# Patient Record
Sex: Male | Born: 1940 | Race: Black or African American | Hispanic: No | Marital: Married | Smoking: Current some day smoker
Health system: Southern US, Community
[De-identification: ages and names within clinical notes are randomized; demographics above are authoritative.]

## PROBLEM LIST (undated history)

## (undated) DIAGNOSIS — I2510 Atherosclerotic heart disease of native coronary artery without angina pectoris: Secondary | ICD-10-CM

## (undated) DIAGNOSIS — J40 Bronchitis, not specified as acute or chronic: Secondary | ICD-10-CM

## (undated) DIAGNOSIS — E119 Type 2 diabetes mellitus without complications: Secondary | ICD-10-CM

## (undated) DIAGNOSIS — E785 Hyperlipidemia, unspecified: Secondary | ICD-10-CM

## (undated) DIAGNOSIS — I739 Peripheral vascular disease, unspecified: Secondary | ICD-10-CM

## (undated) DIAGNOSIS — M1990 Unspecified osteoarthritis, unspecified site: Secondary | ICD-10-CM

## (undated) DIAGNOSIS — I10 Essential (primary) hypertension: Secondary | ICD-10-CM

## (undated) HISTORY — DX: Peripheral vascular disease, unspecified: I73.9

## (undated) HISTORY — DX: Bronchitis, not specified as acute or chronic: J40

## (undated) HISTORY — DX: Atherosclerotic heart disease of native coronary artery without angina pectoris: I25.10

## (undated) HISTORY — DX: Hyperlipidemia, unspecified: E78.5

## (undated) HISTORY — DX: Unspecified osteoarthritis, unspecified site: M19.90

## (undated) HISTORY — DX: Essential (primary) hypertension: I10

## (undated) HISTORY — PX: CORONARY ARTERY BYPASS GRAFT: SHX141

---

## 1999-05-29 HISTORY — PX: OTHER SURGICAL HISTORY: SHX169

## 2001-02-11 ENCOUNTER — Ambulatory Visit (HOSPITAL_COMMUNITY): Admission: RE | Admit: 2001-02-11 | Discharge: 2001-02-11 | Payer: Self-pay | Admitting: *Deleted

## 2002-03-20 ENCOUNTER — Encounter: Payer: Self-pay | Admitting: Cardiothoracic Surgery

## 2002-03-20 ENCOUNTER — Inpatient Hospital Stay (HOSPITAL_COMMUNITY): Admission: AD | Admit: 2002-03-20 | Discharge: 2002-03-26 | Payer: Self-pay | Admitting: Cardiology

## 2002-03-21 ENCOUNTER — Encounter: Payer: Self-pay | Admitting: Cardiothoracic Surgery

## 2002-03-22 ENCOUNTER — Encounter: Payer: Self-pay | Admitting: Cardiothoracic Surgery

## 2002-03-23 ENCOUNTER — Encounter: Payer: Self-pay | Admitting: Cardiothoracic Surgery

## 2002-03-24 ENCOUNTER — Encounter: Payer: Self-pay | Admitting: Cardiothoracic Surgery

## 2002-04-09 ENCOUNTER — Emergency Department (HOSPITAL_COMMUNITY): Admission: EM | Admit: 2002-04-09 | Discharge: 2002-04-09 | Payer: Self-pay | Admitting: Emergency Medicine

## 2002-04-10 ENCOUNTER — Emergency Department (HOSPITAL_COMMUNITY): Admission: EM | Admit: 2002-04-10 | Discharge: 2002-04-10 | Payer: Self-pay | Admitting: Emergency Medicine

## 2002-04-14 ENCOUNTER — Encounter: Admission: RE | Admit: 2002-04-14 | Discharge: 2002-04-14 | Payer: Self-pay | Admitting: Cardiothoracic Surgery

## 2002-04-14 ENCOUNTER — Encounter: Payer: Self-pay | Admitting: Cardiothoracic Surgery

## 2003-12-13 ENCOUNTER — Ambulatory Visit (HOSPITAL_COMMUNITY): Admission: RE | Admit: 2003-12-13 | Discharge: 2003-12-14 | Payer: Self-pay | Admitting: Cardiology

## 2007-06-29 ENCOUNTER — Encounter: Admission: RE | Admit: 2007-06-29 | Discharge: 2007-06-29 | Payer: Self-pay | Admitting: Cardiology

## 2007-07-04 ENCOUNTER — Inpatient Hospital Stay (HOSPITAL_COMMUNITY): Admission: RE | Admit: 2007-07-04 | Discharge: 2007-07-05 | Payer: Self-pay | Admitting: Cardiology

## 2008-05-01 ENCOUNTER — Encounter: Admission: RE | Admit: 2008-05-01 | Discharge: 2008-05-01 | Payer: Self-pay | Admitting: Cardiology

## 2008-05-07 ENCOUNTER — Ambulatory Visit (HOSPITAL_COMMUNITY): Admission: RE | Admit: 2008-05-07 | Discharge: 2008-05-07 | Payer: Self-pay | Admitting: Cardiology

## 2008-05-28 ENCOUNTER — Ambulatory Visit: Payer: Self-pay | Admitting: *Deleted

## 2008-05-28 ENCOUNTER — Encounter (INDEPENDENT_AMBULATORY_CARE_PROVIDER_SITE_OTHER): Payer: Self-pay | Admitting: *Deleted

## 2008-05-28 ENCOUNTER — Inpatient Hospital Stay (HOSPITAL_COMMUNITY): Admission: RE | Admit: 2008-05-28 | Discharge: 2008-05-30 | Payer: Self-pay | Admitting: *Deleted

## 2008-05-29 ENCOUNTER — Encounter (INDEPENDENT_AMBULATORY_CARE_PROVIDER_SITE_OTHER): Payer: Self-pay | Admitting: *Deleted

## 2008-06-21 ENCOUNTER — Ambulatory Visit: Payer: Self-pay | Admitting: *Deleted

## 2008-12-13 ENCOUNTER — Ambulatory Visit: Payer: Self-pay | Admitting: *Deleted

## 2009-06-13 ENCOUNTER — Ambulatory Visit: Payer: Self-pay | Admitting: Vascular Surgery

## 2010-02-19 ENCOUNTER — Ambulatory Visit: Payer: Self-pay | Admitting: Vascular Surgery

## 2010-04-03 ENCOUNTER — Encounter (INDEPENDENT_AMBULATORY_CARE_PROVIDER_SITE_OTHER): Payer: Self-pay | Admitting: *Deleted

## 2010-04-03 LAB — CONVERTED CEMR LAB
Alkaline Phosphatase: 86 units/L (ref 39–117)
BUN: 10 mg/dL (ref 6–23)
Basophils Relative: 0 % (ref 0–1)
Eosinophils Absolute: 0.1 10*3/uL (ref 0.0–0.7)
Eosinophils Relative: 1 % (ref 0–5)
Glucose, Bld: 108 mg/dL — ABNORMAL HIGH (ref 70–99)
HCT: 46 % (ref 39.0–52.0)
HDL: 42 mg/dL (ref >39)
LDL Cholesterol: 182 mg/dL — ABNORMAL HIGH (ref 0–99)
Lymphs Abs: 2.1 10*3/uL (ref 0.7–4.0)
MCHC: 33.7 g/dL (ref 30.0–36.0)
MCV: 90.7 fL (ref 78.0–100.0)
Monocytes Relative: 9 % (ref 3–12)
Platelets: 231 10*3/uL (ref 150–400)
Sodium: 141 meq/L (ref 135–145)
Total Bilirubin: 0.3 mg/dL (ref 0.3–1.2)
Triglycerides: 109 mg/dL (ref <150)
VLDL: 22 mg/dL (ref 0–40)
WBC: 5.2 10*3/uL (ref 4.0–10.5)

## 2010-08-11 LAB — COMPREHENSIVE METABOLIC PANEL
Albumin: 4.1 g/dL (ref 3.5–5.2)
BUN: 8 mg/dL (ref 6–23)
Creatinine, Ser: 1.09 mg/dL (ref 0.4–1.5)
Total Bilirubin: 0.7 mg/dL (ref 0.3–1.2)
Total Protein: 7.2 g/dL (ref 6.0–8.3)

## 2010-08-11 LAB — URINALYSIS, ROUTINE W REFLEX MICROSCOPIC
Bilirubin Urine: NEGATIVE
Nitrite: NEGATIVE
Specific Gravity, Urine: 1.011 (ref 1.005–1.030)
Urobilinogen, UA: 1 mg/dL (ref 0.0–1.0)

## 2010-08-11 LAB — CBC
HCT: 41.5 % (ref 39.0–52.0)
MCHC: 33.1 g/dL (ref 30.0–36.0)
MCV: 92.8 fL (ref 78.0–100.0)
Platelets: 214 10*3/uL (ref 150–400)
RDW: 14.4 % (ref 11.5–15.5)

## 2010-08-11 LAB — TYPE AND SCREEN: Antibody Screen: NEGATIVE

## 2010-08-11 LAB — PROTIME-INR: INR: 1 (ref 0.00–1.49)

## 2010-08-11 LAB — APTT: aPTT: 32 seconds (ref 24–37)

## 2010-08-12 LAB — GLUCOSE, CAPILLARY
Glucose-Capillary: 101 mg/dL — ABNORMAL HIGH (ref 70–99)
Glucose-Capillary: 108 mg/dL — ABNORMAL HIGH (ref 70–99)
Glucose-Capillary: 86 mg/dL (ref 70–99)
Glucose-Capillary: 99 mg/dL (ref 70–99)

## 2010-08-12 LAB — CBC
HCT: 33.5 % — ABNORMAL LOW (ref 39.0–52.0)
Hemoglobin: 10.7 g/dL — ABNORMAL LOW (ref 13.0–17.0)
Platelets: 176 10*3/uL (ref 150–400)
RBC: 3.44 MIL/uL — ABNORMAL LOW (ref 4.22–5.81)
WBC: 10.9 10*3/uL — ABNORMAL HIGH (ref 4.0–10.5)
WBC: 12.4 10*3/uL — ABNORMAL HIGH (ref 4.0–10.5)

## 2010-08-12 LAB — BASIC METABOLIC PANEL
Calcium: 7.9 mg/dL — ABNORMAL LOW (ref 8.4–10.5)
GFR calc Af Amer: >60 mL/min (ref >60)
GFR calc non Af Amer: >60 mL/min (ref >60)
Sodium: 134 mEq/L — ABNORMAL LOW (ref 135–145)

## 2010-08-12 LAB — HEMOGLOBIN A1C: Mean Plasma Glucose: 146 mg/dL

## 2010-08-20 ENCOUNTER — Encounter (INDEPENDENT_AMBULATORY_CARE_PROVIDER_SITE_OTHER): Payer: PRIVATE HEALTH INSURANCE

## 2010-08-20 DIAGNOSIS — Z48812 Encounter for surgical aftercare following surgery on the circulatory system: Secondary | ICD-10-CM

## 2010-08-20 DIAGNOSIS — I739 Peripheral vascular disease, unspecified: Secondary | ICD-10-CM

## 2010-09-09 NOTE — Procedures (Signed)
BYPASS GRAFT EVALUATION   INDICATION:  Followup fem-fem bypass graft.   HISTORY:  Diabetes:  Yes.  Cardiac:  CABG.  Hypertension:  Yes.  Smoking:  Yes.  Previous Surgery:  Femoral-femoral (right to left) bypass graft and left  profundoplasty 05/28/2008 by Dr. Madilyn Fireman.   SINGLE LEVEL ARTERIAL EXAM                               RIGHT              LEFT  Brachial:                    136                137  Anterior tibial:             76                 69  Posterior tibial:            86                 70  Peroneal:  Ankle/brachial index:        0.63               0.51   PREVIOUS ABI:  Date:  12/13/2008  RIGHT:  0.63  LEFT:  0.53   LOWER EXTREMITY BYPASS GRAFT DUPLEX EXAM:   DUPLEX:  Patent femoral-femoral bypass graft with elevated velocities in  the right CFA.  Evidence of bilateral SFA occlusions.   IMPRESSION:  1. Patent right to left femoral-femoral bypass graft.  2. Elevated velocities in right common femoral artery.  3. Evidence of bilateral superficial femoral artery occlusions.  4. Stable ankle brachial indices bilaterally.  5. Dr. Madilyn Fireman stated to keep on standard protocol.   ___________________________________________  Di Kindle. Edilia Bo, M.D.   AS/MEDQ  D:  12/13/2008  T:  12/13/2008  Job:  [id number]

## 2010-09-09 NOTE — Op Note (Signed)
NAMECARVEL, HUSKINS                ACCOUNT NO.:  [id number]   MEDICAL RECORD NO.:  [id number]          PATIENT TYPE:  INP   LOCATION:  3308                         FACILITY:  [facility]   PHYSICIAN:  Balinda Quails, M.D.    DATE OF BIRTH:  1940-08-10   DATE OF PROCEDURE:  05/28/2008  DATE OF DISCHARGE:                               OPERATIVE REPORT   SURGEON:  Balinda Quails, MD   ASSISTANT:  Jerold Coombe, PA   ANESTHETIC:  General endotracheal.   ANESTHESIOLOGIST:  Bedelia Person, MD   PREOPERATIVE DIAGNOSIS:  Left lower extremity claudication.   POSTOPERATIVE DIAGNOSIS:  Left lower extremity claudication.   PROCEDURE:  Right-to-left femoral-femoral bypass with left  profundoplasty.   CLINICAL NOTE:  Adger Cantera is a 70 year old male with known peripheral  vascular disease.  He has history of bilateral iliac stenting  procedures.  Now has an occluded left iliac system.  Severe claudication  of left lower extremity.  Brought to the operating room at this time for  planned right-to-left femoral-femoral bypass.   OPERATIVE PROCEDURE:  The patient was brought to the operating room  stable condition.  Placed under general endotracheal anesthesia.  In a  supine position, the abdomen and both legs were prepped and draped in  sterile fashion.   Bilateral longitudinal groin skin incisions were made at the inguinal  ligament.  Dissection was carried down through the subcutaneous tissue  using electrocautery.  The lymphatics were divided electrocautery.   Right groin dissection revealed the common femoral artery to be  moderately diseased with calcified plaque, strong pulse.  The common  femoral artery encircled at the inguinal ligament.  Distal dissection  carried down to the origin of the profunda and superficial femoral  arteries which were freed and encircled with vessel loops.   The left groin revealed an absent femoral pulse.  Left common femoral  artery severely diseased.   Dissection carried down to expose a bifid  profunda system.  The 2 profunda branches were both dissected out and  encircled with vessel loops.   A suprapubic subcutaneous tunnel created between the 2 incisions.  A 7-  mm Dacron graft was placed through the tunnel.   The patient was administered 5000 units of heparin intravenously.  The  right femoral vessels were controlled with clamps.  Longitudinal  arteriotomy was made in the distal right common femoral artery.  The  arteriotomy extended out across the origin of the right superficial  femoral artery.  The graft was beveled and anastomosed end-to-side to  the right common femoral artery using running 5-0 Prolene suture.  At  the completion of this, graft flushed, clamps removed, excellent flow  present, and controlled with a fistula clamp.   Left femoral vessels were then controlled with clamps.  The superficial  branch of left profunda femoris artery was opened longitudinally.  The  arteriotomy extended proximally up into the common femoral artery.  Severe plaque was present and this was endarterectomized back to the  second branch of the profunda femoris artery.  Both major branches of  profunda  femoris were therefore open.  The left limb of the graft was  beveled and anastomosed end-to-side to left common femoral artery out on  the left profunda femoris artery using running 6-0 Prolene suture.  At  the completion of this, the graft flushed and the vessels flushed.  Clamps were then removed.  Excellent flow present.   Adequate hemostasis was obtained.  Sponge and instrument counts were  correct.   The groin incisions were then closed with running 2-0 Vicryl suture in  two subcutaneous layers, skin closed with Monocryl and Dermabond.   No apparent complications.  The patient transferred to recovery room in  stable condition.      Balinda Quails, M.D.  Electronically Signed     PGH/MEDQ  D:  05/28/2008  T:  05/28/2008   Job:  [id number]   cc:   [name] [name], MD

## 2010-09-09 NOTE — Discharge Summary (Signed)
NAMEBAWI, LAKINS                ACCOUNT NO.:  [id number]   MEDICAL RECORD NO.:  [id number]          PATIENT TYPE:  INP   LOCATION:  2028                         FACILITY:  [facility]   PHYSICIAN:  Balinda Quails, M.D.    DATE OF BIRTH:  1940/11/24   DATE OF ADMISSION:  05/28/2008  DATE OF DISCHARGE:  05/30/2008                               DISCHARGE SUMMARY   FINAL DISCHARGE DIAGNOSES:  1. Peripheral vascular disease resulting in ischemic left lower      extremity with claudication.  2. Hypertension.  3. Dyslipidemia.   PROCEDURES PERFORMED:  Right-to-left femoral-femoral artery bypass  grafting using 7-mm Hemashield Dacron graft with left profundoplasty by  Dr. Madilyn Fireman on May 28, 2008.   COMPLICATIONS:  None.   CONDITION AT DISCHARGE:  Stable and improving.   DISCHARGE MEDICATIONS:  He is instructed to resume all previous  medications consisting of:  1. Lipitor 80 mg p.o. daily.  2. Terazosin 5 mg p.o. daily.  3. Lisinopril 10 mg p.o. daily.  4. Coreg 25 mg p.o. b.i.d.  5. Plavix 75 mg p.o. daily.  6. Aspirin 325 mg p.o. daily.  7. Benicar/hydrochlorothiazide 40/12.5 mg p.o. daily.  8. Nexium 40 mg p.o. daily.   He is also given a prescription for:  1. Percocet 5/325 one p.o. q.4 h. p.r.n. pain, total #20 were given.   DISPOSITION:  He is being discharged home in stable condition with his  wound healing well.  He is given careful instructions regarding his diet  and activity level and the care of his wounds.  He is given an  appointment with Dr. Madilyn Fireman in 2 weeks for followup and ABIs.  The office  will arrange a visit.   BRIEF IDENTIFYING STATEMENT:  For complete details, please refer the  typed history and physical.  Briefly, this very pleasant 70 year old  gentleman was referred to Dr. Madilyn Fireman for left lower extremity  claudication.  Dr. Madilyn Fireman evaluated him and recommended a right-to-left  femoral-femoral bypass grafting.  He was informed of the risks and  benefits  of the procedure and after careful consideration elected to  proceed with surgery.   HOSPITAL COURSE:  Preoperative workup was completed as an outpatient.  He was brought in through the same day surgery and underwent the  aforementioned revascularization procedure.  For complete details,  please refer the typed operative report.  The procedure was without  complications, and he was returned to the post anesthesia care unit  extubated.  Following stabilization, he was transferred to a bed on a  surgical step-down unit.  The following morning, he was felt stable and  was able to be transferred to a bed on a surgical convalescent floor.   His diet and activity level was increased as tolerated.  He was walking  and performing his activities of daily living.  He was felt stable and  was discharged home in stable condition on May 30, 2008.      Wilmon Arms, PA      P. Liliane Bade, M.D.  Electronically Signed    KEL/MEDQ  D:  05/30/2008  T:  05/30/2008  Job:  [id number]

## 2010-09-09 NOTE — Cardiovascular Report (Signed)
Cristian Hayden, Cristian Hayden                ACCOUNT NO.:  [id number]   MEDICAL RECORD NO.:  [id number]          PATIENT TYPE:  INP   LOCATION:  2807                         FACILITY:  [facility]   PHYSICIAN:  [name] [name], MD       DATE OF BIRTH:  [birth year]   DATE OF PROCEDURE:  07/04/2007  DATE OF DISCHARGE:                            CARDIAC CATHETERIZATION   PROCEDURE:  1. Abdominal aortogram.  2. Distal abdominal aortogram with bifemoral runoff.  3. PTA and balloon angioplasty and stenting of the left common and      left external iliac artery.  4. Left iliac arteriogram.   INDICATIONS:  [name] is a 70 year old gentleman with known  prior coronary artery disease and history of known peripheral arterial  disease.  He has previously undergone bypass surgery in 2003 and also in  2003 was found to have bilateral SFA occlusions and high-grade stenosis  of the iliac arteries.  He was being medically managed, but because of  increasing symptoms of claudication and also significant change in his  ABI, he is brought back to the catheterization lab to evaluate his  peripheral anatomy.  His ABI on the left was 0.4 and right was 0.5.   ABDOMINAL AORTOGRAM:  Abdominal aortogram revealed the presence of two  renal arteries, one on either side.  They were widely patent.  The  aortoiliac bifurcation was widely patent.   DISTAL ABDOMINAL AORTOGRAM:  Distal abdominal aortogram revealed the  iliac artery on the left to have a high-grade 80% stenosis, long  segment.  This was followed by a 90% focal stenosis of the distal  external iliac artery.  The left common femoral artery and the profunda  femoral artery showed a 70-80% stenosis.  There was a very large  collateral that arose just at the distal external iliac artery and  supplied a large part of the hip joint.   The left profunda itself gave a collateral to the lower extremities, and  the left SFA is occluded followed by a skip occlusion of  the popliteal  artery and reconstitution by means of collateral.  There appeared to be  two-vessel runoff noted in the left lower extremity below the left knee.   The right iliac artery, right common iliac artery, and internal iliac  artery were patent.  The right external iliac artery showed diffuse  luminal irregularity, and the right external iliac artery showed a 60 to  at most 70% external iliac artery stenosis which was diffusely diseased  and a 70-80% stenosis at the right profunda femoral artery.   The profunda femoral artery gave lateral to the distal SFA, and there  was three-vessel runoff noted in the right lower extremity.   INTERVENTIONAL DATA:  Successful PTA and balloon angioplasty and  stenting of the left common and external iliac artery utilizing one long  9.0 x 80-mm self-expanding Protege stent.  This stent was pre and post-  dilated with 8.0 x 80-mm Powerflex balloon at a peak of 10 atmospheres  of pressure.  Overall, the stenosis was reduced from 80-90% to  0%.   There was no compromise of either the profunda or the large collateral  branch that arose at the distal external iliac artery.  The stent did  cover this large collateral without any compromise.   RECOMMENDATIONS:  There is still a persistent left femoral artery  stenosis which is 70-80% and a left profunda stenosis of 80%.  Given his  markedly decreased flow through the left lower extremity and markedly  abnormal ABI, he will greatly benefit from patch angioplasty of the  common femoral artery and profunda femoral artery.  I have discussed  these findings with Dr. Durene Cal, and he will review the films.  If  he does feel that the patient is going to benefit, this can be done in  the elective outpatient basis at a later date.   As far as the right lower extremity is concerned, we could potentially  consider repeating his angiography to reevaluate the significance of  right external iliac artery  stenosis and the profunda stenosis and  possible either stenting or hybrid procedure, i.e., stenting and  surgical procedure can be contemplated.  We will decide this depending  on the patient's symptomatology and followup.   He will be discharged home in the morning if he remains stable.   TECHNIQUE OF THE PROCEDURE:  Under the usual sterile precautions using a  5-French left femoral arterial access, a Wholey wire was utilized and a  pigtail catheter was advanced to the abdominal aorta, and abdominal  aortogram was performed.  The same catheter was pulled back into the  distal abdominal aorta, and distal abdominal aortogram was performed.  Then a distal abdominal aortogram with bifemoral runoff was also  performed.  Using the left femoral arterial access exposing the left  external iliac artery, arterial injection was performed and follow-  through of the left femoral artery was performed on the left side.   After eventually using heparin for anticoagulation, I reviewed the  anatomy closely, and it was a very complex anatomy.  We very carefully  measured the lesion length and performed a balloon angioplasty with a  8.0 x 80-mm Powerflex balloon at 10 atmospheres pressure for 70 seconds  and 4 atmospheres of pressure for 30 seconds.  Having performed this,  angiography revealed significant dissection in the external iliac  artery, and hence, I decided to proceed with a stent implantation with a  9.0 x 80-mm Protege stent which nicely covered both the common iliac and  distal external iliac artery stenosis, and the stent was postdilated  with the 8.0 x 80-mm Powerflex balloon at 10 atmospheres of pressure in  the proximal segment and 2 atmospheres of pressure in the distal  segment, and angiography revealed excellent results.  The wire was  withdrawn, and the patient was transferred to the holding area in stable  condition.  The patient tolerated the procedure well.  No immediate   complications were noted.  end of dictation.      [name] [name], MD  Electronically Signed     JRG/MEDQ  D:  07/04/2007  T:  07/05/2007  Job:  [id number]   cc:   Renaye Rakers, M.D.

## 2010-09-09 NOTE — Cardiovascular Report (Signed)
NAME:  Cristian Hayden, Cristian Hayden                ACCOUNT NO.:  [id number]   MEDICAL RECORD NO.:  [id number]          PATIENT TYPE:  AMB   LOCATION:  SDS                          FACILITY:  [facility]   PHYSICIAN:  Vonna Kotyk R. [name], MD       DATE OF BIRTH:  [birth year]   DATE OF PROCEDURE:  05/07/2008  DATE OF DISCHARGE:  05/07/2008                            CARDIAC CATHETERIZATION   PROCEDURE PERFORMED:  1. Pelvic aortogram with bifemoral runoff.  2. Placement of a catheter tip into the left common femoral artery.  3. Right femoral arteriogram with distal runoff.   INDICATIONS:  [name] is a 70 year old gentleman with known  coronary artery disease and status post CABG.  He also has smoking  history.  He has been having severe lifestyle-limiting claudication.  His ABI had been markedly reduced more on the left than on the right and  has been having lifestyle-limiting claudication.  He had undergone PTA  and stenting of the left external and common iliac artery on July 04, 2007.  He had a high-grade stenosis of the left profunda femoral artery.  He was now brought back to the PV angiography suite to reevaluate his  peripheral anatomy given increasing symptoms of claudication and to  evaluate whether percutaneous or surgical intervention is appropriate.   ANGIOGRAPHIC DATA:  Left iliac arterial system.  The left common iliac  artery is widely patent.  The left external iliac artery stent is also  widely patent.  The left internal iliac artery is occluded.   Left common femoral artery and left profunda femoral artery which was  large, they were occluded and this is new from March 2009.  However,  this profunda is collateralized by a large collateral and the profunda  itself is very large and supplies collaterals to the this distal  superficial femoral artery.  However, this superficial femoral artery  has tandem 100% occlusions including occlusion of the left popliteal  artery.   Below the left  knee, the tibioperoneal trunk is collateralized by  profunda femoral artery and there is two-vessel runoff in the left lower  extremity and the anterior tibial arteries are occluded.   Right iliac artery and right lower extremity.  The right iliac artery  showed mild luminal irregularity, again calcification was noted.  Right  external and common femoral artery showed mild 50% to at most 60%  stenosis unchanged from prior angiography.  The right superficial  femoral artery at its origin is occluded.  The right distal SFA is  collateralized by the profunda femoral artery.  There is three-vessel  runoff noted below the right knee.   IMPRESSION AND PLAN:  [name] has progressed his peripheral arterial  disease.  Now, the left common femoral artery and left profunda femoral  artery at the origin 2-3 weeks are now occluded.  However, there is  still excellent collateralization of the profunda femoral artery and  this gives collaterals to the tibioperoneal trunk below the left knee  while there is two-vessel runoff.  On the right side, the anatomy is  unchanged with  occluded right superficial femoral artery and the right  profunda femoral artery gives collaterals to the distal superficial  femoral artery.  He would benefit from revascularization and this is a  surgical anatomy.  I will discuss the findings with Dr. Liliane Bade.  Either an aortobiprofunda bypass or is able to perform left femoral  endarterectomy and left profundoplasty that would be another ideal  situation.  However, I will discuss this with Dr. Liliane Bade and make  further recommendations.   Total of 95 mL of contrast was utilized for diagnostic angiography.   TECHNIQUE OF PROCEDURE:  Usual sterile precautions using a 5-French  right femoral arterial access, a 5-French Omniflush catheter was  advanced into abdominal and pelvic aortogram was performed.  Using the  same catheter, I was able to crossover into the left common  iliac artery  and pass the wire and a end-hole catheter was placed into the left  common femoral artery.  Angiography of the left lower extremity was  performed.  Then, the same catheter was pulled out of the body.  Right  femoral arteriography was performed through the arterial access sheath.  The patient tolerated the procedure well.  No immediate complications.      [name] [name], MD  Electronically Signed     JRG/MEDQ  D:  05/07/2008  T:  05/08/2008  Job:  [id number]   cc:   Balinda Quails, M.D.

## 2010-09-09 NOTE — Assessment & Plan Note (Signed)
OFFICE VISIT   JOVAUN, LEVENE  DOB:  [birth year]                                       06/21/2008  [id number]#:[id number]   The patient underwent right-to-left fem-fem bypass with left  profundoplasty May 28, 2008, at [facility].   He is doing well since his surgery.  No complaints at this time.  Improved left lower extremity claudication symptoms.   Ankle brachial indices 0.63 on the right, 0.53 on the left.   Incisions are healing well.  Fem-fem graft patent.   Plan follow-up per protocol.  Answered all questions at this time.   Balinda Quails, M.D.  Electronically Signed   PGH/MEDQ  D:  06/21/2008  T:  06/22/2008  Job:  [id number]   cc:   [name] [name], MD

## 2010-09-09 NOTE — Procedures (Signed)
BYPASS GRAFT EVALUATION   INDICATION:  Follow up femoral-femoral bypass graft.   HISTORY:  Diabetes:  Yes.  Cardiac:  CABG.  Hypertension:  Yes.  Smoking:  Yes.  Previous Surgery:  Right-to-left femoral-femoral bypass graft and left  profundoplasty, 05/28/08.   SINGLE LEVEL ARTERIAL EXAM                               RIGHT              LEFT  Brachial:                    159                155  Anterior tibial:             97                 76  Posterior tibial:            76                 79  Peroneal:  Ankle/brachial index:        0.61               0.50   PREVIOUS ABI:  Date:  12/13/08  RIGHT:  0.63  LEFT:  0.51   LOWER EXTREMITY BYPASS GRAFT DUPLEX EXAM:   DUPLEX:  Patent right-to-left femoral-femoral bypass graft with elevated  velocity of the right common femoral artery and right proximal  anastomosis.  Bilateral superficial femoral arteries appear occluded.   IMPRESSION:  1. Stable ankle brachial indices bilaterally.  2. Stable femoral-femoral bypass graft with elevated velocities, as      described above.  3. Occlusion of bilateral superficial femoral arteries.     ___________________________________________  Di Kindle. Edilia Bo, M.D.   CB/MEDQ  D:  06/14/2009  T:  06/14/2009  Job:  [id number]

## 2010-09-09 NOTE — Assessment & Plan Note (Signed)
OFFICE VISIT   Cristian, Hayden  DOB:  1940/11/06                                       02/19/2010  [id number]#:[id number]   I saw the patient in the office today for continued followup of his  peripheral vascular disease.  He had undergone a right to left fem-fem  bypass graft by Dr. Madilyn Fireman in February 2010.  He comes in for continued  followup.  He continues to have some mild claudication in both calves,  which is equal on both sides.  His pain is brought on by ambulation and  relieved with rest.  There are no other aggravating or alleviating  factors.  He has had no rest pain and no history of nonhealing ulcers.   PAST MEDICAL HISTORY:  Significant for hypertension which has been  poorly-controlled, as he has not been taking his blood pressure  medications.  He states that he has not been able to afford these.  In  addition, the patient has a history of hyperlipidemia.   REVIEW OF SYSTEMS:  CARDIOVASCULAR:  He has had no chest pain, chest  pressure, palpitations or arrhythmias.  He has had no history of stroke  or TIAs.  He has had no history of DVT or phlebitis.  NEUROLOGIC:  He does have occasional dizziness.  PULMONARY:  He has had no productive cough, bronchitis, asthma or  wheezing.   PHYSICAL EXAMINATION:  This is a pleasant 70 year old gentleman who  appears his stated age.  Blood pressure is 203/100, heart rate is 65,  respiratory rate is 12.  Lungs are clear bilaterally to auscultation  without rales, rhonchi or wheezing.  On cardiovascular exam I do not  detect any carotid bruits.  He has a regular rate and rhythm.  He has  palpable femoral pulses and a palpable fem-fem graft pulse.  I cannot  palpate popliteal or pedal pulses on either side.  Abdomen is soft,  nontender, with normal-pitched bowel sounds.  Neurologic:  He has no  focal weakness or paresthesias.   I have independently interpreted his arterial Doppler study, which shows  monophasic  Doppler signals in the dorsalis pedis and posterior tibial  positions bilaterally.  ABI on the right is 59% and on the left 46%.  These ABIs are stable compared to the study in February of this year.   On social history, the patient does continue to smoke a pack per week of  cigarettes.  He had smoked up to a pack per day in the past.  We again  discussed the importance of tobacco cessation.  We will follow his graft  on our protocol and I will plan on seeing him back in 18 months unless  he calls sooner.  We have also given him a number to try to get some  assistance with his medications.  I have ordered a followup Doppler  study so we can continue to follow this graft.     Di Kindle. Edilia Bo, M.D.  Electronically Signed   CSD/MEDQ  D:  02/19/2010  T:  02/20/2010  Job:  [id number]

## 2010-09-09 NOTE — Discharge Summary (Signed)
NAMERAMEEN, QUINNEY                ACCOUNT NO.:  [id number]   MEDICAL RECORD NO.:  [id number]          PATIENT TYPE:  INP   LOCATION:  3735                         FACILITY:  [facility]   PHYSICIAN:  [name] [name], MD       DATE OF BIRTH:  [birth year]   DATE OF ADMISSION:  07/04/2007  DATE OF DISCHARGE:                               DISCHARGE SUMMARY   [name] is a 70 year old African American male patient of Dr. [name],  who has claudication in his lower extremities.  He had peripheral  Dopplers January 16 that revealed ABI of 0.5 on the right, 0.4 on the  left, with marked elevated velocities.  Thus, he was brought in for PV  angiography.  It was performed by Dr. [name].  He was found to have a  high-grade stenosis in his left iliac.  He underwent stenting with a  Protege GPS 9 x 80 to the left iliac.  He had complex anatomy per Dr.  Verl Dicker preliminary results.  He recommended a left femoral and profunda  patch angioplasty and questionable right iliac stenting or further  evaluation at a later date,  he had planned to talk to Dr. Myra Gianotti about  his disease.  Please see Dr. Verl Dicker complete the PV angiogram report  for complete details.  On the morning of July 05, 2007, he was seen by  Dr. Nanetta Batty and considered stable for discharge home.  His blood  pressure was 135/77.   LABS:  Hemoglobin 12.6, hematocrit 37.1, platelets 240, WBC 7.5.  Sodium  133, potassium 3.8, BUN 6, creatinine 1.08.  His glucose was 106.  His  groin was without bruits or hematoma.   DISCHARGE MEDICATIONS:  1. Avalide 300/12.5 mg every day.  2. Lisinopril 10 mg every day.  3. Carvedilol 25 mg b.i.d.  4. Plavix 75 mg a day.  5. Lipitor 80 mg at bedtime.  6. Terazosin 5 mg every day.  7. Zegerid 40 mg every day.  8. Aspirin 81 mg every day.   He was told not to do any strenuous activity, lifting, pushing, pulling  or extended walking for 1 week.  If he has any problems with his groin,  he will give our  office a call.  Our office will call him with a follow-  up plan with Dr. [name].   DISCHARGE DIAGNOSES:  1. Claudication.  2. Abnormal ankle brachial indices and high velocities by Doppler of      his lower extremities.  3. Atherosclerotic peripheral vascular disease, status post peripheral      vascular angiogram showing complex stenosis in his iliac, femorals      and profunda.  He underwent left external iliac stenting with a      Protege 9.0 by 80 by Dr. [name].  Further evaluation will be done as      an outpatient for the rest of his disease.  4. Hypertension  5. Tobacco use.  6. Atherosclerotic cardiovascular disease, status post coronary bypass      grafting November 2003 with a left internal mammary artery to  his      left anterior descending artery, a saphenous vein graft to his      posterior      descending artery, a saphenous vein graft to his obtuse marginal #1      and obtuse marginal #2.  7. Hypertension.  8. Hyperlipidemia.      Cristian Hayden, N.P.      [name] [name], MD  Electronically Signed    BB/MEDQ  D:  07/05/2007  T:  07/06/2007  Job:  [id number]   cc:   Cristian Hayden, M.D.  Cristian Ny, MD

## 2010-09-09 NOTE — H&P (Signed)
NAME:  Cristian Hayden, Cristian Hayden                ACCOUNT NO.:  [id number]   MEDICAL RECORD NO.:  [id number]          PATIENT TYPE:  AMB   LOCATION:  SDS                          FACILITY:  [facility]   PHYSICIAN:  Vonna Kotyk R. [name], MD       DATE OF BIRTH:  [birth year]   DATE OF ADMISSION:  05/07/2008  DATE OF DISCHARGE:                              HISTORY & PHYSICAL   CHIEF COMPLAINT:  Claudication.   HISTORY OF PRESENT ILLNESS:  Cristian Hayden is a 70 year old male followed by  Dr. [name] and Dr. Renaye Rakers.  He has a history of coronary disease and  vascular disease.  He had right common internal artery stenting and  right external iliac artery angioplasty in March 2009.  He had residual  total left SFA.  He also had a 70-80% right SFA.  He continues to have  claudication.  He saw Dr. [name] in the office April 04, 2008.  His  Dopplers from November 2009 revealed an ABI of 0.54 on the right and  unobtainable on the left.  The patient's symptoms are significantly  lifestyle limiting.  He is admitted now for peripheral angiogram by Dr.  [name].  Please see Dr. Verl Dicker office note from April 04, 2008 for  complete details.   PAST MEDICAL HISTORY:  Is remarkable for:  1. Coronary disease, he had coronary artery bypass grafting x4 in 2003      with LIMA to the LAD, SVG to the PDA, SVG to the OM1 and OM-2      sequentially.  His last Myoview was October 2009, was low risk with      good LV function.  Echocardiogram in October 2009 showed an EF of      50-55% with a suggestion of impaired LV relaxation.  There was      moderate aortic valve sclerosis, but no aortic stenosis.  2. There is a history of hypertension which is treated.  \  3. He has dyslipidemia.  4. There is a mention of a history of diabetes but the patient is not      on insulin or oral agents.  5. He does have some BPH which is minimally symptomatic.  6. He has a history of glaucoma and takes drops for this.   HIS CURRENT MEDICATIONS ARE:   Lipitor 40 mg a day, bupropion 150 mg a  day, terazosin 5 mg a day, lisinopril 10 mg a day, Coreg 25 mg b.i.d.,  Zegerid, Avalide 300/12.5 daily, Plavix 75 mg a day.   He has no known drug allergies.   SOCIAL HISTORY:  He is married.  He has 4 children, 1 grandchild.  He is  a retired Estate agent.  He does smoke occasionally.   REVIEW OF SYSTEMS:  Essentially unremarkable except for as noted above.  He denies any GI bleeding or melena.  He wears glasses.   PHYSICAL EXAM:  Blood pressure 127/73, pulse 52, temperature 97.  GENERAL:  He is a well-developed, well-nourished male in no acute  distress.  HEENT:  Normocephalic, atraumatic.  Extraocular movements are intact,  sclerae are anicteric.  He wears glasses.  NECK:  Without JVD or bruit.  CHEST:  Clear to percussion and auscultation.  CARDIAC EXAM:  Reveals regular rate and rhythm with a soft systolic  murmur at the aortic valve area.  Normal S1 and S2.  ABDOMEN:  Nontender.  No hepatosplenomegaly.  No bruits.  EXTREMITIES:  Without edema, he has bifemoral bruits.  NEURO EXAM:  Grossly intact.  He is awake, alert and oriented and  cooperative and moves all extremities without obvious deficit.  SKIN:  Warm and dry.   EKG from the office shows sinus rhythm with PACs and PVCs.   LABS FROM THE OFFICE:  Show a white count 4.9, hemoglobin 13.8,  hematocrit 41.3, platelets 248.  INR is 1.  Sodium 140, potassium 3.7,  BUN 13, creatinine 1.16.   IMPRESSION:  1. Severe claudication.  2. Known peripheral vascular disease with right common iliac artery      stenting and right external iliac artery angioplasty March 2009      with residual total left SFA and 70-80% right SFA.  3. Coronary artery bypass grafting x4 November 2003, Myoview October      2009 showing no ischemia.  4. Preserved left ventricular function with evidence of diastolic      dysfunction by echocardiogram October 2009.  5. Treated hypertension.  6. Treated  dyslipidemia.  7. History of non-insulin-dependent diabetes.  8. History of glaucoma.  9. History of smoking.   PLAN:  The patient is admitted to telemetry for PV angiogram.      Abelino Derrick, P.A.      [name] [name], MD  Electronically Signed    LKK/MEDQ  D:  05/07/2008  T:  05/07/2008  Job:  [id number]

## 2010-09-12 NOTE — Op Note (Signed)
NAME:  Cristian Hayden, Cristian Hayden                          ACCOUNT NO.:  [id number]   MEDICAL RECORD NO.:  [id number]                   PATIENT TYPE:  OIB   LOCATION:  2855                                 FACILITY:  [facility]   PHYSICIAN:  [name] [name], M.D.                  DATE OF BIRTH:  [birth year]   DATE OF PROCEDURE:  01/01/2004  DATE OF DISCHARGE:  12/14/2003                                 OPERATIVE REPORT   PERIPHERAL ANGIOGRAM:   PROCEDURE PERFORMED:  1.  Abdominal aortogram.  2.  Abdominal aortogram with bilateral femoral runoff.   INDICATION:  [name] is a 70 year old gentleman with history of  coronary artery disease status post coronary artery bypass graft on March 21, 2002.  History of hypertension, hyperlipidemia, previous smoking who has  severe lifestyle-limiting claudication and markedly abnormal lower extremity  Dopplers.  Given these symptoms as per patient's request because of his  increasing symptoms of claudication he was brought to the catheterization  lab to evaluate his peripheral anatomy.   DATA:   ABDOMINAL AORTOGRAM:  Abdominal aortogram revealed presence of two renal  arteries; one on either side.  They are widely patent.  There is mild  atherosclerotic changes noted in the abdominal aorta.  The aortoiliac  bifurcation was widely patent.   LEFT ILIAC ARTERY WITH FEMORAL RUNOFF:  Left common iliac artery at the  bifurcation of its internal and external iliac has an 80-90% stenosis.  This  stenosis extends throughout the left external iliac and common femoral  artery.  This constituted anywhere from 80-90% long segmental stenosis.  The  left profunda has an ostial 90% stenosis and the left SFA is completely  occluded with recannulization just above the popliteals.  There is a very  short segment of the SFA followed by occlusion of the popliteal.  The distal  popliteal is patent and is filled by collaterals.  The left posterior tibial  is occluded and  peroneal and anterior tibial are patent.   RIGHT ILIAC ARTERY WITH FEMORAL RUNOFF:  Right common iliac artery is widely  patent.  The right external iliac artery shows 70-80% long diffuse stenosis.  This involves the right common femoral artery.  The right SFA has a 99%  proximal stenosis followed by a long segmental occlusion.  The right  profunda femoris has a 70-80% proximal stenosis.  This gives collaterals to  the distal SFA and the SFA reconstitutes just at the end of the Hunter's  canal.  There is three-vessel runoff noted in the right leg.  However, the  right anterior tibial has a proximal 80% tandem stenosis.   IMPRESSION:  1.  Long segment 80-90% stenosis of the left common iliac extending into the      left external iliac and common femoral artery.  2.  Occlusion of the left superficial femoral artery which is very long and  reconstitutes just above the popliteal artery.  The popliteal artery is      reoccluded again and this is collateralized distally by collaterals from      the profunda and also from the distal superficial femoral artery.  3.  There is two-vessel runoff noted in the left leg with patent peroneal      and anterior tibial.  4.  Long segment 70-80% stenosis of the right external iliac artery.  99%      stenosis of the proximal right superficial femoral artery followed by a      long segment occlusion of the right superficial femoral artery.  The      superficial femoral artery is collateralized by the right profunda which      has a proximal 70-80% stenosis.  There is three-vessel runoff noted in      the right leg, however, the right anterior tibial shows an 80% tandem      stenosis.   RECOMMENDATIONS:  Based on the peripheral angiogram and the anatomy, the  patient should be considered for surgical revascularization given his  severity of symptoms.  This will be discussed with the patient and options  will be again discussed with the patient.  I did  request Dr. Liliane Bade to  look at the angiograms.  Further recommendations will follow.   TECHNIQUE OF PROCEDURE:  Under usual sterile precautions, using a 5 French  right femoral arterial access, a 5 French pigtail catheter was advanced into  the abdominal aorta and abdominal aortogram was performed.  Then, the  catheter was pulled back into the lower part of the abdominal aorta.  Abdominal aortogram with bilateral iliac and femoral runoff was performed.  Then, the catheter was pulled out of the body in the usual fashion.  The  patient tolerated the procedure well.  No immediate complications noted.                                               [name] [name], M.D.    Pilar Plate  D:  01/01/2004  T:  01/01/2004  Job:  [id number]   cc:   Renaye Rakers, M.D.  [address]  [place]  [place] [address]  Fax: [phone]

## 2010-09-12 NOTE — Discharge Summary (Signed)
NAME:  Cristian Hayden, Cristian Hayden                          ACCOUNT NO.:  [id number]   MEDICAL RECORD NO.:  [id number]                   PATIENT TYPE:  INP   LOCATION:  2039                                 FACILITY:  [facility]   PHYSICIAN:  Lissa Merlin, P.A.                    DATE OF BIRTH:  29-Dec-1940   DATE OF ADMISSION:  03/20/2002  DATE OF DISCHARGE:  03/26/2002                                 DISCHARGE SUMMARY   FINAL DIAGNOSES:  1. Severe three vessel coronary artery disease with good systolic function,     ejection fraction of 57%.  2. Unstable angina with recent abnormal stress test.  3. Peripheral vascular disease with 0.7 ABI on the right and 0.6 ABI on the     left.  4. Hypertension.  5. Hyperlipidemia.  6. Gastroesophageal reflux disease.  7. Mild postoperative volume overload.   PROCEDURES:  1. Cardiac catheterization on 03/20/02.  2. CABG times four on 03/21/02 with the following grafts; LIMA to LAD,     saphenous vein graft to OM1 to OM2 sequentially, saphenous vein graft to     PDA.   BRIEF HISTORY:  This patient is a 70 year old, black male who presented with  symptoms of progressive exertional and some resting angina.  Cardiac  catheterization was done after an abnormal stress test, this showed severe  three vessel coronary artery disease.  Dr. Donata Clay was consulted and CABG  was recommended.  It was agreed to proceed and he underwent the procedure on  03/21/02, there were no complications.  Postoperatively, he was stable and  he transferred to 2000 on postoperative day two.  There were no major  complications.  He began working with cardiac rehab and was diuresed for  volume excess and edema.  Postoperative day four, he was doing very well,  walking around the unit.  He was in sinus rhythm, afebrile.  The next day,  03/26/02, he continued to do very well.  He was afebrile, in sinus rhythm,  96 on room air.  Physical exam was satisfactory.  The wounds were healing  well.   He was discharged home in stable condition.   MEDICATIONS:  1. Lopressor 25 mg b.i.d.  2. Lipitor 2 mg daily.  3. Lasix 40 mg daily for five days.  4. Kay Ciel 20 mEq daily for five days.  5. Altace 2.5 mg daily.  6. Prevacid 30 mg daily.  7. Ultram 50 mg, one to two every four to six hours p.r.n. for pain.   SPECIAL INSTRUCTIONS:  No driving, heavy lifting or strenuous activities.  No working.  He was told to use his incentive spirometer daily and to walk  daily.  He was told to shower and to clean his wounds gently daily with soap  and water.  He was told to get a chest x-ray at [facility]  one hour  before he saw Dr. Donata Clay.   CONDITION:  Stable.   DISPOSITION:  Home.   FOLLOWUP:  1. Dr. [name] two weeks after discharge.  2. Dr. Donata Clay three weeks after discharge.                                               Lissa Merlin, P.A.    JL/MEDQ  D:  05/22/2002  T:  05/22/2002  Job:  [id number]   cc:   [name] [name], M.D.  [address]  [place]  [place] [address]  Fax: [phone]   Renaye Rakers, M.D.  [phone] [address]  [place]  [place] [address]  Fax: [phone]

## 2010-09-12 NOTE — Consult Note (Signed)
NAME:  Cristian Hayden, Cristian Hayden NO.:  [id number]   MEDICAL RECORD NO.:  [id number]                   PATIENT TYPE:  INP   LOCATION:  2029                                 FACILITY:  [facility]   PHYSICIAN:  Kerin Perna III, M.D.           DATE OF BIRTH:  May 12, 1940   DATE OF CONSULTATION:  03/20/2002  DATE OF DISCHARGE:                                   CONSULTATION   REQUESTING PHYSICIAN:  Vonna Kotyk R. [name], M.D.   PRIMARY CARE PHYSICIAN:  Renaye Rakers, M.D.   REASON FOR CONSULTATION:  Left main and three vessel coronary artery  disease.   CHIEF COMPLAINT:  Chest pain.   HISTORY OF PRESENT ILLNESS:  I was asked to evaluate this 69 year old  African-American male for potential surgical coronary revascularization for  recently diagnosed left main and three vessel coronary artery disease.  The  patient has had recent episodes of exertional chest pain.  It has been  evaluated by Dr. [name].  A stress test was performed which was positive for  ischemia in the lateral region of the left ventricle.  He underwent cardiac  catheterization today which demonstrated a 90% left main stenosis with  occlusion of the distal right coronary artery, as well as 70% stenosis of  the proximal left anterior descending, obtuse marginal #1, and obtuse  marginal #2.  His ejection fraction was 55 to 60%, and was felt to be a  candidate for surgical coronary revascularization.  Prior to  catheterization, a chest wall 2-D echocardiogram showed left ventricular  hypertrophy, no mitral regurgitation, no aortic stenosis.  The patient is  currently stable on heparin.  He has been previously treated with Plavix,  Imdur, Toprol for his coronary artery disease.   PAST MEDICAL HISTORY:  1. Hypertension.  2. Hyperlipidemia.  3. Gastroesophageal reflux disease.   ALLERGIES:  No known drug allergies.   MEDICATIONS:  1. Lipitor 10 mg p.o. q.d.  2. Nitroglycerin 0.4 mg p.o. p.r.n.  3. Prevacid  30 mg p.o. q.d.  4. Avalide 300/12.5 mg p.o. q.d.  5. Plavix 75 mg q.d.  6. Imdur 60 mg q.d.  7. Toprol XL 25 mg p.o. q.d.   SOCIAL HISTORY:  The patient works as a Museum/gallery exhibitions officer.  He stopped smoking  one week ago, and smoked 1/2 to 1 pack per day for several years.  He denies  significant alcohol history.  He is married and lives with his wife who  works outside of the home.  She is in good health.   FAMILY HISTORY:  No known history of coronary artery disease, myocardial  infarction, or cardiac valve disease.   REVIEW OF SYMPTOMS:  GENERAL:  Negative for recent weight loss, fevers, or  night sweats.  ENT:  Negative for recent change in vision, headache, or  difficulty swallowing.  He has no recent active dental problems.  PULMONARY:  Negative for bronchitis, productive  cough, fever, hemoptysis, or abnormal  chest x-ray.  CARDIAC:  Positive for his angina which is class 3 progressive  angina.  No symptoms of congestive heart failure, previous cardiac murmur,  or rheumatic heart disease.  GASTROINTESTINAL:  Positive for a previous  upper endoscopy which was negative.  He has gastroesophageal reflux disease,  and this is symptomatically controlled with Prevacid.  VASCULAR:  Positive  for bilateral lower leg claudication.  He has bilateral superficial femoral  artery occlusion with ABI's of 0.5 to 0.6 bilaterally.  He has no known  history of venous stasis disease or deep vein thrombosis of his lower  extremities.  ENDOCRINE:  Negative for diabetes or thyroid disease.  HEMATOLOGIC:  Negative for prior blood transfusion or bleeding disorder.  MUSCULOSKELETAL:  Negative for fractures or chest trauma.  He has had no  previous surgery.  NEUROLOGIC:  Negative for seizures, syncope, or stroke.   PHYSICAL EXAMINATION:  VITAL SIGNS:  He is 5 feet 6 inches and weighs 160  pounds, blood pressure 140/85, pulse 60 and regular, respirations 18.  GENERAL:  A pleasant male in his hospital room  following cardiac  catheterization accompanied by his wife and daughter.  HEENT:  Normocephalic.  Full extraocular movements.  Pharynx clear.  Dentition under adequate repair.  NECK:  Supple without JVD, thyromegaly, mass, or carotid bruit.  LYMPHATICS:  No palpable adenopathy of the cervical, supraclavicular, or  axillary regions.  LUNGS:  Clear to auscultation.  There is no thoracic deformity.  CARDIAC:  Regular rhythm without murmurs, rubs, or gallops.  ABDOMEN:  Soft, nontender, without mass, organomegaly, or abdominal bruit.  His right groin has a compression dressing at the cardiac catheterization  site.  RECTAL:  Deferred.  EXTREMITIES:  No cyanosis, clubbing, edema, or tenderness.  VASCULAR:  Pulses are 2+ in the radial and femoral areas.  There are no  palpable popliteal or pedal pulses in the lower extremities.  SKIN:  Without rash, lesion, or ulceration.  NEUROLOGIC:  Alert and oriented x3 with full motor function, no focal motor  deficit.   LABORATORY DATA:  A review of coronary arteriogram showed to Dr. [name], and  agree with his interpretation of significant left main three vessel coronary  artery disease with preserved left ventricular function.  His pre-cath  laboratory work showed a hematocrit of 41%, platelet count of 270,000, white  blood cell count of 5000, BUN 9, creatinine 0.6, glucose of 104.  TSH 0.99.   PLAN:  I would recommend surgical coronary revascularization based on this  patient's symptoms and his coronary anatomy as defined by cardiac  catheterization.  I would recommend left internal mammary artery to the left  anterior descending and vein grafts to the circumflex marginal branches,  posterior descending artery, and possibly the diagonal.   I have reviewed the indications and expected benefits of coronary artery  bypass grafting for treatment of his coronary artery disease with the patient and his wife.  We reviewed the indications, benefits, and  associated  risks, including risks of myocardial infarction, cerebrovascular accident,  bleeding, blood transfusion requirement,  infection, and death.  He understands that there are alternatives to  surgical therapy for treatment of his coronary disease.  The patient agrees  to proceed with the operation as planned which will be scheduled for  03/21/02.  Thank you very much for this consultation.  Mikey Bussing, M.D.    PV/MEDQ  D:  03/20/2002  T:  03/20/2002  Job:  [id number]   cc:   [name] [name], M.D.  [address]  [place]  [place] [address]  Fax: [phone]   Renaye Rakers, M.D.  [phone] [address]  [place]  [place] [address]  Fax: [phone]

## 2010-09-12 NOTE — Op Note (Signed)
NAME:  Cristian Hayden, Cristian Hayden                          ACCOUNT NO.:  [id number]   MEDICAL RECORD NO.:  [id number]                   PATIENT TYPE:  INP   LOCATION:  2312                                 FACILITY:  [facility]   PHYSICIAN:  Kerin Perna III, M.D.           DATE OF BIRTH:  Jun 13, 1940   DATE OF PROCEDURE:  03/21/2002  DATE OF DISCHARGE:                                 OPERATIVE REPORT   OPERATION:  Coronary artery bypass grafting x 4 (left internal mammary  artery to LAD, saphenous vein graft to posterior descending, sequential  saphenous vein graft to OM1 and OM2).   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   SURGEON:  Kerin Perna, M.D.   ASSISTANT:  Coral Ceo, P.A.-C.   ANESTHESIA:  General by Dr. Kipp Brood.   INDICATIONS:  The patient is a 70 year old black male who presented with  symptoms of progressive exertional and some resting angina.  He underwent  cardiac catheterization by Dr. [name], which demonstrated a 95% stenosis of  the left main and total occlusion of the right coronary, with preserved LV  function.  He was placed on heparin and nitroglycerin, and a surgical  consultation was obtained.  Following catheterization I examined the patient  in his hospital room and reviewed the results of the cardiac catheterization  with the patient and his family.  I discussed the indications and expected  benefits of coronary artery bypass surgery for treatment of his coronary  artery disease, including left main stenosis.  I reviewed with the patient  the alternatives to surgical therapy for treatment of his coronary artery  disease.  I discussed with the patient and family the major aspects of the  proposed operation including the choice of conduit for grafting, the  location of the surgical incisions, the use of general anesthesia and  cardiopulmonary  bypass, and the expected postoperative hospital recovery.  I  discussed with the patient the risks to him of coronary artery bypass  surgery, including the risks of MI, CVA, bleeding, blood transfusion,  infection, and death.  He understood these implications for the surgery and  agreed to proceed with the operation as planned, under what I felt was an  informed consent.  We specifically discussed the likelihood that the patient  would have some coagulation abnormalities following surgery due to his  dosing of Plavix on the date of his cardiac catheterization.  The risks of  bleeding were superceded by waiting on surgical revascularization due to his  high grade left main stenosis.   OPERATIVE FINDINGS:  The patient's saphenous vein was of average quality and  was harvested from the right thigh using endoscopic vein technique.  The  coronaries had heavy calcification.  The circumflex vessels were  intramyocardial.  The right coronary was totally occluded and  was a small  vessel, but graftable.  The patient had significant coagulopathy and  received 2 units of platelets and 2 units of fresh frozen plasma in the  operating room to improve his coagulation abnormality.   PROCEDURE:  The patient was brought to the operating room and placed supine  on the operating table.  General anesthesia was induced and invasive  hemodynamic monitoring.  The chest, abdomen and legs were prepped with  Betadine and draped as a sterile field.  A sternal incision was made.  The  saphenous vein was harvested from the right thigh using endoscopic vein  technique.  The left internal mammary artery was harvested as a pedicle  graft from his its origin at the subclavian vessels.  It was a good vessel  with excellent flow.  Heparin was administered and the ACT was documented as  being therapeutic.  The sternal retractor was placed.  Two pursestrings  placed in the ascending aorta and right atrium.  The patient was  cannulated  and placed on bypass and cooled to 32 degrees.  The ascending aorta has some  plaque and atherosclerotic disease and the cannulation site was placed in  the area of minimal disease.  The coronaries were then identified after  going on bypass and the saphenous vein and mammary artery were prepared for  the distal anastomoses.  A cardioplegic cannula was placed in the ascending  aorta.  The patient was cooled to 30 degrees.  As the aortic cross-clamp was  applied, 500 cc. of cold blood cardioplegia was delivered to the aortic root  with good cardioplegic arrest and several attempts to drop temperature to  less than 12 degrees.  Topical ice and slush was used to augment myocardial  preservation and a pericardial insulator pad was used to protect the left  phrenic nerve.   The distal coronary anastomoses were then performed.  The first distal  anastomosis was to the posterior descending.  This was totally occluded.  A  reverse saphenous vein was sewn end-to-side with running 7-0 Prolene to this  1.5 mm. vessel and there was good flow through the graft.  The second and  third distal anastomoses consisted of a sequential vein graft to the OM1 and  continued to OM2.  These were intramyocardial and were heavily diseased  proximally with 95% stenosis.  A side-to-side anastomosis was constructed  between the saphenous vein and OM1 with running 7-0 Prolene.  The saphenous  vein was then continued to the OM2, which was a 1.5 mm. vessel with proximal  95% stenosis.  The end of the vein was sewn end-to-side with running 7-0  Prolene.  There was good flow through the graft.  Cardioplegia was redosed.  The fourth distal anastomosis was at the distal third of the LAD, distal to  the area of significant calcified plaque.  The left internal mammary artery  was brought through an opening created in the left lateral pericardium, and was brought down onto the LAD and sewn end-to-side with running  8-0 Prolene.  There was excellent flow through the anastomosis with immediate rise in  supple temperature after release of the pedicle clamp on the mammary artery.  The mammary pedicle was secured to the epicardium and the aortic cross-clamp  was removed.   The heart resumed a spontaneous rhythm.  Using a partial occluding clamp,  two proximal vein anastomoses were placed on the ascending aorta using a 4.0  mm. punch and running 6-0 Prolene.  The  partial clamp was carefully released  and the vein grafts were perfused.  Each had good flow and hemostasis was  documented at the proximal and distal anastomoses.  The patient was rewarmed  and reperfused.  Temporary pacing wires were applied.  The lungs were re-  expanded.  The ventilator was resumed.  The patient was then weaned from  bypass without inotropes with stable blood pressure and cardiac output.  Protamine was administered.  There was no adverse reaction to the Protamine.  The cannulas were removed.  The mediastinum was irrigated with warm  antibiotic irrigation.  The leg incision was irrigated and closed in a  standard fashion.  The pericardium was loosely reapproximated.  Two  mediastinal and a left pleural chest tube were placed and brought out  through separate incisions.  The sternum was closed with interrupted steel  wire.  The pectoralis fascia was closed with interrupted Vicryl.  The  subcutaneous tissue and skin were closed with a running Vicryl.  Sterile  dressings were applied.  Total bypass time was 130 minutes with aortic cross-  clamp time of 60 minutes.                                               Mikey Bussing, M.D.    PV/MEDQ  D:  03/21/2002  T:  03/21/2002  Job:  [id number]   cc:   [name] [name], M.D.  [address]  [place]  [place] [address]  Fax: [phone]

## 2011-01-19 LAB — CBC
HCT: 37.1 — ABNORMAL LOW
HCT: 37.4 — ABNORMAL LOW
Hemoglobin: 12.7 — ABNORMAL LOW
MCHC: 34.1
MCV: 90.4
Platelets: 240
RDW: 14.1
RDW: 14.3

## 2011-01-19 LAB — BASIC METABOLIC PANEL
BUN: 6
CO2: 25
CO2: 27
Calcium: 9
Chloride: 100
Chloride: 102
GFR calc Af Amer: >60
Glucose, Bld: 106 — ABNORMAL HIGH
Potassium: 3.8
Sodium: 135

## 2011-08-11 ENCOUNTER — Encounter: Payer: Self-pay | Admitting: Neurosurgery

## 2011-08-25 ENCOUNTER — Encounter: Payer: Self-pay | Admitting: Neurosurgery

## 2011-08-26 ENCOUNTER — Ambulatory Visit (INDEPENDENT_AMBULATORY_CARE_PROVIDER_SITE_OTHER): Payer: Medicare Other | Admitting: Neurosurgery

## 2011-08-26 ENCOUNTER — Ambulatory Visit (INDEPENDENT_AMBULATORY_CARE_PROVIDER_SITE_OTHER): Payer: Medicare Other | Admitting: *Deleted

## 2011-08-26 ENCOUNTER — Encounter: Payer: Self-pay | Admitting: Neurosurgery

## 2011-08-26 VITALS — BP 135/74 | HR 56 | Resp 16 | Ht 64.0 in | Wt 172.9 lb

## 2011-08-26 DIAGNOSIS — I70219 Atherosclerosis of native arteries of extremities with intermittent claudication, unspecified extremity: Secondary | ICD-10-CM

## 2011-08-26 DIAGNOSIS — Z48812 Encounter for surgical aftercare following surgery on the circulatory system: Secondary | ICD-10-CM

## 2011-08-26 DIAGNOSIS — I7092 Chronic total occlusion of artery of the extremities: Secondary | ICD-10-CM

## 2011-08-26 NOTE — Progress Notes (Signed)
[facility] [place] HISTORY AND PHYSICAL   CC: Six-month lower arterial exam, ABI Referring Physician: Edilia Bo  History of Present Illness: 71 year old male patient of Dr. Adele Dan followed for history of claudication also underwent a right to left femoral to femoral bypass in February 2010 with Dr. Madilyn Fireman. Patient reports no constant claudication symptoms. He states he does have some mild claudication with long walks. He does have ED since he underwent the procedure in 2010 which he is going to address with his primary care.  Past Medical History  Diagnosis Date  . Arthritis   . Hypertension   . Diabetes mellitus   . Asthma   . Bronchitis   . Peripheral vascular disease     with claludication  . Hyperlipidemia     and Dyslipidemia    ROS: [x]  Positive   [ ]  Denies    General: [ ]  Weight loss, [ ]  Fever, [ ]  chills Neurologic: [ ]  Dizziness, [ ]  Blackouts, [ ]  Seizure [ ]  Stroke, [ ]  "Mini stroke", [ ]  Slurred speech, [ ]  Temporary blindness; [ ]  weakness in arms or legs, [ ]  Hoarseness Cardiac: [ ]  Chest pain/pressure, [ ]  Shortness of breath at rest [ ]  Shortness of breath with exertion, [ ]  Atrial fibrillation or irregular heartbeat Vascular: [ ]  Pain in legs with walking, [ ]  Pain in legs at rest, [ ]  Pain in legs at night,  [ ]  Non-healing ulcer, [ ]  Blood clot in vein/DVT,   Pulmonary: [ ]  Home oxygen, [ ]  Productive cough, [ ]  Coughing up blood, [ ]  Asthma,  [ ]  Wheezing Musculoskeletal:  [ ]  Arthritis, [ ]  Low back pain, [ ]  Joint pain Hematologic: [ ]  Easy Bruising, [ ]  Anemia; [ ]  Hepatitis Gastrointestinal: [ ]  Blood in stool, [ ]  Gastroesophageal Reflux/heartburn, [ ]  Trouble swallowing Urinary: [ ]  chronic Kidney disease, [ ]  on HD - [ ]  MWF or [ ]  TTHS, [ ]  Burning with urination, [ ]  Difficulty urinating Skin: [ ]  Rashes, [ ]  Wounds Psychological: [ ]  Anxiety, [ ]  Depression   Social History History  Substance Use Topics  . Smoking  status: Current Everyday Smoker -- 1.0 packs/day    Types: Cigarettes  . Smokeless tobacco: Not on file  . Alcohol Use: 0.6 oz/week    1 Glasses of wine per week    Family History Family History  Problem Relation Age of Onset  . Heart disease Mother     Heart Disease before age 31  . Hypertension Mother   . Heart disease Father     Heart Disease before age 74  . Hypertension Father   . Hypertension Sister   . Hypertension Brother     Not on File  Current Outpatient Prescriptions  Medication Sig Dispense Refill  . amLODipine (NORVASC) 10 MG tablet Take 10 mg by mouth Daily.      Marland Kitchen aspirin 325 MG tablet Take 325 mg by mouth daily.      . carvedilol (COREG) 25 MG tablet Take 12.5 mg by mouth 2 (two) times daily with a meal.       . dorzolamide-timolol (COSOPT) 22.3-6.8 MG/ML ophthalmic solution Place 1 drop into both eyes 2 (two) times daily.       Marland Kitchen latanoprost (XALATAN) 0.005 % ophthalmic solution Place 0.005 mLs into both eyes Daily.      Marland Kitchen lisinopril (PRINIVIL,ZESTRIL) 10 MG tablet Take 20 mg by mouth daily.       Marland Kitchen  pravastatin (PRAVACHOL) 40 MG tablet Take 40 mg by mouth 2 (two) times daily.      Marland Kitchen terazosin (HYTRIN) 5 MG capsule Take 5 mg by mouth at bedtime.      Marland Kitchen atorvastatin (LIPITOR) 80 MG tablet Take 80 mg by mouth daily.      . clopidogrel (PLAVIX) 75 MG tablet Take 75 mg by mouth daily.      Marland Kitchen esomeprazole (NEXIUM) 40 MG capsule Take 40 mg by mouth daily before breakfast.      . olmesartan-hydrochlorothiazide (BENICAR HCT) 40-12.5 MG per tablet Take 1 tablet by mouth daily.        Physical Examination  Filed Vitals:   08/26/11 1535  BP: 135/74  Pulse: 56  Resp: 16    Body mass index is 29.68 kg/(m^2).  General:  WDWN in NAD Gait: Normal HEENT: WNL Eyes: Pupils equal Pulmonary: normal non-labored breathing , without Rales, rhonchi,  wheezing Cardiac: RRR, without  Murmurs, rubs or gallops; No carotid bruits Abdomen: soft, NT, no masses Skin: no  rashes, ulcers noted Vascular Exam/Pulses: Patient has palpable femoral pulses with a patent graft, no lower extremity pulses are palpated  Extremities without ischemic changes, no Gangrene , no cellulitis; no open wounds;  Musculoskeletal: no muscle wasting or atrophy  Neurologic: A&O X 3; Appropriate Affect ; SENSATION: normal; MOTOR FUNCTION:  moving all extremities equally. Speech is fluent/normal  Non-Invasive Vascular Imaging: ABIs today are 0.65 on the right, 0.52 on the left which is virtually unchanged from previous. The patient did not have a graft duplex today  ASSESSMENT/PLAN: I discussed the findings with Dr. Edilia Bo, he agrees the patient should have repeat ABIs in 6 months with a duplex of his graft. The patient states understanding, his questions were encouraged and answered.  Lauree Chandler ANP next  Clinic M.D.: Edilia Bo

## 2011-08-27 ENCOUNTER — Other Ambulatory Visit: Payer: Self-pay | Admitting: *Deleted

## 2011-08-27 DIAGNOSIS — Z48812 Encounter for surgical aftercare following surgery on the circulatory system: Secondary | ICD-10-CM

## 2011-08-27 DIAGNOSIS — I7092 Chronic total occlusion of artery of the extremities: Secondary | ICD-10-CM

## 2011-09-26 HISTORY — PX: CATARACT EXTRACTION: SUR2

## 2012-03-03 ENCOUNTER — Ambulatory Visit: Payer: Medicare Other | Admitting: Neurosurgery

## 2012-03-07 ENCOUNTER — Encounter: Payer: Self-pay | Admitting: Neurosurgery

## 2012-03-08 ENCOUNTER — Encounter (INDEPENDENT_AMBULATORY_CARE_PROVIDER_SITE_OTHER): Payer: Medicare Other | Admitting: *Deleted

## 2012-03-08 ENCOUNTER — Encounter: Payer: Self-pay | Admitting: Neurosurgery

## 2012-03-08 ENCOUNTER — Ambulatory Visit (INDEPENDENT_AMBULATORY_CARE_PROVIDER_SITE_OTHER): Payer: Medicare Other | Admitting: Neurosurgery

## 2012-03-08 VITALS — BP 136/72 | HR 48 | Resp 16 | Ht 66.0 in | Wt 166.2 lb

## 2012-03-08 DIAGNOSIS — I7092 Chronic total occlusion of artery of the extremities: Secondary | ICD-10-CM

## 2012-03-08 DIAGNOSIS — I739 Peripheral vascular disease, unspecified: Secondary | ICD-10-CM

## 2012-03-08 DIAGNOSIS — R209 Unspecified disturbances of skin sensation: Secondary | ICD-10-CM

## 2012-03-08 DIAGNOSIS — Z48812 Encounter for surgical aftercare following surgery on the circulatory system: Secondary | ICD-10-CM

## 2012-03-08 DIAGNOSIS — IMO0001 Reserved for inherently not codable concepts without codable children: Secondary | ICD-10-CM | POA: Insufficient documentation

## 2012-03-08 DIAGNOSIS — J00 Acute nasopharyngitis [common cold]: Secondary | ICD-10-CM

## 2012-03-08 DIAGNOSIS — R200 Anesthesia of skin: Secondary | ICD-10-CM | POA: Insufficient documentation

## 2012-03-08 NOTE — Progress Notes (Signed)
[facility] [place] PAD/PVD Office Note  CC: PVD surveillance Referring Physician: Edilia Bo  History of Present Illness: 71 year old male patient of Dr. Edilia Bo followed for history of claudication who had a right to left femoral to femoral bypass in February 2010 with Dr. Madilyn Fireman the patient comes back today for three-month check and reports more "cold and numbness" in the lower extremities left greater than right. The patient states his claudication may have increased some however he's always had trouble with pain and walking. The patient denies any new medical diagnoses or recent surgery.  Past Medical History  Diagnosis Date  . Arthritis   . Hypertension   . Bronchitis   . Peripheral vascular disease     with claludication  . Hyperlipidemia     and Dyslipidemia    ROS: [x]  Positive   [ ]  Denies    General: [ ]  Weight loss, [ ]  Fever, [ ]  chills Neurologic: [ ]  Dizziness, [ ]  Blackouts, [ ]  Seizure [ ]  Stroke, [ ]  "Mini stroke", [ ]  Slurred speech, [ ]  Temporary blindness; [ ]  weakness in arms or legs, [ ]  Hoarseness Cardiac: [ ]  Chest pain/pressure, [ ]  Shortness of breath at rest [ ]  Shortness of breath with exertion, [ ]  Atrial fibrillation or irregular heartbeat Vascular: [ ]  Pain in legs with walking, [ ]  Pain in legs at rest, [ ]  Pain in legs at night,  [ ]  Non-healing ulcer, [ ]  Blood clot in vein/DVT,   Pulmonary: [ ]  Home oxygen, [ ]  Productive cough, [ ]  Coughing up blood, [ ]  Asthma,  [ ]  Wheezing Musculoskeletal:  [ ]  Arthritis, [ ]  Low back pain, [ ]  Joint pain Hematologic: [ ]  Easy Bruising, [ ]  Anemia; [ ]  Hepatitis Gastrointestinal: [ ]  Blood in stool, [ ]  Gastroesophageal Reflux/heartburn, [ ]  Trouble swallowing Urinary: [ ]  chronic Kidney disease, [ ]  on HD - [ ]  MWF or [ ]  TTHS, [ ]  Burning with urination, [ ]  Difficulty urinating Skin: [ ]  Rashes, [ ]  Wounds Psychological: [ ]  Anxiety, [ ]  Depression   Social History History  Substance  Use Topics  . Smoking status: Current Every Day Smoker -- 1.0 packs/day    Types: Cigarettes  . Smokeless tobacco: Never Used  . Alcohol Use: 0.6 oz/week    1 Glasses of wine per week    Family History Family History  Problem Relation Age of Onset  . Heart disease Mother     Heart Disease before age 42  . Hypertension Mother   . Heart disease Father     Heart Disease before age 49  . Hypertension Father   . Hypertension Sister   . Hypertension Brother     Not on File  Current Outpatient Prescriptions  Medication Sig Dispense Refill  . amLODipine (NORVASC) 10 MG tablet Take 10 mg by mouth Daily.      Marland Kitchen aspirin 325 MG tablet Take 325 mg by mouth daily.      Marland Kitchen atorvastatin (LIPITOR) 80 MG tablet Take 80 mg by mouth daily.      . carvedilol (COREG) 25 MG tablet Take 12.5 mg by mouth 2 (two) times daily with a meal.       . clopidogrel (PLAVIX) 75 MG tablet Take 75 mg by mouth daily.      . dorzolamide-timolol (COSOPT) 22.3-6.8 MG/ML ophthalmic solution Place 1 drop into both eyes 2 (two) times daily.       Marland Kitchen esomeprazole (NEXIUM)  40 MG capsule Take 40 mg by mouth daily before breakfast.      . latanoprost (XALATAN) 0.005 % ophthalmic solution Place 0.005 mLs into both eyes Daily.      Marland Kitchen lisinopril (PRINIVIL,ZESTRIL) 10 MG tablet Take 20 mg by mouth daily.       Marland Kitchen olmesartan-hydrochlorothiazide (BENICAR HCT) 40-12.5 MG per tablet Take 1 tablet by mouth daily.      . pravastatin (PRAVACHOL) 40 MG tablet Take 40 mg by mouth 2 (two) times daily.      Marland Kitchen terazosin (HYTRIN) 5 MG capsule Take 5 mg by mouth at bedtime.        Physical Examination  Filed Vitals:   03/08/12 1205  BP: 136/72  Pulse: 48  Resp: 16    Body mass index is 26.83 kg/(m^2).  General:  WDWN in NAD Gait: Normal HEENT: WNL Eyes: Pupils equal Pulmonary: normal non-labored breathing , without Rales, rhonchi,  wheezing Cardiac: RRR, without  Murmurs, rubs or gallops; No carotid bruits Abdomen: soft, NT, no  masses Skin: no rashes, ulcers noted Vascular Exam/Pulses: Lower extremity pulses are not palpable  Extremities without ischemic changes, no Gangrene , no cellulitis; no open wounds;  Musculoskeletal: no muscle wasting or atrophy  Neurologic: A&O X 3; Appropriate Affect ; SENSATION: normal; MOTOR FUNCTION:  moving all extremities equally. Speech is fluent/normal  Non-Invasive Vascular Imaging: ABIs today are 0.55 on the right, 0.48 on the left which is a slight decreased from 8 2013.65 on the right, 0.52 on the left. Vascular evaluation today does show an occluded right left fem-fem bypass, this was reviewed with Dr. Arbie Cookey, he did speak with the patient.  ASSESSMENT/PLAN: Occluded fem-fem bypass with worsening claudication. Dr. Arbie Cookey in the patient the option of undergoing another arteriogram versus returning in 3 months to discuss his options with Dr. Edilia Bo. The patient has chosen to return in 3 months to speak with Dr. Edilia Bo, they'll be no new studies at that time per Dr. Arbie Cookey. The patient's questions were encouraged and answered, he is in agreement and requested this plan.  Lauree Chandler ANP  Clinic M.D.: Early

## 2012-05-09 ENCOUNTER — Telehealth (HOSPITAL_COMMUNITY): Payer: Self-pay | Admitting: Family Medicine

## 2012-05-09 ENCOUNTER — Emergency Department (HOSPITAL_COMMUNITY)
Admission: EM | Admit: 2012-05-09 | Discharge: 2012-05-09 | Disposition: A | Discharge status: Home / Self Care | Payer: Medicare Other | Source: Home / Self Care

## 2012-05-09 ENCOUNTER — Encounter (HOSPITAL_COMMUNITY): Payer: Self-pay

## 2012-05-09 DIAGNOSIS — Z951 Presence of aortocoronary bypass graft: Secondary | ICD-10-CM

## 2012-05-09 DIAGNOSIS — I2510 Atherosclerotic heart disease of native coronary artery without angina pectoris: Secondary | ICD-10-CM

## 2012-05-09 DIAGNOSIS — F17200 Nicotine dependence, unspecified, uncomplicated: Secondary | ICD-10-CM

## 2012-05-09 DIAGNOSIS — I10 Essential (primary) hypertension: Secondary | ICD-10-CM

## 2012-05-09 DIAGNOSIS — E785 Hyperlipidemia, unspecified: Secondary | ICD-10-CM

## 2012-05-09 LAB — COMPREHENSIVE METABOLIC PANEL
AST: 16 U/L (ref 0–37)
Albumin: 4.1 g/dL (ref 3.5–5.2)
Calcium: 9.4 mg/dL (ref 8.4–10.5)
Creatinine, Ser: 0.75 mg/dL (ref 0.50–1.35)

## 2012-05-09 LAB — LIPID PANEL
HDL: 37 mg/dL — ABNORMAL LOW (ref >39)
LDL Cholesterol: 144 mg/dL — ABNORMAL HIGH (ref 0–99)
Triglycerides: 186 mg/dL — ABNORMAL HIGH (ref <150)
VLDL: 37 mg/dL (ref 0–40)

## 2012-05-09 MED ORDER — CARVEDILOL 12.5 MG PO TABS: 12.5000 mg | ORAL_TABLET | Freq: Two times a day (BID) | ORAL | Status: DC

## 2012-05-09 MED ORDER — TERAZOSIN HCL 5 MG PO CAPS: 5.0000 mg | ORAL_CAPSULE | Freq: Every day | ORAL | Status: DC

## 2012-05-09 MED ORDER — AMLODIPINE BESYLATE 10 MG PO TABS: 10.0000 mg | ORAL_TABLET | Freq: Every day | ORAL | Status: DC

## 2012-05-09 MED ORDER — LISINOPRIL 20 MG PO TABS: 20.0000 mg | ORAL_TABLET | Freq: Every day | ORAL | Status: DC

## 2012-05-09 MED ORDER — PRAVASTATIN SODIUM 80 MG PO TABS: 80.0000 mg | ORAL_TABLET | Freq: Every day | ORAL | Status: DC

## 2012-05-09 NOTE — ED Notes (Signed)
Chart review.

## 2012-05-09 NOTE — ED Notes (Signed)
Medication refill

## 2012-05-09 NOTE — ED Provider Notes (Signed)
History     CSN: [id number]  Arrival date & time 05/09/12  1020   None     Chief Complaint  Patient presents with  . Medication Refill    (Consider location/radiation/quality/duration/timing/severity/associated sxs/prior treatment) HPI Pt reports he is out of his medications.  He is a smoker but has cut down to 2 cigs per day.  No complaints today.  No CP, No SOB.  Pt says he had CABG in 2002 but has not followed up with a cardiologist in over 5 years.  Pt denies any symptoms of headache or visual changes today.    Past Medical History  Diagnosis Date  . Arthritis   . Hypertension   . Bronchitis   . Peripheral vascular disease     with claludication  . Hyperlipidemia     and Dyslipidemia    Past Surgical History  Procedure Date  . Femoral-femoral bpga 05/29/1999    Left profundoplasty  and Left Fem/Pop 05/28/2008  . Coronary artery bypass graft   . Eye surgery June 2013    Cataract Right eye    Family History  Problem Relation Age of Onset  . Heart disease Mother     Heart Disease before age 51  . Hypertension Mother   . Heart disease Father     Heart Disease before age 41  . Hypertension Father   . Hypertension Sister   . Hypertension Brother     History  Substance Use Topics  . Smoking status: Current Every Day Smoker -- 1.0 packs/day    Types: Cigarettes  . Smokeless tobacco: Never Used  . Alcohol Use: 0.6 oz/week    1 Glasses of wine per week      Review of Systems  Constitutional: Negative.   HENT: Negative.   Eyes: Negative.   Respiratory: Negative.   Cardiovascular: Negative.   Gastrointestinal: Negative.   Musculoskeletal: Negative.   Neurological: Negative.   Hematological: Negative.   Psychiatric/Behavioral: Negative.   All other systems reviewed and are negative.    Allergies  Review of patient's allergies indicates no known allergies.  Home Medications   Current Outpatient Rx  Name  Route  Sig  Dispense  Refill  . AMLODIPINE  BESYLATE 10 MG PO TABS   Oral   Take 10 mg by mouth Daily.         . ASPIRIN 325 MG PO TABS   Oral   Take 325 mg by mouth daily.         . ATORVASTATIN CALCIUM 80 MG PO TABS   Oral   Take 80 mg by mouth daily.         Marland Kitchen CARVEDILOL 25 MG PO TABS   Oral   Take 12.5 mg by mouth 2 (two) times daily with a meal.          . CLOPIDOGREL BISULFATE 75 MG PO TABS   Oral   Take 75 mg by mouth daily.         . DORZOLAMIDE HCL-TIMOLOL MAL 22.3-6.8 MG/ML OP SOLN   Both Eyes   Place 1 drop into both eyes 2 (two) times daily.          Marland Kitchen ESOMEPRAZOLE MAGNESIUM 40 MG PO CPDR   Oral   Take 40 mg by mouth daily before breakfast.         . LATANOPROST 0.005 % OP SOLN   Both Eyes   Place 0.005 mLs into both eyes Daily.         Marland Kitchen  LISINOPRIL 10 MG PO TABS   Oral   Take 20 mg by mouth daily.          Marland Kitchen OLMESARTAN MEDOXOMIL-HCTZ 40-12.5 MG PO TABS   Oral   Take 1 tablet by mouth daily.         Marland Kitchen PRAVASTATIN SODIUM 40 MG PO TABS   Oral   Take 40 mg by mouth 2 (two) times daily.         Marland Kitchen TERAZOSIN HCL 5 MG PO CAPS   Oral   Take 5 mg by mouth at bedtime.           BP 184/73  Pulse 56  Temp 98.4 F (36.9 C) (Oral)  Resp 19  SpO2 99%  Physical Exam  Nursing note and vitals reviewed. Constitutional: He is oriented to person, place, and time. He appears well-developed and well-nourished. No distress.  HENT:  Head: Normocephalic and atraumatic.  Eyes: EOM are normal. Pupils are equal, round, and reactive to light.  Neck: Normal range of motion. Neck supple.  Cardiovascular: Normal rate, regular rhythm and normal heart sounds.   Pulmonary/Chest: Effort normal and breath sounds normal.  Abdominal: Soft. Bowel sounds are normal.  Musculoskeletal: Normal range of motion. He exhibits no edema and no tenderness.  Neurological: He is alert and oriented to person, place, and time. He has normal reflexes.  Skin: Skin is warm and dry.  Psychiatric: He has a normal  mood and affect. His behavior is normal. Judgment and thought content normal.    ED Course  Procedures (including critical care time)  Labs Reviewed - No data to display No results found.   No diagnosis found.   MDM  IMPRESSION  Hypertension  CAD s/p CABG (2002)  Hyperlipidemia  Chronic active nicotine dependence  RECOMMENDATIONS / PLAN Pt reports he had his flu vaccine this season Refilled meds today and ordered labs today: NOTE: pt is confused about his medications, he is not quite sure what he is supposed to be taking, I looked at his last office visit with Dr. Andrey Campanile and refilled meds on her note.  Will refer pt to cardiologist as well.  He has not seen one in over 5 years (has seen Dr. [name] in the past) Follow results of labs Strongly advised pt to stop all nicotine products and offered medical support I reviewed the medical records that patient brought with him today and asked him to please allow [place] to scan the medical records into the EMR.  FOLLOW UP 2 months   The patient was given clear instructions to go to ER or return to medical center if symptoms don't improve, worsen or new problems develop.  The patient verbalized understanding.  The patient was told to call to get lab results if they haven't heard anything in the next week.            Cleora Fleet, MD 05/09/12 1310

## 2012-05-11 ENCOUNTER — Telehealth (HOSPITAL_COMMUNITY): Payer: Self-pay

## 2012-05-11 NOTE — Telephone Encounter (Signed)
Message copied by Lestine Mount on Wed May 11, 2012 12:49 PM ------      Message from: Cleora Fleet      Created: Tue May 10, 2012  6:47 PM       Please notify the patient that his cholesterol came back elevated.  They sure he is taking his cholesterol medication regularly.  With him having a history of heart disease he really needs to make sure he takes that statin medication daily.  I would like to recheck his cholesterol in 3 months.  Please follow up with cardiologists as we have referred him for an appointment.  His other labs came back okay.            Rodney Langton, MD, CDE, FAAFP      [facility]      [facility]      [place], [place]

## 2012-05-12 NOTE — ED Notes (Signed)
Referred to [facility]  appt 05/25/2012

## 2012-05-25 ENCOUNTER — Encounter: Payer: Self-pay | Admitting: Cardiology

## 2012-05-25 ENCOUNTER — Ambulatory Visit (INDEPENDENT_AMBULATORY_CARE_PROVIDER_SITE_OTHER): Payer: Medicare Other | Admitting: Cardiology

## 2012-05-25 VITALS — BP 125/60 | HR 51 | Ht 67.0 in | Wt 167.0 lb

## 2012-05-25 DIAGNOSIS — I25810 Atherosclerosis of coronary artery bypass graft(s) without angina pectoris: Secondary | ICD-10-CM

## 2012-05-25 DIAGNOSIS — R9431 Abnormal electrocardiogram [ECG] [EKG]: Secondary | ICD-10-CM | POA: Insufficient documentation

## 2012-05-25 NOTE — Progress Notes (Signed)
HPI The patient presents as a new patient. He has a history of coronary artery disease with CABG in 2003.  He also has peripheral vascular disease status post lower extremity bypass.   he has not been seen by cardiology in some time. He did present to an urgent care a couple of weeks ago because he did run out of his medicines. His primary care doctor is no longer practicing. He doesn't have another primary care doctor. He does occasionally get some chest discomfort. This is under his left breast. There is maybe some epigastric discomfort. He doesn't recall any discomfort or to his bypass. He doesn't describe any ongoing shortness of breath, PND or orthopnea. He has no palpitations, presyncope or syncope. He has no weight gain or edema. He does get claudication with walking.   No Known Allergies  Current Outpatient Prescriptions  Medication Sig Dispense Refill  . amLODipine (NORVASC) 10 MG tablet Take 1 tablet (10 mg total) by mouth daily.  30 tablet  3  . aspirin 325 MG tablet Take 325 mg by mouth daily.      . carvedilol (COREG) 12.5 MG tablet Take 1 tablet (12.5 mg total) by mouth 2 (two) times daily with a meal.  60 tablet  3  . dorzolamide-timolol (COSOPT) 22.3-6.8 MG/ML ophthalmic solution Place 1 drop into both eyes 2 (two) times daily.       Marland Kitchen esomeprazole (NEXIUM) 40 MG capsule Take 40 mg by mouth daily before breakfast.      . latanoprost (XALATAN) 0.005 % ophthalmic solution Place 0.005 mLs into both eyes Daily.      Marland Kitchen lisinopril (PRINIVIL,ZESTRIL) 20 MG tablet Take 1 tablet (20 mg total) by mouth daily.  30 tablet  3  . olmesartan-hydrochlorothiazide (BENICAR HCT) 40-12.5 MG per tablet Take 1 tablet by mouth daily.      . pravastatin (PRAVACHOL) 80 MG tablet Take 1 tablet (80 mg total) by mouth daily.  30 tablet  3  . terazosin (HYTRIN) 5 MG capsule Take 1 capsule (5 mg total) by mouth at bedtime.  30 capsule  3  . [DISCONTINUED] atorvastatin (LIPITOR) 80 MG tablet Take 80 mg by  mouth daily.        Past Medical History  Diagnosis Date  . Arthritis   . Hypertension   . Bronchitis   . Peripheral vascular disease     with claludication  . Hyperlipidemia     and Dyslipidemia    Past Surgical History  Procedure Date  . Femoral-femoral bpga 05/29/1999    Left profundoplasty  and Left Fem/Pop 05/28/2008  . Coronary artery bypass graft   . Eye surgery June 2013    Cataract Right eye    Family History  Problem Relation Age of Onset  . Heart disease Mother     Heart Disease before age 41  . Hypertension Mother   . Heart disease Father     Heart Disease before age 57  . Hypertension Father   . Hypertension Sister   . Hypertension Brother     History   Social History  . Marital Status: Married    Spouse Name: N/A    Number of Children: N/A  . Years of Education: N/A   Occupational History  . Not on file.   Social History Main Topics  . Smoking status: Current Some Day Smoker -- 1.0 packs/day    Types: Cigarettes  . Smokeless tobacco: Never Used     Comment: smoke 1-3 every  other day  . Alcohol Use: 0.6 oz/week    1 Glasses of wine per week  . Drug Use: No  . Sexually Active: Not on file   Other Topics Concern  . Not on file   Social History Narrative  . No narrative on file    ROS:  Positive for difficulty swallowing, ankle pain and swelling. Otherwise as stated in the HPI and negative for all other systems.  PHYSICAL EXAM BP 125/60  Pulse 51  Ht 5\' 7"  (1.702 m)  Wt 167 lb (75.751 kg)  BMI 26.16 kg/m2 GENERAL:  Well appearing HEENT:  Pupils equal round and reactive, fundi not visualized, oral mucosa unremarkable NECK:  No jugular venous distention, waveform within normal limits, carotid upstroke brisk and symmetric, no bruits, no thyromegaly LYMPHATICS:  No cervical, inguinal adenopathy LUNGS:  Clear to auscultation bilaterally BACK:  No CVA tenderness CHEST:  Well healed sternotomy scar. HEART:  PMI not displaced or sustained,S1  and S2 within normal limits, no S3, no S4, no clicks, no rubs, no murmurs ABD:  Flat, positive bowel sounds normal in frequency in pitch, no bruits, no rebound, no guarding, no midline pulsatile mass, no hepatomegaly, no splenomegaly EXT:  2 plus pulses upper, diminished DP/PT bilaterally, no edema, no cyanosis no clubbing SKIN:  No rashes no nodules NEURO:  Cranial nerves II through XII grossly intact, motor grossly intact throughout PSYCH:  Cognitively intact, oriented to person place and time  EKG:  Sinus bradycardia, rate 51, axis within normal limits, intervals within normal limits, no acute ST-T wave changes.  05/25/2012   ASSESSMENT AND PLAN  CAD - He does have 72 year old bypass grafts, he's not particularly depressed and is not participating in risk reduction.  Given this he needs stress testing. He would not be a little walk on a treadmill so we will have a Lexiscan Myoview.  Tobacco abuse - We discussed a specific strategy for tobacco cessation.  (Greater than three minutes discussing tobacco cessation.)  HTN - The blood pressure is at target. No change in medications is indicated. We will continue with therapeutic lifestyle changes (TLC).  HYPERLIPIDEMIA - He will remain on the meds as listed.

## 2012-05-25 NOTE — Patient Instructions (Addendum)
The current medical regimen is effective;  continue present plan and medications.  Your physician has requested that you have a lexiscan myoview. For further information please visit [email or web address]. Please follow instruction sheet, as given.  Follow up in 1 year with Dr Hochrein.  You will receive a letter in the mail 2 months before you are due.  Please call us when you receive this letter to schedule your follow up appointment.  

## 2012-06-07 ENCOUNTER — Encounter: Payer: Self-pay | Admitting: Vascular Surgery

## 2012-06-08 ENCOUNTER — Encounter: Payer: Self-pay | Admitting: Vascular Surgery

## 2012-06-08 ENCOUNTER — Ambulatory Visit (INDEPENDENT_AMBULATORY_CARE_PROVIDER_SITE_OTHER): Payer: Medicare Other | Admitting: Vascular Surgery

## 2012-06-08 VITALS — BP 135/66 | HR 48 | Ht 67.0 in | Wt 168.0 lb

## 2012-06-08 DIAGNOSIS — I70219 Atherosclerosis of native arteries of extremities with intermittent claudication, unspecified extremity: Secondary | ICD-10-CM | POA: Insufficient documentation

## 2012-06-08 NOTE — Assessment & Plan Note (Signed)
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 He has a chronically occluded fem-fem bypass graft that had been done by Dr. Madilyn Fireman in 2010. This was a right to left fem-fem bypass with profundoplasty on the left. As his symptoms are stable we'll continue with conservative treatment. We have again had a discussion about the importance of tobacco cessation. We have discussed the importance of a structured walking program. I plan on seeing him back in one year and I have ordered follow up ABIs for that time. Certainly if his symptoms progress before then he knows to call. If his symptoms progress or he developed significant rest pain or nonhealing ulcer and we could consider arteriography and revascularization. I suspect that he would require aortobifemoral bypass grafting.

## 2012-06-08 NOTE — Progress Notes (Signed)
[facility]  Patient name: Cristian Hayden MRN: [id number] DOB: 08/31/1940 Sex: male  REASON FOR VISIT: follow up of bilateral lower from a claudication.  HPI: Cristian Hayden is a 72 y.o. male who had undergone a right to left fem-fem bypass graft and left profundoplasty by Dr. Madilyn Fireman in 2010. He was seen for a routine follow up visit by our nurse practitioner 3 months ago at that time was noted to have an occluded fem-fem bypass graft. He was set up for 3 month follow up visit. He states that he has stable claudication in both lower extremities. He experiences pain in his calves which is brought on by ambulation and relieved with rest. Symptoms are equal on both sides. The symptoms occur after approximately one block. He denies any significant thigh are hip claudication. He denies any history of rest pain or nonhealing ulcers. He occasionally gets some paresthesias in the left foot.  He has a history of hypertension and hyperlipidemia which had been stable on his current medications.  Past Medical History  Diagnosis Date  . Arthritis   . Hypertension   . Bronchitis   . Peripheral vascular disease     with claludication  . Hyperlipidemia     and Dyslipidemia  . CAD (coronary artery disease)   . PVD (peripheral vascular disease)     Family History  Problem Relation Age of Onset  . Hypertension Mother   . Heart disease Father 68    CAD  . Hypertension Father   . Hypertension Sister   . Hypertension Brother     SOCIAL HISTORY: History  Substance Use Topics  . Smoking status: Current Some Day Smoker -- 0.20 packs/day    Types: Cigarettes  . Smokeless tobacco: Never Used     Comment: pt states he is trying, only smokes about 5 per day  . Alcohol Use: 0.6 oz/week    1 Glasses of wine per week     He states that he is smoking approximately 5 cigarettes a day.   No Known Allergies  Current Outpatient Prescriptions  Medication Sig Dispense Refill  .  amLODipine (NORVASC) 10 MG tablet Take 1 tablet (10 mg total) by mouth daily.  30 tablet  3  . aspirin 325 MG tablet Take 325 mg by mouth daily.      . carvedilol (COREG) 12.5 MG tablet Take 1 tablet (12.5 mg total) by mouth 2 (two) times daily with a meal.  60 tablet  3  . dorzolamide-timolol (COSOPT) 22.3-6.8 MG/ML ophthalmic solution Place 1 drop into both eyes 2 (two) times daily.       Marland Kitchen esomeprazole (NEXIUM) 40 MG capsule Take 40 mg by mouth daily before breakfast.      . latanoprost (XALATAN) 0.005 % ophthalmic solution Place 0.005 mLs into both eyes Daily.      Marland Kitchen lisinopril (PRINIVIL,ZESTRIL) 20 MG tablet Take 1 tablet (20 mg total) by mouth daily.  30 tablet  3  . olmesartan-hydrochlorothiazide (BENICAR HCT) 40-12.5 MG per tablet Take 1 tablet by mouth daily.      . pravastatin (PRAVACHOL) 80 MG tablet Take 1 tablet (80 mg total) by mouth daily.  30 tablet  3  . terazosin (HYTRIN) 5 MG capsule Take 1 capsule (5 mg total) by mouth at bedtime.  30 capsule  3  . [DISCONTINUED] atorvastatin (LIPITOR) 80 MG tablet Take 80 mg by mouth daily.       No current facility-administered medications for  this visit.    REVIEW OF SYSTEMS: Arly.Keller ] denotes positive finding; [  ] denotes negative finding  CARDIOVASCULAR:  [ ]  chest pain   [ ]  chest pressure   [ ]  palpitations   [ ]  orthopnea   [ ]  dyspnea on exertion   Arly.Keller ] claudication   [ ]  rest pain   [ ]  DVT   [ ]  phlebitis PULMONARY:   [ ]  productive cough   [ ]  asthma   [ ]  wheezing NEUROLOGIC:   [ ]  weakness  Arly.Keller ] paresthesias- occasional in left foot.  [ ]  aphasia  [ ]  amaurosis  [ ]  dizziness HEMATOLOGIC:   [ ]  bleeding problems   [ ]  clotting disorders MUSCULOSKELETAL:  [ ]  joint pain   [ ]  joint swelling [ ]  leg swelling GASTROINTESTINAL: [ ]   blood in stool  [ ]   hematemesis GENITOURINARY:  [ ]   dysuria  [ ]   hematuria PSYCHIATRIC:  [ ]  history of major depression INTEGUMENTARY:  [ ]  rashes  [ ]  ulcers CONSTITUTIONAL:  [ ]  fever   [ ]   chills  PHYSICAL EXAM: Filed Vitals:   06/08/12 1027  BP: 135/66  Pulse: 48  Height: 5\' 7"  (1.702 m)  Weight: 168 lb (76.204 kg)  SpO2: 99%   Body mass index is 26.31 kg/(m^2). GENERAL: The patient is a well-nourished male, in no acute distress. The vital signs are documented above. CARDIOVASCULAR: There is a regular rate and rhythm. I do not detect carotid bruits. I cannot palpate femoral pulses. I cannot palpate popliteal or pedal pulses. He has no significant lower extremity swelling. PULMONARY: There is good air exchange bilaterally without wheezing or rales. ABDOMEN: Soft and non-tender with normal pitched bowel sounds.  MUSCULOSKELETAL: There are no major deformities or cyanosis. NEUROLOGIC: No focal weakness or paresthesias are detected. SKIN: There are no ulcers or rashes noted. PSYCHIATRIC: The patient has a normal affect.  DATA:  I have reviewed his arterial Doppler study from 03/08/2012. He is a right to left fem-fem bypass graft is occluded. He has monophasic Doppler signals in both feet with an ABI of 55% on the right and 48% on the left. These ABIs are down slightly compared to May of 2013 when he was 65% on the right and 52% on the left.  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 He has a chronically occluded fem-fem bypass graft that had been done by Dr. Madilyn Fireman in 2010. This was a right to left fem-fem bypass with profundoplasty on the left. As his symptoms are stable we'll continue with conservative treatment. We have again had a discussion about the importance of tobacco cessation. We have discussed the importance of a structured walking program. I plan on seeing him back in one year and I have ordered follow up ABIs for that time. Certainly if his  symptoms progress before then he knows to call. If his symptoms progress or he developed significant rest pain or nonhealing ulcer and we could consider arteriography and revascularization. I suspect that he would require aortobifemoral bypass grafting.   DICKSON,CHRISTOPHER S [facility] [place] Beeper: [phone]

## 2012-06-13 ENCOUNTER — Ambulatory Visit (HOSPITAL_COMMUNITY): Payer: Medicare Other | Attending: Cardiology | Admitting: Radiology

## 2012-06-13 VITALS — BP 150/83 | Ht 64.0 in | Wt 167.0 lb

## 2012-06-13 DIAGNOSIS — Z951 Presence of aortocoronary bypass graft: Secondary | ICD-10-CM | POA: Insufficient documentation

## 2012-06-13 DIAGNOSIS — R079 Chest pain, unspecified: Secondary | ICD-10-CM

## 2012-06-13 DIAGNOSIS — I739 Peripheral vascular disease, unspecified: Secondary | ICD-10-CM | POA: Insufficient documentation

## 2012-06-13 DIAGNOSIS — F17200 Nicotine dependence, unspecified, uncomplicated: Secondary | ICD-10-CM | POA: Insufficient documentation

## 2012-06-13 DIAGNOSIS — R0609 Other forms of dyspnea: Secondary | ICD-10-CM | POA: Insufficient documentation

## 2012-06-13 DIAGNOSIS — R0989 Other specified symptoms and signs involving the circulatory and respiratory systems: Secondary | ICD-10-CM | POA: Insufficient documentation

## 2012-06-13 DIAGNOSIS — Z8249 Family history of ischemic heart disease and other diseases of the circulatory system: Secondary | ICD-10-CM | POA: Insufficient documentation

## 2012-06-13 DIAGNOSIS — I10 Essential (primary) hypertension: Secondary | ICD-10-CM | POA: Insufficient documentation

## 2012-06-13 DIAGNOSIS — R0789 Other chest pain: Secondary | ICD-10-CM | POA: Insufficient documentation

## 2012-06-13 DIAGNOSIS — I25810 Atherosclerosis of coronary artery bypass graft(s) without angina pectoris: Secondary | ICD-10-CM

## 2012-06-13 DIAGNOSIS — E785 Hyperlipidemia, unspecified: Secondary | ICD-10-CM | POA: Insufficient documentation

## 2012-06-13 DIAGNOSIS — I2510 Atherosclerotic heart disease of native coronary artery without angina pectoris: Secondary | ICD-10-CM | POA: Insufficient documentation

## 2012-06-13 DIAGNOSIS — R9431 Abnormal electrocardiogram [ECG] [EKG]: Secondary | ICD-10-CM

## 2012-06-13 DIAGNOSIS — E119 Type 2 diabetes mellitus without complications: Secondary | ICD-10-CM | POA: Insufficient documentation

## 2012-06-13 MED ORDER — TECHNETIUM TC 99M SESTAMIBI GENERIC - CARDIOLITE
11.0000 | Freq: Once | INTRAVENOUS | Status: AC | PRN
  Administered 2012-06-13: 11 via INTRAVENOUS

## 2012-06-13 MED ORDER — REGADENOSON 0.4 MG/5ML IV SOLN
0.4000 mg | Freq: Once | INTRAVENOUS | Status: AC
  Administered 2012-06-13: 0.4 mg via INTRAVENOUS

## 2012-06-13 MED ORDER — TECHNETIUM TC 99M SESTAMIBI GENERIC - CARDIOLITE
33.0000 | Freq: Once | INTRAVENOUS | Status: AC | PRN
  Administered 2012-06-13: 33 via INTRAVENOUS

## 2012-06-13 NOTE — Progress Notes (Signed)
[facility] SITE 3 NUCLEAR MED [address] [place], [place] [address] [phone]    Cardiology Nuclear Med Study  Cristian Hayden is a 72 y.o. male     MRN : [id number]     DOB: 23-Jun-1940  Procedure Date: 06/13/2012  Nuclear Med Background Indication for Stress Test:  Evaluation for Ischemia and Graft Patency History:  2003 Heart Cath: Cabg x4 2009 MPS: (-) ischemia low risk done with Ganji NL EF, ECHO: EF: 50-55% Cardiac Risk Factors: Claudication, Family History - CAD, Hypertension, Lipids, PVD, Smoker and History of NIDDM  Symptoms:  Chest Pain and DOE   Nuclear Pre-Procedure Caffeine/Decaff Intake:  None NPO After: 7:00pm and cup of milk at 0600  Lungs:  clear O2 Sat: 98% on room air. IV 0.9% NS with Angio Cath:  22g  IV Site: R Hand  IV Started by:  Cathlyn Parsons, RN  Chest Size (in):  42 Cup Size: n/a  Height: 5\' 4"  (1.626 m)  Weight:  167 lb (75.751 kg)  BMI:  Body mass index is 28.65 kg/(m^2). Tech Comments:  Coreg taken at 0600    Nuclear Med Study 1 or 2 day study: 1 day  Stress Test Type:  Treadmill/Lexiscan  Reading MD: Dietrich Pates, MD  Order Authorizing Provider:  Melany Guernsey  Resting Radionuclide: Technetium 63m Sestamibi  Resting Radionuclide Dose: 10.8 mCi   Stress Radionuclide:  Technetium 9m Sestamibi  Stress Radionuclide Dose: 33.0 mCi           Stress Protocol Rest HR: 51 Stress HR: 89  Rest BP: 150/83 Stress BP: 143/66  Exercise Time (min): n/a METS: n/a   Predicted Max HR: 149 bpm % Max HR: 59.73 bpm Rate Pressure Product: 21308   Dose of Adenosine (mg):  n/a Dose of Lexiscan: 0.4 mg  Dose of Atropine (mg): n/a Dose of Dobutamine: n/a mcg/kg/min (at max HR)  Stress Test Technologist: Milana Na, EMT-P  Nuclear Technologist:  Domenic Polite, CNMT     Rest Procedure:  Myocardial perfusion imaging was performed at rest 45 minutes following the intravenous administration of Technetium 76m Sestamibi. Rest ECG:  NSR - Normal EKG  Stress Procedure:  The patient received IV Lexiscan 0.4 mg over 15-seconds with concurrent low level exercise and then Technetium 25m Sestamibi was injected at 30-seconds while the patient continued walking one more minute.  This patient had sob, lt. Headedness, and abdominal pain with Lexiscan. Quantitative spect images were obtained after a 45-minute delay. Stress ECG: No significant change from baseline ECG  QPS Raw Data Images: Images were motion corrected.  Soft tissue (diaphragm, bowel activity) underlie heart. Stress Images: Small inferoapical defect.  Otherwise normal perfusion.   Rest Images:  Improvement in the small inferoapical defect. Subtraction (SDS):  No significant ischemia. Transient Ischemic Dilatation (Normal <1.22):  1.05 Lung/Heart Ratio (Normal <0.45):  0.33  Quantitative Gated Spect Images QGS EDV:  101 ml QGS ESV:  33 ml  Impression Exercise Capacity:  Lexiscan with low level exercise. BP Response:  Normal blood pressure response. Clinical Symptoms:  No chest pain. ECG Impression:  No significant ST segment change suggestive of ischemia. Comparison with Prior Nuclear Study: \By report, previous scan showed no ischemia.  Overall Impression: Very small inferoapical defect that improves.  Does not appear to represent significant ischemia.  May represent some shifting soft tissue .  Otherwise normal perfusion.  Overall low risk scan.  LV Ejection Fraction: 67%.  LV Wall Motion:  NL LV Function;  [place]  Dorris Carnes

## 2012-06-14 ENCOUNTER — Other Ambulatory Visit: Payer: Self-pay | Admitting: *Deleted

## 2012-06-14 DIAGNOSIS — Z48812 Encounter for surgical aftercare following surgery on the circulatory system: Secondary | ICD-10-CM

## 2012-06-14 DIAGNOSIS — I739 Peripheral vascular disease, unspecified: Secondary | ICD-10-CM

## 2012-07-14 ENCOUNTER — Encounter: Payer: Self-pay | Admitting: *Deleted

## 2012-09-09 ENCOUNTER — Telehealth: Payer: Self-pay | Admitting: Family Medicine

## 2012-09-09 NOTE — Telephone Encounter (Signed)
Pt asking for amlodipineoral 10mg 

## 2012-09-12 ENCOUNTER — Ambulatory Visit: Payer: Medicare Other | Attending: Internal Medicine | Admitting: Internal Medicine

## 2012-09-12 VITALS — BP 115/72 | HR 69 | Temp 98.4°F | Resp 16 | Wt 169.0 lb

## 2012-09-12 DIAGNOSIS — I25810 Atherosclerosis of coronary artery bypass graft(s) without angina pectoris: Secondary | ICD-10-CM

## 2012-09-12 DIAGNOSIS — E785 Hyperlipidemia, unspecified: Secondary | ICD-10-CM | POA: Insufficient documentation

## 2012-09-12 DIAGNOSIS — I70219 Atherosclerosis of native arteries of extremities with intermittent claudication, unspecified extremity: Secondary | ICD-10-CM

## 2012-09-12 DIAGNOSIS — I739 Peripheral vascular disease, unspecified: Secondary | ICD-10-CM | POA: Insufficient documentation

## 2012-09-12 DIAGNOSIS — I2510 Atherosclerotic heart disease of native coronary artery without angina pectoris: Secondary | ICD-10-CM | POA: Insufficient documentation

## 2012-09-12 DIAGNOSIS — I10 Essential (primary) hypertension: Secondary | ICD-10-CM | POA: Insufficient documentation

## 2012-09-12 MED ORDER — LISINOPRIL 20 MG PO TABS: 20.0000 mg | ORAL_TABLET | Freq: Every day | ORAL | Status: DC

## 2012-09-12 MED ORDER — CARVEDILOL 12.5 MG PO TABS: 12.5000 mg | ORAL_TABLET | Freq: Two times a day (BID) | ORAL | Status: DC

## 2012-09-12 MED ORDER — AMLODIPINE BESYLATE 10 MG PO TABS: 10.0000 mg | ORAL_TABLET | Freq: Every day | ORAL | Status: DC

## 2012-09-12 MED ORDER — TERAZOSIN HCL 5 MG PO CAPS: 5.0000 mg | ORAL_CAPSULE | Freq: Every day | ORAL | Status: DC

## 2012-09-12 MED ORDER — PRAVASTATIN SODIUM 80 MG PO TABS: 80.0000 mg | ORAL_TABLET | Freq: Every day | ORAL | Status: DC

## 2012-09-12 NOTE — Progress Notes (Signed)
Patient has a history of HTN Need medication refill

## 2012-09-12 NOTE — Progress Notes (Signed)
Patient ID: Cristian Hayden, male   DOB: Jun 06, 1940, 72 y.o.   MRN: [id number] Patient Demographics  Cristian Hayden, is a 72 y.o. male  WUJ:[id number]  NFA:[id number]  DOB - March 27, 1941  Chief Complaint  Patient presents with  . Hypertension        Subjective:   Cristian Hayden today is here for a follow up visit-he needs refill on his medications, He has no complaints during this visit.  . Patient has No headache, No chest pain, No abdominal pain - No Nausea, No new weakness tingling or numbness, No Cough - SOB.   Objective:    Filed Vitals:   09/12/12 1249  BP: 115/72  Pulse: 69  Temp: 98.4 F (36.9 C)  Resp: 16  Weight: 169 lb (76.658 kg)  SpO2: 99%     ALLERGIES:  No Known Allergies  PAST MEDICAL HISTORY: Past Medical History  Diagnosis Date  . Arthritis   . Hypertension   . Bronchitis   . Peripheral vascular disease     with claludication  . Hyperlipidemia     and Dyslipidemia  . CAD (coronary artery disease)   . PVD (peripheral vascular disease)     MEDICATIONS AT HOME: Prior to Admission medications   Medication Sig Start Date End Date Taking? Authorizing Provider  amLODipine (NORVASC) 10 MG tablet Take 1 tablet (10 mg total) by mouth daily. 09/12/12  Yes Shanker Levora Dredge, MD  aspirin 325 MG tablet Take 325 mg by mouth daily.   Yes Historical Provider, MD  carvedilol (COREG) 12.5 MG tablet Take 1 tablet (12.5 mg total) by mouth 2 (two) times daily with a meal. 09/12/12  Yes Shanker Levora Dredge, MD  dorzolamide-timolol (COSOPT) 22.3-6.8 MG/ML ophthalmic solution Place 1 drop into both eyes 2 (two) times daily.  07/22/11  Yes Historical Provider, MD  latanoprost (XALATAN) 0.005 % ophthalmic solution Place 0.005 mLs into both eyes Daily. 08/06/11  Yes Historical Provider, MD  lisinopril (PRINIVIL,ZESTRIL) 20 MG tablet Take 1 tablet (20 mg total) by mouth daily. 09/12/12  Yes Shanker Levora Dredge, MD  pravastatin (PRAVACHOL) 80 MG tablet Take 1 tablet (80 mg total) by  mouth daily. 09/12/12  Yes Shanker Levora Dredge, MD  terazosin (HYTRIN) 5 MG capsule Take 1 capsule (5 mg total) by mouth at bedtime. 09/12/12  Yes Shanker Levora Dredge, MD  esomeprazole (NEXIUM) 40 MG capsule Take 40 mg by mouth daily before breakfast.    Historical Provider, MD     Exam  General appearance :Awake, alert, not in any distress. Speech Clear. Not toxic Looking HEENT: Atraumatic and Normocephalic, pupils equally reactive to light and accomodation Neck: supple, no JVD. No cervical lymphadenopathy.  Chest:Good air entry bilaterally, no added sounds  CVS: S1 S2 regular, no murmurs.  Abdomen: Bowel sounds present, Non tender and not distended with no gaurding, rigidity or rebound. Extremities: B/L Lower Ext shows no edema, both legs are warm to touch Neurology: Awake alert, and oriented X 3, CN II-XII intact, Non focal Skin:No Rash Wounds:N/A    Data Review   CBC No results found for this basename: WBC, HGB, HCT, PLT, MCV, MCH, MCHC, RDW, NEUTRABS, LYMPHSABS, MONOABS, EOSABS, BASOSABS, BANDABS, BANDSABD,  in the last 168 hours  Chemistries   No results found for this basename: NA, K, CL, CO2, GLUCOSE, BUN, CREATININE, GFRCGP, CALCIUM, MG, AST, ALT, ALKPHOS, BILITOT,  in the last 168 hours ------------------------------------------------------------------------------------------------------------------ No results found for this basename: HGBA1C,  in the last 72 hours ------------------------------------------------------------------------------------------------------------------ No results  found for this basename: CHOL, HDL, LDLCALC, TRIG, CHOLHDL, LDLDIRECT,  in the last 72 hours ------------------------------------------------------------------------------------------------------------------ No results found for this basename: TSH, T4TOTAL, FREET3, T3FREE, THYROIDAB,  in the last 72  hours ------------------------------------------------------------------------------------------------------------------ No results found for this basename: VITAMINB12, FOLATE, FERRITIN, TIBC, IRON, RETICCTPCT,  in the last 72 hours  Coagulation profile  No results found for this basename: INR, PROTIME,  in the last 168 hours    Assessment & Plan   HTN -controlled -continue with Amlodipine,Lisinopril and Coreg  Dyslipidemia -c/w Pravachol -will need to check Lipid panel at next visit  CAD -has app with Dr Antoine Poche in July -recent Nuclear Stress test negative -c/w ASA, Statin and Coreg  Peripheral Vascular disease  -follow up with Dr Almon Hercules [facility] last note-Dr Edilia Bo will do outpatient ABI's for surveillance and will follow him -on ASA, Statins  Follow up in 2 months

## 2012-09-20 ENCOUNTER — Encounter: Payer: Self-pay | Admitting: Cardiology

## 2012-11-09 ENCOUNTER — Ambulatory Visit (HOSPITAL_COMMUNITY)
Admission: RE | Admit: 2012-11-09 | Discharge: 2012-11-09 | Disposition: A | Discharge status: Home / Self Care | Payer: Medicare Other | Source: Ambulatory Visit | Attending: Internal Medicine | Admitting: Internal Medicine

## 2012-11-09 ENCOUNTER — Other Ambulatory Visit (HOSPITAL_COMMUNITY): Payer: Self-pay | Admitting: Internal Medicine

## 2012-11-09 DIAGNOSIS — R52 Pain, unspecified: Secondary | ICD-10-CM

## 2012-11-09 DIAGNOSIS — M47812 Spondylosis without myelopathy or radiculopathy, cervical region: Secondary | ICD-10-CM | POA: Insufficient documentation

## 2012-11-18 ENCOUNTER — Ambulatory Visit: Payer: Medicare Other | Admitting: Cardiology

## 2012-12-15 ENCOUNTER — Ambulatory Visit (INDEPENDENT_AMBULATORY_CARE_PROVIDER_SITE_OTHER): Payer: Medicare Other | Admitting: Cardiology

## 2012-12-15 ENCOUNTER — Encounter: Payer: Self-pay | Admitting: Cardiology

## 2012-12-15 VITALS — BP 110/62 | HR 45 | Ht 66.0 in | Wt 168.0 lb

## 2012-12-15 DIAGNOSIS — I70219 Atherosclerosis of native arteries of extremities with intermittent claudication, unspecified extremity: Secondary | ICD-10-CM

## 2012-12-15 DIAGNOSIS — I2510 Atherosclerotic heart disease of native coronary artery without angina pectoris: Secondary | ICD-10-CM | POA: Insufficient documentation

## 2012-12-15 DIAGNOSIS — I7092 Chronic total occlusion of artery of the extremities: Secondary | ICD-10-CM

## 2012-12-15 DIAGNOSIS — R9431 Abnormal electrocardiogram [ECG] [EKG]: Secondary | ICD-10-CM

## 2012-12-15 DIAGNOSIS — I25810 Atherosclerosis of coronary artery bypass graft(s) without angina pectoris: Secondary | ICD-10-CM

## 2012-12-15 DIAGNOSIS — E7800 Pure hypercholesterolemia, unspecified: Secondary | ICD-10-CM

## 2012-12-15 MED ORDER — ROSUVASTATIN CALCIUM 20 MG PO TABS: 20.0000 mg | ORAL_TABLET | Freq: Every day | ORAL | Status: DC

## 2012-12-15 NOTE — Patient Instructions (Addendum)
Please stop Pravachol, Start Crestor 20 mg daily. Continue all other medications as listed.  Please return in 10 weeks for fasting blood work.  Do not eat or drink after midnight the night before you come into the office for your blood work.  Follow up in 1 year with Dr Antoine Poche.  You will receive a letter in the mail 2 months before you are due.  Please call us when you receive this letter to schedule your follow up appointment.

## 2012-12-15 NOTE — Progress Notes (Signed)
HPI The patient presents for follow up of coronary artery disease with CABG in 2003.  He also has peripheral vascular disease status post lower extremity bypass.  After my last appointment with him I did schedule a stress perfusion study which demonstrated a well preserved ejection fraction without evidence of ischemia or infarct. He has seen his primary provider. I do note that he had a lipid profile done earlier this year with an LDL of 144. He tells me he was taking pravastatin at that time. His insurance apparently wouldn't cover Lipitor. The patient denies any new symptoms such as chest discomfort, neck or arm discomfort. There has been no new shortness of breath, PND or orthopnea. There have been no reported palpitations, presyncope or syncope.  He does have some lower extremity pain with known peripheral vascular disease and is followed up for this.  No Known Allergies  Current Outpatient Prescriptions  Medication Sig Dispense Refill  . amLODipine (NORVASC) 10 MG tablet Take 1 tablet (10 mg total) by mouth daily.  30 tablet  3  . aspirin 325 MG tablet Take 325 mg by mouth daily.      . carvedilol (COREG) 12.5 MG tablet Take 1 tablet (12.5 mg total) by mouth 2 (two) times daily with a meal.  60 tablet  3  . dorzolamide-timolol (COSOPT) 22.3-6.8 MG/ML ophthalmic solution Place 1 drop into both eyes 2 (two) times daily.       Marland Kitchen latanoprost (XALATAN) 0.005 % ophthalmic solution Place 0.005 mLs into both eyes Daily.      Marland Kitchen lisinopril (PRINIVIL,ZESTRIL) 20 MG tablet Take 1 tablet (20 mg total) by mouth daily.  30 tablet  3  . pravastatin (PRAVACHOL) 80 MG tablet Take 1 tablet (80 mg total) by mouth daily.  30 tablet  3  . tamsulosin (FLOMAX) 0.4 MG CAPS capsule Take 0.4 mg by mouth at bedtime.      . [DISCONTINUED] atorvastatin (LIPITOR) 80 MG tablet Take 80 mg by mouth daily.      . [DISCONTINUED] olmesartan-hydrochlorothiazide (BENICAR HCT) 40-12.5 MG per tablet Take 1 tablet by mouth daily.        No current facility-administered medications for this visit.    Past Medical History  Diagnosis Date  . Arthritis   . Hypertension   . Bronchitis   . Peripheral vascular disease     with claludication  . Hyperlipidemia     and Dyslipidemia  . CAD (coronary artery disease)   . PVD (peripheral vascular disease)     Past Surgical History  Procedure Laterality Date  . Femoral-femoral bpga  05/29/1999    Left profundoplasty  and Left Fem/Pop 05/28/2008  . Coronary artery bypass graft      LIMA to LAD, SVG to PDA, SVG to OM1/OM2  . Cataract extraction  June 2013    Cataract Right eye     ROS: Positive for constipation. Otherwise as stated in the HPI and negative for all other systems.  PHYSICAL EXAM BP 110/62  Pulse 45  Ht 5\' 6"  (1.676 m)  Wt 168 lb (76.204 kg)  BMI 27.13 kg/m2 GENERAL:  Well appearing HEENT:  Pupils equal round and reactive, fundi not visualized, oral mucosa unremarkable NECK:  No jugular venous distention, waveform within normal limits, carotid upstroke brisk and symmetric, no bruits, no thyromegaly LYMPHATICS:  No cervical, inguinal adenopathy LUNGS:  Clear to auscultation bilaterally BACK:  No CVA tenderness CHEST:  Well healed sternotomy scar. HEART:  PMI not displaced or sustained,S1  and S2 within normal limits, no S3, no S4, no clicks, no rubs, no murmurs ABD:  Flat, positive bowel sounds normal in frequency in pitch, no bruits, no rebound, no guarding, no midline pulsatile mass, no hepatomegaly, no splenomegaly EXT:  2 plus pulses upper, diminished DP/PT bilaterally, no edema, no cyanosis no clubbing SKIN:  No rashes no nodules NEURO:  Cranial nerves II through XII grossly intact, motor grossly intact throughout PSYCH:  Cognitively intact, oriented to person place and time  EKG:  Sinus bradycardia, rate 45, axis within normal limits, intervals within normal limits, no acute ST-T wave changes.  12/15/2012   ASSESSMENT AND PLAN  CAD - He had no  active ischemia on recent stress testing. He has no new symptoms. We will try with risk reduction.  Tobacco abuse - I discussed with him today again the need to stop smoking.  HTN - The blood pressure is at target. No change in medications is indicated. We will continue with therapeutic lifestyle changes (TLC).  HYPERLIPIDEMIA - He needs to have his lipid medications changed as he's not near target. He's not on guidelines driven dose of statin. I tried to have this conversation with him to make sure he understands the value in this and I will start Crestor 20 mg daily with a followup lipid profile in about 10 weeks.  BRADYCARDIA - He has no symptoms related to this. No change in therapy is indicated.

## 2012-12-18 ENCOUNTER — Other Ambulatory Visit: Payer: Self-pay | Admitting: Family Medicine

## 2013-03-02 ENCOUNTER — Other Ambulatory Visit (INDEPENDENT_AMBULATORY_CARE_PROVIDER_SITE_OTHER): Payer: Medicare Other

## 2013-03-02 DIAGNOSIS — E7800 Pure hypercholesterolemia, unspecified: Secondary | ICD-10-CM

## 2013-03-02 LAB — LIPID PANEL
HDL: 34.8 mg/dL — ABNORMAL LOW (ref >39.00)
LDL Cholesterol: 53 mg/dL (ref 0–99)
Total CHOL/HDL Ratio: 3
VLDL: 18.6 mg/dL (ref 0.0–40.0)

## 2013-06-12 ENCOUNTER — Encounter: Payer: Self-pay | Admitting: Vascular Surgery

## 2013-06-13 ENCOUNTER — Encounter: Payer: Self-pay | Admitting: Vascular Surgery

## 2013-06-14 ENCOUNTER — Encounter: Payer: Self-pay | Admitting: Vascular Surgery

## 2013-06-14 ENCOUNTER — Encounter (INDEPENDENT_AMBULATORY_CARE_PROVIDER_SITE_OTHER): Payer: Self-pay

## 2013-06-14 ENCOUNTER — Ambulatory Visit (HOSPITAL_COMMUNITY)
Admission: RE | Admit: 2013-06-14 | Discharge: 2013-06-14 | Disposition: A | Discharge status: Home / Self Care | Payer: Medicare Other | Source: Ambulatory Visit | Attending: Vascular Surgery | Admitting: Vascular Surgery

## 2013-06-14 ENCOUNTER — Ambulatory Visit (INDEPENDENT_AMBULATORY_CARE_PROVIDER_SITE_OTHER): Payer: Medicare Other | Admitting: Vascular Surgery

## 2013-06-14 VITALS — BP 115/63 | HR 50 | Ht 66.0 in | Wt 164.5 lb

## 2013-06-14 DIAGNOSIS — E785 Hyperlipidemia, unspecified: Secondary | ICD-10-CM | POA: Insufficient documentation

## 2013-06-14 DIAGNOSIS — F17200 Nicotine dependence, unspecified, uncomplicated: Secondary | ICD-10-CM | POA: Insufficient documentation

## 2013-06-14 DIAGNOSIS — Z9889 Other specified postprocedural states: Secondary | ICD-10-CM | POA: Insufficient documentation

## 2013-06-14 DIAGNOSIS — Z48812 Encounter for surgical aftercare following surgery on the circulatory system: Secondary | ICD-10-CM

## 2013-06-14 DIAGNOSIS — I739 Peripheral vascular disease, unspecified: Secondary | ICD-10-CM

## 2013-06-14 DIAGNOSIS — I70209 Unspecified atherosclerosis of native arteries of extremities, unspecified extremity: Secondary | ICD-10-CM | POA: Insufficient documentation

## 2013-06-14 DIAGNOSIS — I10 Essential (primary) hypertension: Secondary | ICD-10-CM | POA: Insufficient documentation

## 2013-06-14 NOTE — Progress Notes (Signed)
[facility]  Patient name: Cristian Hayden MRN: [id number] DOB: [birth year] Sex: male  REASON FOR VISIT: Follow up of peripheral vascular disease  HPI: Cristian SimmerRobert J Hayden is a 73 y.o. male who I last saw one year ago. He had undergone a right to left fem-fem bypass graft by Dr. Madilyn FiremanHayes in 2010. He been seen in follow up with an occluded fem-fem graft. We have been following his stable peripheral vascular disease.  Since I saw him last he has stable bilateral lower extremity claudication. His symptoms are brought on by ambulation and relieved with rest. He has no significant thigh are hip claudication. He denies rest pain or nonhealing ulcers. He does admit to occasionally having a cigarette.   Past Medical History  Diagnosis Date  . Arthritis   . Hypertension   . Bronchitis   . Peripheral vascular disease     with claludication  . Hyperlipidemia     and Dyslipidemia  . CAD (coronary artery disease)   . PVD (peripheral vascular disease)    Family History  Problem Relation Age of Onset  . Hypertension Mother   . Heart disease Father [id number]    CAD  . Hypertension Father   . Hypertension Sister   . Hypertension Brother    SOCIAL HISTORY: History  Substance Use Topics  . Smoking status: Current Some Day Smoker -- 0.20 packs/day    Types: Cigarettes  . Smokeless tobacco: Never Used     Comment: pt states he is trying, only smokes about 5 per day  . Alcohol Use: 0.6 oz/week    1 Glasses of wine per week   No Known Allergies  REVIEW OF SYSTEMS: Arly.Keller[X ] denotes positive finding; [  ] denotes negative finding  CARDIOVASCULAR:  [ ]  chest pain   [ ]  chest pressure   [ ]  palpitations   [ ]  orthopnea   [ ]  dyspnea on exertion   [ ]  claudication   [ ]  rest pain   [ ]  DVT   [ ]  phlebitis PULMONARY:   [ ]  productive cough   [ ]  asthma   [ ]  wheezing NEUROLOGIC:   [ ]  weakness  [ ]  paresthesias  [ ]  aphasia  [ ]  amaurosis  [ ]  dizziness HEMATOLOGIC:   [ ]  bleeding  problems   [ ]  clotting disorders MUSCULOSKELETAL:  [ ]  joint pain   [ ]  joint swelling [ ]  leg swelling GASTROINTESTINAL: [ ]   blood in stool  [ ]   hematemesis GENITOURINARY:  [ ]   dysuria  [ ]   hematuria PSYCHIATRIC:  [ ]  history of major depression INTEGUMENTARY:  [ ]  rashes  [ ]  ulcers CONSTITUTIONAL:  [ ]  fever   [ ]  chills  PHYSICAL EXAM: Filed Vitals:   06/14/13 1338  BP: 115/63  Pulse: 50  Height: 5\' 6"  (1.676 m)  Weight: 164 lb 8 oz (74.617 kg)  SpO2: 99%   Body mass index is 26.56 kg/(m^2). GENERAL: The patient is a well-nourished male, in no acute distress. The vital signs are documented above. CARDIOVASCULAR: There is a regular rate and rhythm. I do not detect carotid bruits. I cannot palpate femoral pulses. I cannot palpate pedal pulses. He has no significant lower extremity swelling. PULMONARY: There is good air exchange bilaterally without wheezing or rales. ABDOMEN: Soft and non-tender with normal pitched bowel sounds.  MUSCULOSKELETAL: There are no major deformities or cyanosis. NEUROLOGIC: No focal weakness or paresthesias are detected. SKIN:  There are no ischemic ulcers. PSYCHIATRIC: The patient has a normal affect.  DATA:  I have independently interpreted his arterial Doppler study which shows monophasic Doppler signals in the dorsalis pedis and posterior tibial positions bilaterally. ABI on the right is 54%. ABI on the left is 48%. These ABI's are stable compared to one year ago.   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 However his symptoms remained stable. I've encouraged to begin to quit smoking completely. We have discussed the importance of a structured walking program. He understands we would not consider arteriography and redo revascularization unless he developed rest pain or nonhealing ulcer. See him back in one year  with follow up ABIs. He knows to call sooner if he has problems.  Taheerah Guldin S [facility] [place]: [phone]

## 2013-06-14 NOTE — Assessment & Plan Note (Signed)
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 However his symptoms remained stable. I've encouraged to begin to quit smoking completely. We have discussed the importance of a structured walking program. He understands we would not consider arteriography and redo revascularization unless he developed rest pain or nonhealing ulcer. See him back in one year with follow up ABIs. He knows to call sooner if he has problems.

## 2013-06-14 NOTE — Addendum Note (Signed)
Addended by: Dannielle KarvonenMANESS-HARRISON, CHANDA C on: 06/14/2013 04:29 PM   Modules accepted: Orders

## 2013-11-13 ENCOUNTER — Emergency Department (HOSPITAL_COMMUNITY)
Admission: EM | Admit: 2013-11-13 | Discharge: 2013-11-13 | Disposition: A | Discharge status: Home / Self Care | Payer: Medicare Other | Attending: Emergency Medicine | Admitting: Emergency Medicine

## 2013-11-13 ENCOUNTER — Emergency Department (HOSPITAL_COMMUNITY): Payer: Medicare Other

## 2013-11-13 ENCOUNTER — Encounter (HOSPITAL_COMMUNITY): Payer: Self-pay | Admitting: Emergency Medicine

## 2013-11-13 ENCOUNTER — Emergency Department (HOSPITAL_COMMUNITY)
Admission: EM | Admit: 2013-11-13 | Discharge: 2013-11-13 | Disposition: A | Discharge status: Home / Self Care | Payer: Medicare Other | Source: Home / Self Care | Attending: Emergency Medicine | Admitting: Emergency Medicine

## 2013-11-13 DIAGNOSIS — N5089 Other specified disorders of the male genital organs: Secondary | ICD-10-CM

## 2013-11-13 DIAGNOSIS — Z7982 Long term (current) use of aspirin: Secondary | ICD-10-CM | POA: Insufficient documentation

## 2013-11-13 DIAGNOSIS — I2510 Atherosclerotic heart disease of native coronary artery without angina pectoris: Secondary | ICD-10-CM | POA: Insufficient documentation

## 2013-11-13 DIAGNOSIS — I10 Essential (primary) hypertension: Secondary | ICD-10-CM | POA: Insufficient documentation

## 2013-11-13 DIAGNOSIS — Z951 Presence of aortocoronary bypass graft: Secondary | ICD-10-CM | POA: Insufficient documentation

## 2013-11-13 DIAGNOSIS — Z79899 Other long term (current) drug therapy: Secondary | ICD-10-CM | POA: Insufficient documentation

## 2013-11-13 DIAGNOSIS — M129 Arthropathy, unspecified: Secondary | ICD-10-CM | POA: Insufficient documentation

## 2013-11-13 DIAGNOSIS — Z8709 Personal history of other diseases of the respiratory system: Secondary | ICD-10-CM | POA: Insufficient documentation

## 2013-11-13 DIAGNOSIS — L729 Follicular cyst of the skin and subcutaneous tissue, unspecified: Secondary | ICD-10-CM

## 2013-11-13 DIAGNOSIS — N508 Other specified disorders of male genital organs: Secondary | ICD-10-CM

## 2013-11-13 DIAGNOSIS — N50819 Testicular pain, unspecified: Secondary | ICD-10-CM

## 2013-11-13 DIAGNOSIS — F17200 Nicotine dependence, unspecified, uncomplicated: Secondary | ICD-10-CM | POA: Insufficient documentation

## 2013-11-13 DIAGNOSIS — N509 Disorder of male genital organs, unspecified: Secondary | ICD-10-CM

## 2013-11-13 DIAGNOSIS — E785 Hyperlipidemia, unspecified: Secondary | ICD-10-CM | POA: Insufficient documentation

## 2013-11-13 LAB — POCT URINALYSIS DIP (DEVICE)
BILIRUBIN URINE: NEGATIVE
Glucose, UA: NEGATIVE mg/dL
HGB URINE DIPSTICK: NEGATIVE
KETONES UR: NEGATIVE mg/dL
Leukocytes, UA: NEGATIVE
Nitrite: NEGATIVE
PH: 5.5 (ref 5.0–8.0)
PROTEIN: 30 mg/dL — AB
Specific Gravity, Urine: >=1.03 (ref 1.005–1.030)
Urobilinogen, UA: 0.2 mg/dL (ref 0.0–1.0)

## 2013-11-13 LAB — URINALYSIS, ROUTINE W REFLEX MICROSCOPIC
Bilirubin Urine: NEGATIVE
GLUCOSE, UA: NEGATIVE mg/dL
Hgb urine dipstick: NEGATIVE
Ketones, ur: NEGATIVE mg/dL
LEUKOCYTES UA: NEGATIVE
NITRITE: NEGATIVE
PROTEIN: NEGATIVE mg/dL
Specific Gravity, Urine: 1.012 (ref 1.005–1.030)
Urobilinogen, UA: 0.2 mg/dL (ref 0.0–1.0)
pH: 6.5 (ref 5.0–8.0)

## 2013-11-13 LAB — I-STAT CHEM 8, ED
BUN: 11 mg/dL (ref 6–23)
CALCIUM ION: 1.21 mmol/L (ref 1.13–1.30)
Chloride: 105 mEq/L (ref 96–112)
Creatinine, Ser: 1 mg/dL (ref 0.50–1.35)
Glucose, Bld: 123 mg/dL — ABNORMAL HIGH (ref 70–99)
HEMATOCRIT: 43 % (ref 39.0–52.0)
HEMOGLOBIN: 14.6 g/dL (ref 13.0–17.0)
Potassium: 3.5 mEq/L — ABNORMAL LOW (ref 3.7–5.3)
SODIUM: 142 meq/L (ref 137–147)
TCO2: 21 mmol/L (ref 0–100)

## 2013-11-13 MED ORDER — HYDROCODONE-ACETAMINOPHEN 5-325 MG PO TABS
1.0000 | ORAL_TABLET | Freq: Once | ORAL | Status: AC
Start: 2013-11-13 — End: 2013-11-13
  Administered 2013-11-13: 1 via ORAL
  Filled 2013-11-13: qty 1

## 2013-11-13 MED ORDER — IOHEXOL 300 MG/ML  SOLN
100.0000 mL | Freq: Once | INTRAMUSCULAR | Status: AC | PRN
  Administered 2013-11-13: 100 mL via INTRAVENOUS

## 2013-11-13 MED ORDER — IOHEXOL 300 MG/ML  SOLN
25.0000 mL | Freq: Once | INTRAMUSCULAR | Status: AC | PRN
  Administered 2013-11-13: 25 mL via ORAL

## 2013-11-13 NOTE — ED Notes (Signed)
Pt c/o groin/penis pain x4 days Denies inj/trauma, abd/back pain, f/v/n/d, dysuria, hematuria  Sx include urinary retention; pain is 6/10; increases w/pressure Alert w/no signs of acute distress.

## 2013-11-13 NOTE — ED Provider Notes (Signed)
Medical screening examination/treatment/procedure(s) were performed by non-physician practitioner and as supervising physician I was immediately available for consultation/collaboration.   EKG Interpretation None        Dagmar HaitWilliam Liyana Suniga, MD 11/13/13 2316

## 2013-11-13 NOTE — ED Provider Notes (Signed)
CSN: [id number]     Arrival date & time 11/13/13  1129 History   First MD Initiated Contact with Patient 11/13/13 1144     Chief Complaint  Patient presents with  . Testicle Pain     (Consider location/radiation/quality/duration/timing/severity/associated sxs/prior Treatment) The history is provided by the patient and medical records.   This is a 73 year old male with past medical history significant for hypertension, arthritis, hyperlipidemia, coronary artery disease, peripheral vascular disease, presenting to the ED for right testicle pain.  Patient states pain began on Friday evening, described as an ache, made worse when sitting upright. He states pain is not getting any worse, however he feels pain should have resolved by now.  He denies difficulty urinating, dysuria, hematuria, or abdominal pain.  Pt has no prior hx of hernia or previous genital surgeries.  No fever or chills.  Past Medical History  Diagnosis Date  . Arthritis   . Hypertension   . Bronchitis   . Peripheral vascular disease     with claludication  . Hyperlipidemia     and Dyslipidemia  . CAD (coronary artery disease)   . PVD (peripheral vascular disease)    Past Surgical History  Procedure Laterality Date  . Femoral-femoral bpga  05/29/1999    Left profundoplasty  and Left Fem/Pop 05/28/2008  . Coronary artery bypass graft      LIMA to LAD, SVG to PDA, SVG to OM1/OM2  . Cataract extraction  June 2013    Cataract Right eye   Family History  Problem Relation Age of Onset  . Hypertension Mother   . Heart disease Father 5588    CAD  . Hypertension Father   . Hypertension Sister   . Hypertension Brother    History  Substance Use Topics  . Smoking status: Current Some Day Smoker -- 0.20 packs/day    Types: Cigarettes  . Smokeless tobacco: Never Used     Comment: pt states he is trying, only smokes about 5 per day  . Alcohol Use: 0.6 oz/week    1 Glasses of wine per week    Review of Systems    Genitourinary: Positive for scrotal swelling and testicular pain.  All other systems reviewed and are negative.     Allergies  Review of patient's allergies indicates no known allergies.  Home Medications   Prior to Admission medications   Medication Sig Start Date End Date Taking? Authorizing Provider  amLODipine (NORVASC) 10 MG tablet Take 1 tablet (10 mg total) by mouth daily. 09/12/12  Yes Shanker Levora DredgeM Ghimire, MD  aspirin 325 MG tablet Take 325 mg by mouth daily.   Yes Historical Provider, MD  carvedilol (COREG) 12.5 MG tablet Take 1 tablet (12.5 mg total) by mouth 2 (two) times daily with a meal. 09/12/12  Yes Shanker Levora DredgeM Ghimire, MD  dorzolamide-timolol (COSOPT) 22.3-6.8 MG/ML ophthalmic solution Place 1 drop into both eyes 2 (two) times daily.  07/22/11  Yes Historical Provider, MD  latanoprost (XALATAN) 0.005 % ophthalmic solution Place 1 drop into both eyes Daily.  08/06/11  Yes Historical Provider, MD  lisinopril (PRINIVIL,ZESTRIL) 20 MG tablet Take 1 tablet (20 mg total) by mouth daily. 09/12/12  Yes Shanker Levora DredgeM Ghimire, MD  rosuvastatin (CRESTOR) 20 MG tablet Take 1 tablet (20 mg total) by mouth daily. 12/15/12  Yes Rollene RotundaJames Hochrein, MD  tamsulosin (FLOMAX) 0.4 MG CAPS capsule Take 0.4 mg by mouth at bedtime.   Yes Historical Provider, MD   BP 134/60  Pulse 48  Resp 14  SpO2 97%  Physical Exam  Nursing note and vitals reviewed. Constitutional: He is oriented to person, place, and time. He appears well-developed and well-nourished. No distress.  HENT:  Head: Normocephalic and atraumatic.  Mouth/Throat: Oropharynx is clear and moist.  Eyes: Conjunctivae and EOM are normal. Pupils are equal, round, and reactive to light.  Neck: Normal range of motion. Neck supple.  Cardiovascular: Normal rate, regular rhythm and normal heart sounds.   Pulmonary/Chest: Effort normal and breath sounds normal. No respiratory distress. He has no wheezes.  Abdominal: Soft. Bowel sounds are normal. There  is no tenderness. There is no guarding.  Genitourinary: Penis normal.    Right testis shows mass, swelling and tenderness. Left testis shows no mass and no tenderness. Uncircumcised. No penile tenderness. No discharge found.  Firm mass like structure present along right groin extending into upper lateral testicle; no induration, cellulitis, or warmth to touch; slight swelling of right testicle when compared with left left testicle and penis normal  Musculoskeletal: Normal range of motion.  Neurological: He is alert and oriented to person, place, and time.  Skin: Skin is warm and dry. He is not diaphoretic.  Psychiatric: He has a normal mood and affect.    ED Course  Procedures (including critical care time) Labs Review Labs Reviewed  URINALYSIS, ROUTINE W REFLEX MICROSCOPIC    Imaging Review US Scrotum  11/13/2013   CLINICAL DATA:  Right-sided testicular pain  EXAM: SCROTAL ULTRASOUND  DOPPLER ULTRASOUND OF THE TESTICLES  TECHNIQUE: Complete ultrasound examination of the testicles, epididymis, and other scrotal structures was performed. Color and spectral Doppler ultrasound were also utilized to evaluate blood flow to the testicles.  COMPARISON:  None.  FINDINGS: Right testicle  Measurements: 3.9 x 3.5 x 2.8 cm. Simple appearing intratesticular cyst measures 1.3 x 1.3 x 1.2 cm. No mass or microlithiasis visualized.  Left testicle  Measurements: 4.6 x 2.8 x 2.0 cm. No mass or microlithiasis visualized.  Right epididymis:  8 mm simple appearing cyst incidentally noted.  Left epididymis:  1.1 cm simple appearing cyst incidentally noted.  Hydrocele:  Moderate right, small left  Varicocele:  None visualized.  Pulsed Doppler interrogation of both testes demonstrates low resistance arterial and venous waveforms bilaterally.  IMPRESSION: No sonographic evidence for testicular torsion or acute abnormality.  Simple appearing probably benign right testicular cyst incidentally noted. No further imaging  followup is needed for this benign appearing finding.   Electronically Signed   By: Christiana Pellant M.D.   On: 11/13/2013 13:06   [place] Art/ven Flow Abd Pelv Doppler  11/13/2013   CLINICAL DATA:  Right-sided testicular pain  EXAM: SCROTAL ULTRASOUND  DOPPLER ULTRASOUND OF THE TESTICLES  TECHNIQUE: Complete ultrasound examination of the testicles, epididymis, and other scrotal structures was performed. Color and spectral Doppler ultrasound were also utilized to evaluate blood flow to the testicles.  COMPARISON:  None.  FINDINGS: Right testicle  Measurements: 3.9 x 3.5 x 2.8 cm. Simple appearing intratesticular cyst measures 1.3 x 1.3 x 1.2 cm. No mass or microlithiasis visualized.  Left testicle  Measurements: 4.6 x 2.8 x 2.0 cm. No mass or microlithiasis visualized.  Right epididymis:  8 mm simple appearing cyst incidentally noted.  Left epididymis:  1.1 cm simple appearing cyst incidentally noted.  Hydrocele:  Moderate right, small left  Varicocele:  None visualized.  Pulsed Doppler interrogation of both testes demonstrates low resistance arterial and venous waveforms bilaterally.  IMPRESSION: No sonographic evidence for testicular torsion or acute  abnormality.  Simple appearing probably benign right testicular cyst incidentally noted. No further imaging followup is needed for this benign appearing finding.   Electronically Signed   By: Christiana Pellant M.D.   On: 11/13/2013 13:06     EKG Interpretation None      MDM   Final diagnoses:  Testicle pain  Groin mass   73 year old male with ongoing right testicle pain for the past 3 days.  On exam he does have a firm mass present along his right groin extending into upper lateral right testicle.  No induration, cellulitis change, or warmth to touch. Left testicle and penis normal.  Will obtain u/a, u/s scrotum and doppler for further eval.  vVcodin given for pain.  Ultrasound revealing benign right testicular cyst, no evidence of testicular torsion.   Remaining clinical concern for possible incarcerated hernia.  Case discussed with attending physician, Dr. Micheline Maze who has personally evaluated pt-- agrees with ordering CT pelvis to r/o incarcerated inguinal hernia.  U/a pending.  Care signed out to PA Kirichenko-- Will follow CT scan and u/a, disposition accordingly.  Garlon Hatchet, PA-C 11/13/13 1551

## 2013-11-13 NOTE — ED Notes (Signed)
Onset Friday. PT states the pain was worsening a little today so he went to Urgent Care to have it checked out. UC sent PT to ED for further evaluation. PT denies hx of testicle pain in the past and denies edema in R testicle.

## 2013-11-13 NOTE — ED Provider Notes (Signed)
Medical screening examination/treatment/procedure(s) were conducted as a shared visit with non-physician practitioner(s) and myself.  I personally evaluated the patient during the encounter. Pt sent from UC with L scrotal pain. He has tender palpable, nonreducible mass in R scrotum, separate from testicle. Suspect, hernia.  Will place ice, elevated scrotum and will get CT pelvis. US w/ intratesticular cyst which I do not think is causing pain.    EKG Interpretation None        Shanna CiscoMegan E Celita Aron, MD 11/13/13 1858

## 2013-11-13 NOTE — ED Notes (Signed)
Testicles elevated and ice pack placed. Bed also tilted back to provide additional elevation to testicle

## 2013-11-13 NOTE — ED Provider Notes (Signed)
CSN: [id number]     Arrival date & time 11/13/13  1012 History   First MD Initiated Contact with Patient 11/13/13 1102     Chief Complaint  Patient presents with  . Groin Pain   (Consider location/radiation/quality/duration/timing/severity/associated sxs/prior Treatment) HPI Comments: 73 year old male presents for evaluation of testicular pain and swelling. This started on Friday, over 2 days ago, and has gotten gradually more swollen and painful. He has a bump near his right testicle is very swollen and tender. The pain is increased with any movement and with sitting down. He has not had a bowel movement since this pain began. No vomiting, no fever. He is experiencing difficulty urinating.  Patient is a 73 y.o. male presenting with groin pain.  Groin Pain    Past Medical History  Diagnosis Date  . Arthritis   . Hypertension   . Bronchitis   . Peripheral vascular disease     with claludication  . Hyperlipidemia     and Dyslipidemia  . CAD (coronary artery disease)   . PVD (peripheral vascular disease)    Past Surgical History  Procedure Laterality Date  . Femoral-femoral bpga  05/29/1999    Left profundoplasty  and Left Fem/Pop 05/28/2008  . Coronary artery bypass graft      LIMA to LAD, SVG to PDA, SVG to OM1/OM2  . Cataract extraction  June 2013    Cataract Right eye   Family History  Problem Relation Age of Onset  . Hypertension Mother   . Heart disease Father 37    CAD  . Hypertension Father   . Hypertension Sister   . Hypertension Brother    History  Substance Use Topics  . Smoking status: Current Some Day Smoker -- 0.20 packs/day    Types: Cigarettes  . Smokeless tobacco: Never Used     Comment: pt states he is trying, only smokes about 5 per day  . Alcohol Use: 0.6 oz/week    1 Glasses of wine per week    Review of Systems  Gastrointestinal: Positive for constipation.  Genitourinary: Positive for decreased urine volume, scrotal swelling, penile pain and  testicular pain.  All other systems reviewed and are negative.   Allergies  Review of patient's allergies indicates no known allergies.  Home Medications   Prior to Admission medications   Medication Sig Start Date End Date Taking? Authorizing Provider  amLODipine (NORVASC) 10 MG tablet Take 1 tablet (10 mg total) by mouth daily. 09/12/12  Yes Shanker Levora Dredge, MD  aspirin 325 MG tablet Take 325 mg by mouth daily.   Yes Historical Provider, MD  carvedilol (COREG) 12.5 MG tablet Take 1 tablet (12.5 mg total) by mouth 2 (two) times daily with a meal. 09/12/12  Yes Shanker Levora Dredge, MD  lisinopril (PRINIVIL,ZESTRIL) 20 MG tablet Take 1 tablet (20 mg total) by mouth daily. 09/12/12  Yes Shanker Levora Dredge, MD  rosuvastatin (CRESTOR) 20 MG tablet Take 1 tablet (20 mg total) by mouth daily. 12/15/12  Yes Rollene Rotunda, MD  tamsulosin (FLOMAX) 0.4 MG CAPS capsule Take 0.4 mg by mouth at bedtime.   Yes Historical Provider, MD  dorzolamide-timolol (COSOPT) 22.3-6.8 MG/ML ophthalmic solution Place 1 drop into both eyes 2 (two) times daily.  07/22/11   Historical Provider, MD  latanoprost (XALATAN) 0.005 % ophthalmic solution Place 0.005 mLs into both eyes Daily. 08/06/11   Historical Provider, MD   BP 112/72  Pulse 50  Temp(Src) 98.5 F (36.9 C) (Oral)  Resp 18  Ht 5\' 6"  (1.676 m)  Wt 160 lb (72.576 kg)  BMI 25.84 kg/m2  SpO2 98% Physical Exam  Nursing note and vitals reviewed. Constitutional: He is oriented to person, place, and time. He appears well-developed and well-nourished. No distress.  HENT:  Head: Normocephalic.  Pulmonary/Chest: Effort normal. No respiratory distress.  Abdominal: A hernia is present. Hernia confirmed positive in the right inguinal area.  Genitourinary: Penis normal. Right testis shows mass (at inguinal ring), swelling (diffuse right-sided scrotal swelling ) and tenderness.  Neurological: He is alert and oriented to person, place, and time. Coordination normal.   Skin: Skin is warm and dry. No rash noted. He is not diaphoretic.  Psychiatric: He has a normal mood and affect. Judgment normal.    ED Course  Procedures (including critical care time) Labs Review Labs Reviewed  POCT URINALYSIS DIP (DEVICE) - Abnormal; Notable for the following:    Protein, ur 30 (*)    All other components within normal limits    Imaging Review No results found.   MDM   1. Scrotal mass   2. Scrotal swelling   3. Testicular pain    Incarcerated indirect inguinal hernia versus epididymitis, transferred to the emergency department for further evaluation and imaging studies     Graylon GoodZachary H Barb Shear, PA-C 11/13/13 1112

## 2013-11-13 NOTE — Discharge Instructions (Signed)
Your ultrasound and CT scan showed a cyst on the right testicle. Please follow with primary care doctor or urology. Return if symptoms are worsening.   Hydrocele, Adult Fluid can collect around the testicles. This fluid forms in a sac. This condition is called a hydrocele. The collected fluid causes swelling of the scrotum. Usually, it affects just one testicle. Most of the time, the condition does not cause pain. Sometimes, the hydrocele goes away on its own. Other times, surgery is needed to get rid of the fluid. CAUSES A hydrocele does not develop often. Different things can cause a hydrocele in a man, including:  Injury to the scrotum.  Infection.  X-ray of the area around the scrotum.  A tumor or cancer of the testicle.  Twisting of a testicle.  Decreased blood flow to the scrotum. SYMPTOMS   Swelling without pain. The hydrocele feels like a water-filled balloon.  Swelling with pain. This can occur if the hydrocele was caused by infection or twisting.  Mild discomfort in the scrotum.  The hydrocele may feel heavy.  Swelling that gets smaller when you lie down. DIAGNOSIS  Your caregiver will do a physical exam to decide if you have a hydrocele. This may include:  Asking questions about your overall health, today and in the past. Your caregiver may ask about any injuries, X-rays, or infections.  Pushing on your abdomen or asking you to change positions to see if the size of the hydrocele changes.  Shining a light through the scrotum (transillumination) to see if the fluid inside the scrotum is clear.  Blood tests and urine tests to check for infection.  Imaging studies that take pictures of the scrotum and testicles. TREATMENT  Treatment depends in part on what caused the condition. Options include:  Watchful waiting. Your caregiver checks the hydrocele every so often.  Different surgeries to drain the fluid.  A needle may be put into the scrotum to drain fluid  (needle aspiration). Fluid often returns after this type of treatment.  A cut (incision) may be made in the scrotum to remove the fluid sac (hydrocelectomy).  An incision may be made in the groin to repair a hydrocele that has contact with abdominal fluids (communicating hydrocele).  Medicines to treat an infection (antibiotics). HOME CARE INSTRUCTIONS  What you need to do at home may depend on the cause of the hydrocele and type of treatment. In general:  Take all medicine as directed by your caregiver. Follow the directions carefully.  Ask your caregiver if there is anything you should not do while you recover (activities, lifting, work, sex).  If you had surgery to repair a communicating hydrocele, recovery time may vary. Ask you caregiver about your recovery time.  Avoid heavy lifting for 4 to 6 weeks.  If you had an incision on the scrotum or groin, wash it for 2 to 3 days after surgery. Do this as long as the skin is closed and there are no gaps in the wound. Wash gently, and avoid rubbing the incision.  Keep all follow-up appointments. SEEK MEDICAL CARE IF:   Your scrotum seems to be getting larger.  The area becomes more and more uncomfortable. SEEK IMMEDIATE MEDICAL CARE IF:  You have a fever. Document Released: 10/01/2009 Document Revised: 02/01/2013 Document Reviewed: 10/01/2009 [facility] Patient Information 2015 [place] [place]. This information is not intended to replace advice given to you by your health care provider. Make sure you discuss any questions you have with your health care  provider.

## 2013-11-13 NOTE — ED Notes (Signed)
Pain in groin since Friday night has a knot  [facility] and sent for further tests

## 2013-11-13 NOTE — ED Notes (Addendum)
Called CT -PT completed contrast dye. Also, have asked PT for urine specimen 5-6x but he has not felt the urge to urinate

## 2013-11-13 NOTE — ED Provider Notes (Signed)
Medical screening examination/treatment/procedure(s) were performed by resident physician or non-physician practitioner and as supervising physician I was immediately available for consultation/collaboration.  Randal BubaErin Avelyn Touch, MD     Charm RingsErin J Emilija Bohman, MD 11/13/13 [phone]

## 2013-11-13 NOTE — ED Provider Notes (Signed)
Patient signed out to me at shift change, he presented today to emergency department complaining of testicular pain and swelling that started 2 days ago. He reported feeling a bump on his right testicle. Patient already had ultrasound done today, which showed right testicular cyst, most likely benign. There is still some concern about possible hernia, CT of the pelvis was ordered and urinalysis pending.  Results for orders placed during the hospital encounter of 11/13/13  URINALYSIS, ROUTINE W REFLEX MICROSCOPIC      Result Value Ref Range   Color, Urine YELLOW  YELLOW   APPearance CLEAR  CLEAR   Specific Gravity, Urine 1.012  1.005 - 1.030   pH 6.5  5.0 - 8.0   Glucose, UA NEGATIVE  NEGATIVE mg/dL   Hgb urine dipstick NEGATIVE  NEGATIVE   Bilirubin Urine NEGATIVE  NEGATIVE   Ketones, ur NEGATIVE  NEGATIVE mg/dL   Protein, ur NEGATIVE  NEGATIVE mg/dL   Urobilinogen, UA 0.2  0.0 - 1.0 mg/dL   Nitrite NEGATIVE  NEGATIVE   Leukocytes, UA NEGATIVE  NEGATIVE  I-STAT CHEM 8, ED      Result Value Ref Range   Sodium 142  137 - 147 mEq/L   Potassium 3.5 (*) 3.7 - 5.3 mEq/L   Chloride 105  96 - 112 mEq/L   BUN 11  6 - 23 mg/dL   Creatinine, Ser 9.141.00  0.50 - 1.35 mg/dL   Glucose, Bld 782123 (*) 70 - 99 mg/dL   Calcium, Ion 9.561.21  2.131.13 - 1.30 mmol/L   TCO2 21  0 - 100 mmol/L   Hemoglobin 14.6  13.0 - 17.0 g/dL   HCT 08.643.0  57.839.0 - 46.952.0 %   Ct Pelvis W Contrast  11/13/2013   CLINICAL DATA:  Scrotal pain, masses.  Question hernia.  EXAM: CT PELVIS WITH CONTRAST  TECHNIQUE: Multidetector CT imaging of the pelvis was performed using the standard protocol following the bolus administration of intravenous contrast.  CONTRAST:  100mL OMNIPAQUE IOHEXOL 300 MG/ML  SOLN  COMPARISON:  Scrotal ultrasound performed today.  FINDINGS: No evidence of inguinal hernia. There is a fem-fem crossover graft noted within the subcutaneous lower pelvic soft tissues. No inguinal adenopathy.  Small intratesticular cyst noted on  the right as seen and better characterized by today's ultrasound. Bilateral hydroceles also noted as seen on ultrasound.  Visualized large and small bowel are unremarkable. Prostate is mildly enlarged. Urinary bladder grossly unremarkable. No free fluid, free air or adenopathy.  No acute bony abnormality.  IMPRESSION: Bilateral hydroceles as seen on today's ultrasound.  No evidence of inguinal hernia.  Fem-fem crossover graft noted.   Electronically Signed   By: Charlett NoseKevin  Dover M.D.   On: 11/13/2013 16:19   [place] Scrotum  11/13/2013   CLINICAL DATA:  Right-sided testicular pain  EXAM: SCROTAL ULTRASOUND  DOPPLER ULTRASOUND OF THE TESTICLES  TECHNIQUE: Complete ultrasound examination of the testicles, epididymis, and other scrotal structures was performed. Color and spectral Doppler ultrasound were also utilized to evaluate blood flow to the testicles.  COMPARISON:  None.  FINDINGS: Right testicle  Measurements: 3.9 x 3.5 x 2.8 cm. Simple appearing intratesticular cyst measures 1.3 x 1.3 x 1.2 cm. No mass or microlithiasis visualized.  Left testicle  Measurements: 4.6 x 2.8 x 2.0 cm. No mass or microlithiasis visualized.  Right epididymis:  8 mm simple appearing cyst incidentally noted.  Left epididymis:  1.1 cm simple appearing cyst incidentally noted.  Hydrocele:  Moderate right, small left  Varicocele:  None visualized.  Pulsed Doppler interrogation of both testes demonstrates low resistance arterial and venous waveforms bilaterally.  IMPRESSION: No sonographic evidence for testicular torsion or acute abnormality.  Simple appearing probably benign right testicular cyst incidentally noted. No further imaging followup is needed for this benign appearing finding.   Electronically Signed   By: Christiana Pellant M.D.   On: 11/13/2013 13:06   [place] Art/ven Flow Abd Pelv Doppler  11/13/2013   CLINICAL DATA:  Right-sided testicular pain  EXAM: SCROTAL ULTRASOUND  DOPPLER ULTRASOUND OF THE TESTICLES  TECHNIQUE: Complete  ultrasound examination of the testicles, epididymis, and other scrotal structures was performed. Color and spectral Doppler ultrasound were also utilized to evaluate blood flow to the testicles.  COMPARISON:  None.  FINDINGS: Right testicle  Measurements: 3.9 x 3.5 x 2.8 cm. Simple appearing intratesticular cyst measures 1.3 x 1.3 x 1.2 cm. No mass or microlithiasis visualized.  Left testicle  Measurements: 4.6 x 2.8 x 2.0 cm. No mass or microlithiasis visualized.  Right epididymis:  8 mm simple appearing cyst incidentally noted.  Left epididymis:  1.1 cm simple appearing cyst incidentally noted.  Hydrocele:  Moderate right, small left  Varicocele:  None visualized.  Pulsed Doppler interrogation of both testes demonstrates low resistance arterial and venous waveforms bilaterally.  IMPRESSION: No sonographic evidence for testicular torsion or acute abnormality.  Simple appearing probably benign right testicular cyst incidentally noted. No further imaging followup is needed for this benign appearing finding.   Electronically Signed   By: Christiana Pellant M.D.   On: 11/13/2013 13:06     5:52 PM CT of the pelvis normal other than bilateral hydroceles which were seen on ultrasound. No evidence of inguinal hernia noted. Patient's urine analysis is normal. I discussed results with him, we'll discharge home with followup with his primary care Dr. Patient voiced understanding and agreeable to the plan.   Filed Vitals:   11/13/13 1430 11/13/13 1500 11/13/13 1530 11/13/13 1630  BP: 115/54 134/60 133/58 127/66  Pulse: 51 48 60 52  Resp: 15 14 15    SpO2: 97% 97% 95% 96%     Dontaye Hur A Waris Rodger, PA-C 11/13/13 1755

## 2013-12-29 ENCOUNTER — Other Ambulatory Visit: Payer: Self-pay

## 2013-12-29 MED ORDER — ROSUVASTATIN CALCIUM 20 MG PO TABS: 20.0000 mg | ORAL_TABLET | Freq: Every day | ORAL | Status: DC

## 2014-01-03 ENCOUNTER — Encounter (HOSPITAL_COMMUNITY): Payer: Self-pay | Admitting: *Deleted

## 2014-01-03 ENCOUNTER — Encounter: Payer: Self-pay | Admitting: Cardiology

## 2014-01-03 ENCOUNTER — Ambulatory Visit (INDEPENDENT_AMBULATORY_CARE_PROVIDER_SITE_OTHER): Payer: Medicare Other | Admitting: Cardiology

## 2014-01-03 VITALS — BP 148/78 | HR 57 | Ht 66.0 in | Wt 162.5 lb

## 2014-01-03 DIAGNOSIS — R079 Chest pain, unspecified: Secondary | ICD-10-CM

## 2014-01-03 DIAGNOSIS — I25810 Atherosclerosis of coronary artery bypass graft(s) without angina pectoris: Secondary | ICD-10-CM

## 2014-01-03 NOTE — Progress Notes (Signed)
HPI The patient presents for follow up of coronary artery disease with CABG in 2003.  He also has peripheral vascular disease status post lower extremity bypass.  Last year he did have a stress perfusion study that was negative for any evidence of ischemia. He says he's not some discomfort in his chest with activities. This is new in onset. It happens when he is doing yard work or walking a longer distance. He gets a vague discomfort in his left chest. He does not have any memory of his previous symptoms other than "falling backwards" prior to his bypass. He's not describing any radiation of the discomfort to the jaw or to his arms. He's not describing any associated symptoms such as nausea vomiting or diaphoresis. He's not been having any shortness of breath, PND or orthopnea. He's had no weight gain or edema.  Of note he says he was told to get a followup appointment when he saw his primary care physician last month because his "heart was out of rhythm". I don't have any details on this.  No Known Allergies  Current Outpatient Prescriptions  Medication Sig Dispense Refill  . amLODipine (NORVASC) 10 MG tablet Take 1 tablet (10 mg total) by mouth daily.  30 tablet  3  . aspirin 325 MG tablet Take 325 mg by mouth daily.      . carvedilol (COREG) 12.5 MG tablet Take 1 tablet (12.5 mg total) by mouth 2 (two) times daily with a meal.  60 tablet  3  . dorzolamide-timolol (COSOPT) 22.3-6.8 MG/ML ophthalmic solution Place 1 drop into both eyes 2 (two) times daily.       Marland Kitchen latanoprost (XALATAN) 0.005 % ophthalmic solution Place 1 drop into both eyes Daily.       Marland Kitchen lisinopril (PRINIVIL,ZESTRIL) 20 MG tablet Take 1 tablet (20 mg total) by mouth daily.  30 tablet  3  . pentoxifylline (TRENTAL) 400 MG CR tablet Take 1 tablet by mouth daily.      . rosuvastatin (CRESTOR) 20 MG tablet Take 1 tablet (20 mg total) by mouth daily.  30 tablet  1  . tamsulosin (FLOMAX) 0.4 MG CAPS capsule Take 0.4 mg by mouth at  bedtime.      . [DISCONTINUED] atorvastatin (LIPITOR) 80 MG tablet Take 80 mg by mouth daily.      . [DISCONTINUED] olmesartan-hydrochlorothiazide (BENICAR HCT) 40-12.5 MG per tablet Take 1 tablet by mouth daily.       No current facility-administered medications for this visit.    Past Medical History  Diagnosis Date  . Arthritis   . Hypertension   . Bronchitis   . Peripheral vascular disease     with claludication  . Hyperlipidemia     and Dyslipidemia  . CAD (coronary artery disease)   . PVD (peripheral vascular disease)     Past Surgical History  Procedure Laterality Date  . Femoral-femoral bpga  05/29/1999    Left profundoplasty  and Left Fem/Pop 05/28/2008  . Coronary artery bypass graft      LIMA to LAD, SVG to PDA, SVG to OM1/OM2  . Cataract extraction  June 2013    Cataract Right eye     ROS: Positive for constipation. Otherwise as stated in the HPI and negative for all other systems.  PHYSICAL EXAM BP 148/78  Pulse 57  Ht  (1.676 m)  Wt 162 lb 8 oz (73.71 kg)  BMI 26.24 kg/m2 GENERAL:  Well appearing HEENT:  Pupils equal round and  reactive, fundi not visualized, oral mucosa unremarkable NECK:  No jugular venous distention, waveform within normal limits, carotid upstroke brisk and symmetric, no bruits, no thyromegaly LYMPHATICS:  No cervical, inguinal adenopathy LUNGS:  Clear to auscultation bilaterally BACK:  No CVA tenderness CHEST:  Well healed sternotomy scar. HEART:  PMI not displaced or sustained,S1 and S2 within normal limits, no S3, no S4, no clicks, no rubs, no murmurs ABD:  Flat, positive bowel sounds normal in frequency in pitch, no bruits, no rebound, no guarding, no midline pulsatile mass, no hepatomegaly, no splenomegaly EXT:  2 plus pulses upper, diminished DP/PT bilaterally, no edema, no cyanosis no clubbing SKIN:  No rashes no nodules NEURO:  Cranial nerves II through XII grossly intact, motor grossly intact throughout PSYCH:  Cognitively  intact, oriented to person place and time  EKG:  Sinus bradycardia, rate 57, axis within normal limits, intervals within normal limits, no acute ST-T wave changes.  01/03/2014   ASSESSMENT AND PLAN  CAD - Given the new onset symptoms with 73 year old bypass grafts I have a low threshold for stress testing. He says he would not be a walk on a treadmill because of some difficulty ambulating. Therefore, he will have a [employer].  Tobacco abuse - I discussed with him today again the need to stop smoking.  HTN - The blood pressure is at target. No change in medications is indicated. We will continue with therapeutic lifestyle changes (TLC).  HYPERLIPIDEMIA - I will try to get a copy of her most recent lipid profile. For now he will remain on the meds as listed.  BRADYCARDIA - The patient does mention some dysrhythmia reportedly and though he doesn't feel any palpitations. I will try to contact DEAN, ERIC, MD (the office is closed today) to see what arrhythmia might have been noted.

## 2014-01-03 NOTE — Patient Instructions (Signed)
We are ordering a stress test  Your physician recommends that you schedule a follow-up appointment in:  One year with dr. Antoine Poche

## 2014-01-11 ENCOUNTER — Telehealth (HOSPITAL_COMMUNITY): Payer: Self-pay

## 2014-01-11 NOTE — Telephone Encounter (Signed)
Encounter complete. 

## 2014-01-16 ENCOUNTER — Ambulatory Visit (HOSPITAL_COMMUNITY)
Admission: RE | Admit: 2014-01-16 | Discharge: 2014-01-16 | Disposition: A | Discharge status: Home / Self Care | Payer: Medicare Other | Source: Ambulatory Visit | Attending: Cardiovascular Disease | Admitting: Cardiovascular Disease

## 2014-01-16 DIAGNOSIS — E119 Type 2 diabetes mellitus without complications: Secondary | ICD-10-CM | POA: Insufficient documentation

## 2014-01-16 DIAGNOSIS — I739 Peripheral vascular disease, unspecified: Secondary | ICD-10-CM | POA: Diagnosis not present

## 2014-01-16 DIAGNOSIS — I10 Essential (primary) hypertension: Secondary | ICD-10-CM | POA: Diagnosis not present

## 2014-01-16 DIAGNOSIS — R079 Chest pain, unspecified: Secondary | ICD-10-CM | POA: Insufficient documentation

## 2014-01-16 DIAGNOSIS — F17200 Nicotine dependence, unspecified, uncomplicated: Secondary | ICD-10-CM | POA: Diagnosis not present

## 2014-01-16 DIAGNOSIS — E785 Hyperlipidemia, unspecified: Secondary | ICD-10-CM | POA: Insufficient documentation

## 2014-01-16 DIAGNOSIS — I25810 Atherosclerosis of coronary artery bypass graft(s) without angina pectoris: Secondary | ICD-10-CM

## 2014-01-16 MED ORDER — REGADENOSON 0.4 MG/5ML IV SOLN
0.4000 mg | Freq: Once | INTRAVENOUS | Status: AC
  Administered 2014-01-16: 0.4 mg via INTRAVENOUS

## 2014-01-16 MED ORDER — TECHNETIUM TC 99M SESTAMIBI GENERIC - CARDIOLITE
10.7000 | Freq: Once | INTRAVENOUS | Status: AC | PRN
  Administered 2014-01-16: 11 via INTRAVENOUS

## 2014-01-16 MED ORDER — TECHNETIUM TC 99M SESTAMIBI GENERIC - CARDIOLITE
31.6000 | Freq: Once | INTRAVENOUS | Status: AC | PRN
  Administered 2014-01-16: 32 via INTRAVENOUS

## 2014-01-16 NOTE — Procedures (Addendum)
[facility] [address] [place] [address] [phone]  Cardiology Nuclear Med Study  Cristian Hayden is a 73 y.o. male     MRN : [id number]     DOB: October 15, 1940  Procedure Date: 01/16/2014  Nuclear Med Background Indication for Stress Test:  Graft Patency History:  CAD, CABGx4 2013 Cardiac Risk Factors: Hypertension, Lipids, NIDDM, PVD and Smoker  Symptoms:  Chest Pain   Nuclear Pre-Procedure Caffeine/Decaff Intake:  7:00pm NPO After: 5:00am   IV Site: R Antecubital  IV 0.9% NS with Angio Cath:  22g  Chest Size (in):  42 IV Started by: Koren Shiver, CNMT  Height:  (1.676 m)  Cup Size: n/a  BMI:  Body mass index is 26.16 kg/(m^2). Weight:  162 lb (73.483 kg)   Tech Comments:      Nuclear Med Study 1 or 2 day study: 1 day  Stress Test Type:  Lexiscan  Order Authorizing Provider:  Rollene Rotunda, MD   Resting Radionuclide: Technetium 62m Sestamibi  Resting Radionuclide Dose: 10.7 mCi   Stress Radionuclide:  Technetium 51m Sestamibi  Stress Radionuclide Dose: 31.6 mCi           Stress Protocol Rest HR: 48 Stress HR: 63  Rest BP: 154/83 Stress BP: 147/52  Exercise Time (min): n/a METS: n/a   Predicted Max HR: 147 bpm % Max HR: 49.66 bpm Rate Pressure Product: 62952  Dose of Adenosine (mg):  n/a Dose of Lexiscan: 0.4 mg  Dose of Atropine (mg): n/a Dose of Dobutamine: n/a mcg/kg/min (at max HR)  Stress Test Technologist: Esperanza Sheets, CCT Nuclear Technologist: Gonzella Lex, CNMT   Rest Procedure:  Myocardial perfusion imaging was performed at rest 45 minutes following the intravenous administration of Technetium 76m Sestamibi. Stress Procedure:  The patient received IV Lexiscan 0.4 mg over 15-seconds.  Technetium 60m Sestamibi injected IV at 30-seconds.  There were no significant changes with Lexiscan.  Quantitative spect images were obtained after a 45 minute delay.  Transient Ischemic Dilatation  (Normal <1.22): 1.17 QGS EDV:  108 ml QGS ESV:  44 ml LV Ejection Fraction: 59%       Rest ECG: Sinus bradycardia  Stress ECG: No significant change from baseline ECG  QPS Raw Data Images:  Normal; no motion artifact; normal heart/lung ratio. Stress Images:  Normal homogeneous uptake in all areas of the myocardium. Rest Images:  Normal homogeneous uptake in all areas of the myocardium. Subtraction (SDS):  No evidence of ischemia.  Impression Exercise Capacity:  Lexiscan with no exercise. BP Response:  Normal blood pressure response. Clinical Symptoms:  No significant symptoms noted. ECG Impression:  No significant ST segment change suggestive of ischemia. Comparison with Prior Nuclear Study: No significant change from previous study  Overall Impression:  Normal stress nuclear study.  LV Wall Motion:  NL LV Function; NL Wall Motion   Runell Gess, MD  01/16/2014 12:26 PM

## 2014-03-02 ENCOUNTER — Other Ambulatory Visit: Payer: Self-pay

## 2014-03-02 ENCOUNTER — Telehealth: Payer: Self-pay | Admitting: Cardiology

## 2014-03-02 MED ORDER — ROSUVASTATIN CALCIUM 20 MG PO TABS: 20.0000 mg | ORAL_TABLET | Freq: Every day | ORAL | Status: DC

## 2014-03-02 NOTE — Telephone Encounter (Signed)
Patient needs refill on Crestor 20 mg # 30--1 daily.  [facility] [address] [facility] [address]

## 2014-05-17 ENCOUNTER — Ambulatory Visit (HOSPITAL_COMMUNITY)
Admission: RE | Admit: 2014-05-17 | Discharge: 2014-05-17 | Disposition: A | Discharge status: Home / Self Care | Payer: Medicare Other | Source: Ambulatory Visit | Attending: Internal Medicine | Admitting: Internal Medicine

## 2014-05-17 ENCOUNTER — Other Ambulatory Visit (HOSPITAL_COMMUNITY): Payer: Self-pay | Admitting: Ophthalmology

## 2014-05-17 DIAGNOSIS — I6521 Occlusion and stenosis of right carotid artery: Secondary | ICD-10-CM | POA: Diagnosis present

## 2014-05-17 DIAGNOSIS — H3411 Central retinal artery occlusion, right eye: Secondary | ICD-10-CM

## 2014-05-17 LAB — CBC WITH DIFFERENTIAL/PLATELET
Basophils Absolute: 0 10*3/uL (ref 0.0–0.1)
Basophils Relative: 0 % (ref 0–1)
Eosinophils Absolute: 0.1 10*3/uL (ref 0.0–0.7)
Eosinophils Relative: 2 % (ref 0–5)
HEMATOCRIT: 38.5 % — AB (ref 39.0–52.0)
HEMOGLOBIN: 13.1 g/dL (ref 13.0–17.0)
LYMPHS PCT: 44 % (ref 12–46)
Lymphs Abs: 2.4 10*3/uL (ref 0.7–4.0)
MCH: 29.4 pg (ref 26.0–34.0)
MCHC: 34 g/dL (ref 30.0–36.0)
MCV: 86.5 fL (ref 78.0–100.0)
MONO ABS: 0.4 10*3/uL (ref 0.1–1.0)
Monocytes Relative: 7 % (ref 3–12)
Neutro Abs: 2.6 10*3/uL (ref 1.7–7.7)
Neutrophils Relative %: 47 % (ref 43–77)
PLATELETS: 190 10*3/uL (ref 150–400)
RBC: 4.45 MIL/uL (ref 4.22–5.81)
RDW: 14.6 % (ref 11.5–15.5)
WBC: 5.5 10*3/uL (ref 4.0–10.5)

## 2014-05-17 LAB — SEDIMENTATION RATE: SED RATE: 36 mm/h — AB (ref 0–16)

## 2014-05-17 LAB — C-REACTIVE PROTEIN: CRP: <0.5 mg/dL — ABNORMAL LOW (ref <0.60)

## 2014-05-17 NOTE — Progress Notes (Signed)
VASCULAR LAB PRELIMINARY  PRELIMINARY  PRELIMINARY  PRELIMINARY  Carotid duplex completed.    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  Vertebra; artery flow is antegrade. Left : 1% to 39% ICA stenosis. Vertebral artery flow is antegrade.  Cristian Hayden, RVS 05/17/2014, 4:45 PM

## 2014-05-21 DIAGNOSIS — H3411 Central retinal artery occlusion, right eye: Secondary | ICD-10-CM | POA: Insufficient documentation

## 2014-06-04 ENCOUNTER — Emergency Department (HOSPITAL_COMMUNITY)
Admission: EM | Admit: 2014-06-04 | Discharge: 2014-06-04 | Disposition: A | Discharge status: Home / Self Care | Payer: Medicare Other | Attending: Emergency Medicine | Admitting: Emergency Medicine

## 2014-06-04 ENCOUNTER — Emergency Department (HOSPITAL_COMMUNITY): Payer: Medicare Other

## 2014-06-04 ENCOUNTER — Encounter (HOSPITAL_COMMUNITY): Payer: Self-pay | Admitting: Emergency Medicine

## 2014-06-04 DIAGNOSIS — Z951 Presence of aortocoronary bypass graft: Secondary | ICD-10-CM | POA: Diagnosis not present

## 2014-06-04 DIAGNOSIS — H578 Other specified disorders of eye and adnexa: Secondary | ICD-10-CM | POA: Diagnosis not present

## 2014-06-04 DIAGNOSIS — E785 Hyperlipidemia, unspecified: Secondary | ICD-10-CM | POA: Diagnosis not present

## 2014-06-04 DIAGNOSIS — Z79899 Other long term (current) drug therapy: Secondary | ICD-10-CM | POA: Insufficient documentation

## 2014-06-04 DIAGNOSIS — Z720 Tobacco use: Secondary | ICD-10-CM | POA: Diagnosis not present

## 2014-06-04 DIAGNOSIS — I2510 Atherosclerotic heart disease of native coronary artery without angina pectoris: Secondary | ICD-10-CM | POA: Diagnosis not present

## 2014-06-04 DIAGNOSIS — K5900 Constipation, unspecified: Secondary | ICD-10-CM | POA: Diagnosis not present

## 2014-06-04 DIAGNOSIS — Z7982 Long term (current) use of aspirin: Secondary | ICD-10-CM | POA: Insufficient documentation

## 2014-06-04 DIAGNOSIS — R109 Unspecified abdominal pain: Secondary | ICD-10-CM | POA: Insufficient documentation

## 2014-06-04 DIAGNOSIS — M1990 Unspecified osteoarthritis, unspecified site: Secondary | ICD-10-CM | POA: Diagnosis not present

## 2014-06-04 DIAGNOSIS — I10 Essential (primary) hypertension: Secondary | ICD-10-CM | POA: Insufficient documentation

## 2014-06-04 DIAGNOSIS — Z8709 Personal history of other diseases of the respiratory system: Secondary | ICD-10-CM | POA: Diagnosis not present

## 2014-06-04 LAB — CBC WITH DIFFERENTIAL/PLATELET
Basophils Absolute: 0 10*3/uL (ref 0.0–0.1)
Basophils Relative: 0 % (ref 0–1)
EOS PCT: 0 % (ref 0–5)
Eosinophils Absolute: 0 10*3/uL (ref 0.0–0.7)
HCT: 39.4 % (ref 39.0–52.0)
HEMOGLOBIN: 13.6 g/dL (ref 13.0–17.0)
LYMPHS ABS: 1.6 10*3/uL (ref 0.7–4.0)
Lymphocytes Relative: 11 % — ABNORMAL LOW (ref 12–46)
MCH: 30 pg (ref 26.0–34.0)
MCHC: 34.5 g/dL (ref 30.0–36.0)
MCV: 87 fL (ref 78.0–100.0)
MONO ABS: 1.3 10*3/uL — AB (ref 0.1–1.0)
Monocytes Relative: 9 % (ref 3–12)
NEUTROS PCT: 80 % — AB (ref 43–77)
Neutro Abs: 12.5 10*3/uL — ABNORMAL HIGH (ref 1.7–7.7)
Platelets: 210 10*3/uL (ref 150–400)
RBC: 4.53 MIL/uL (ref 4.22–5.81)
RDW: 15.2 % (ref 11.5–15.5)
WBC: 15.4 10*3/uL — AB (ref 4.0–10.5)

## 2014-06-04 LAB — COMPREHENSIVE METABOLIC PANEL
ALK PHOS: 60 U/L (ref 39–117)
ALT: 41 U/L (ref 0–53)
AST: 16 U/L (ref 0–37)
Albumin: 3.1 g/dL — ABNORMAL LOW (ref 3.5–5.2)
Anion gap: 6 (ref 5–15)
BUN: 16 mg/dL (ref 6–23)
CALCIUM: 8.8 mg/dL (ref 8.4–10.5)
CHLORIDE: 105 mmol/L (ref 96–112)
CO2: 29 mmol/L (ref 19–32)
Creatinine, Ser: 1.2 mg/dL (ref 0.50–1.35)
GFR calc Af Amer: 67 mL/min — ABNORMAL LOW (ref >90)
GFR calc non Af Amer: 58 mL/min — ABNORMAL LOW (ref >90)
GLUCOSE: 128 mg/dL — AB (ref 70–99)
Potassium: 3.6 mmol/L (ref 3.5–5.1)
SODIUM: 140 mmol/L (ref 135–145)
Total Bilirubin: 0.4 mg/dL (ref 0.3–1.2)
Total Protein: 5.9 g/dL — ABNORMAL LOW (ref 6.0–8.3)

## 2014-06-04 LAB — I-STAT CG4 LACTIC ACID, ED: Lactic Acid, Venous: 1.19 mmol/L (ref 0.5–2.0)

## 2014-06-04 LAB — URINALYSIS, ROUTINE W REFLEX MICROSCOPIC
Bilirubin Urine: NEGATIVE
Glucose, UA: NEGATIVE mg/dL
HGB URINE DIPSTICK: NEGATIVE
KETONES UR: NEGATIVE mg/dL
Leukocytes, UA: NEGATIVE
Nitrite: NEGATIVE
Protein, ur: NEGATIVE mg/dL
Specific Gravity, Urine: 1.011 (ref 1.005–1.030)
Urobilinogen, UA: 0.2 mg/dL (ref 0.0–1.0)
pH: 6.5 (ref 5.0–8.0)

## 2014-06-04 NOTE — ED Notes (Signed)
Patient transported to X-ray 

## 2014-06-04 NOTE — ED Notes (Signed)
Pt comfortable with discharge and follow up instructions. No prescriptions. 

## 2014-06-04 NOTE — ED Notes (Signed)
Pt stated " I wear my glasses all the time"

## 2014-06-04 NOTE — ED Notes (Signed)
GrenadaBrittany, RN aware of visual acuity results

## 2014-06-04 NOTE — ED Notes (Signed)
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

## 2014-06-04 NOTE — Discharge Instructions (Signed)

## 2014-06-04 NOTE — ED Notes (Signed)
Pt reports abdominal pain x couple months and right eye vision problems couple of weeks. Pt has been seen by PMD for same.

## 2014-06-04 NOTE — ED Provider Notes (Signed)
CSN: [id number]     Arrival date & time 06/04/14  [id number] History   First MD Initiated Contact with Patient 06/04/14 [phone]     Chief Complaint  Patient presents with  . Abdominal Pain  . Eye Problem     (Consider location/radiation/quality/duration/timing/severity/associated sxs/prior Treatment) Patient is a 74 y.o. male presenting with abdominal pain and eye problem. The history is provided by the patient.  Abdominal Pain Associated symptoms: constipation   Associated symptoms: no fever and no shortness of breath   Eye Problem Associated symptoms: no headaches   patient presents with constipation for the last few months. States that he has some difficulty with urination for the last few months. Also has some constipation. Has seen his urologist and started on a medicine. Also states he feels he has to have bowel movements and just small amounts come out. No chest pain. No fevers. No weight loss. His appetite's been good. Patient also states he lost vision in his right eye a few weeks ago. He is seen an ophthalmologist, Dr. Allena KatzPatel and was started on steroids. States he was told that he had a small stroke. 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 This is been unchanged recently.  Past Medical History  Diagnosis Date  . Arthritis   . Hypertension   . Bronchitis   . Peripheral vascular disease     with claludication  . Hyperlipidemia     and Dyslipidemia  . CAD (coronary artery disease)   . PVD (peripheral vascular disease)    Past Surgical History  Procedure Laterality Date  . Femoral-femoral bpga  05/29/1999    Left profundoplasty  and Left Fem/Pop 05/28/2008  . Coronary artery bypass graft      LIMA to LAD, SVG to PDA, SVG to OM1/OM2  . Cataract extraction  June 2013    Cataract Right eye   Family History  Problem Relation Age of Onset  . Hypertension Mother   . Heart disease Father 5588    CAD  . Hypertension Father   . Hypertension Sister   . Hypertension Brother     History  Substance Use Topics  . Smoking status: Current Some Day Smoker -- 0.20 packs/day    Types: Cigarettes  . Smokeless tobacco: Never Used     Comment: pt states he is trying, only smokes about 5 per day  . Alcohol Use: No     Comment: former    Review of Systems  Constitutional: Negative for fever and appetite change.  HENT: Negative for facial swelling.   Eyes: Positive for visual disturbance.  Respiratory: Negative for chest tightness and shortness of breath.   Gastrointestinal: Positive for abdominal pain and constipation.  Genitourinary: Positive for difficulty urinating. Negative for penile pain.  Musculoskeletal: Negative for back pain.  Skin: Negative for rash.  Neurological: Negative for seizures and headaches.      Allergies  Review of patient's allergies indicates no known allergies.  Home Medications   Prior to Admission medications   Medication Sig Start Date End Date Taking? Authorizing Provider  amLODipine (NORVASC) 10 MG tablet Take 1 tablet (10 mg total) by mouth daily. 09/12/12   Shanker Levora DredgeM Ghimire, MD  aspirin 325 MG tablet Take 325 mg by mouth daily.    Historical Provider, MD  carvedilol (COREG) 12.5 MG tablet Take 1 tablet (12.5 mg total) by mouth 2 (two) times daily with a meal. 09/12/12   Maretta BeesShanker M Ghimire, MD  dorzolamide-timolol (COSOPT) 22.3-6.8 MG/ML ophthalmic  solution Place 1 drop into both eyes 2 (two) times daily.  07/22/11   Historical Provider, MD  latanoprost (XALATAN) 0.005 % ophthalmic solution Place 1 drop into both eyes Daily.  08/06/11   Historical Provider, MD  lisinopril (PRINIVIL,ZESTRIL) 20 MG tablet Take 1 tablet (20 mg total) by mouth daily. 09/12/12   Shanker Levora Dredge, MD  pentoxifylline (TRENTAL) 400 MG CR tablet Take 1 tablet by mouth daily. 12/05/13   Historical Provider, MD  rosuvastatin (CRESTOR) 20 MG tablet Take 1 tablet (20 mg total) by mouth daily. 03/02/14   Rollene Rotunda, MD  tamsulosin (FLOMAX) 0.4 MG CAPS capsule  Take 0.4 mg by mouth at bedtime.    Historical Provider, MD   BP 142/64 mmHg  Pulse 53  Temp(Src) 98.6 F (37 C) (Oral)  Resp 18  Ht  (1.676 m)  Wt 169 lb (76.658 kg)  BMI 27.29 kg/m2  SpO2 99% Physical Exam  Constitutional: He is oriented to person, place, and time. He appears well-developed and well-nourished.  HENT:  Head: Normocephalic and atraumatic.  Eyes: EOM are normal.  Decreased vision in his right eye. Less than counting fingers.  Neck: Normal range of motion. Neck supple.  Cardiovascular: Normal rate, regular rhythm and normal heart sounds.   Pulmonary/Chest: Effort normal and breath sounds normal.  Abdominal: Soft. He exhibits no distension and no mass. There is no tenderness. There is no guarding.  Musculoskeletal: Normal range of motion. He exhibits no edema.  Neurological: He is alert and oriented to person, place, and time.  Skin: Skin is warm and dry.  Psychiatric: He has a normal mood and affect.  Nursing note and vitals reviewed.   ED Course  Procedures (including critical care time) Labs Review Labs Reviewed  COMPREHENSIVE METABOLIC PANEL  CBC WITH DIFFERENTIAL/PLATELET    Imaging Review No results found.   EKG Interpretation None      MDM   Final diagnoses:  Abdominal pain    Patient with vague abdominal complaints. Also some urinary complaints. Lab work reassuring. Rather benign abdominal exam. Rectal exam did not show mass. 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 Will discharge home. Patient will follow-up with GI as needed.    Juliet Rude. Rubin Payor, MD 06/04/14 [phone]

## 2014-06-20 ENCOUNTER — Ambulatory Visit: Payer: Medicare Other | Admitting: Vascular Surgery

## 2014-06-20 ENCOUNTER — Encounter (HOSPITAL_COMMUNITY): Payer: Medicare Other

## 2014-07-05 ENCOUNTER — Encounter: Payer: Self-pay | Admitting: Vascular Surgery

## 2014-07-06 ENCOUNTER — Ambulatory Visit (HOSPITAL_COMMUNITY)
Admission: RE | Admit: 2014-07-06 | Discharge: 2014-07-06 | Disposition: A | Discharge status: Home / Self Care | Payer: Medicare Other | Source: Ambulatory Visit | Attending: Vascular Surgery | Admitting: Vascular Surgery

## 2014-07-06 ENCOUNTER — Ambulatory Visit (INDEPENDENT_AMBULATORY_CARE_PROVIDER_SITE_OTHER): Payer: Medicare Other | Admitting: Vascular Surgery

## 2014-07-06 ENCOUNTER — Encounter: Payer: Self-pay | Admitting: Vascular Surgery

## 2014-07-06 VITALS — BP 105/65 | HR 51 | Resp 16 | Ht 64.0 in | Wt 157.0 lb

## 2014-07-06 DIAGNOSIS — M5441 Lumbago with sciatica, right side: Secondary | ICD-10-CM

## 2014-07-06 DIAGNOSIS — I739 Peripheral vascular disease, unspecified: Secondary | ICD-10-CM

## 2014-07-06 DIAGNOSIS — M5442 Lumbago with sciatica, left side: Secondary | ICD-10-CM

## 2014-07-06 DIAGNOSIS — K30 Functional dyspepsia: Secondary | ICD-10-CM | POA: Insufficient documentation

## 2014-07-06 DIAGNOSIS — I70209 Unspecified atherosclerosis of native arteries of extremities, unspecified extremity: Secondary | ICD-10-CM | POA: Diagnosis not present

## 2014-07-06 DIAGNOSIS — Z48812 Encounter for surgical aftercare following surgery on the circulatory system: Secondary | ICD-10-CM

## 2014-07-06 DIAGNOSIS — M545 Low back pain, unspecified: Secondary | ICD-10-CM | POA: Insufficient documentation

## 2014-07-06 DIAGNOSIS — R109 Unspecified abdominal pain: Secondary | ICD-10-CM | POA: Insufficient documentation

## 2014-07-06 NOTE — Progress Notes (Signed)
[facility]  Patient name: Cristian Hayden MRN: [id number] DOB: 1940-08-30 Sex: male  REASON FOR VISIT: Follow up of peripheral vascular disease  HPI: Cristian Hayden is a 74 y.o. male who I last saw on 06/14/2013. He has undergone previous right to left femorofemoral bypass graft by Dr. Madilyn Fireman in 2010. The graft is chronically occluded. I have been following him with bilateral lower extremity claudication. The time of his last visit he was still smoking and we discussed the importance of tobacco cessation. He comes in for a 1 year follow up visit.  He continues to have bilateral calf claudication which is equal on both sides. This occurs approximately one block. His symptoms have been stable. I do not get any history of rest pain. He has no history of nonhealing ulcers.  He does continue to smoke, a proximally one third pack per day.  Past Medical History  Diagnosis Date  . Arthritis   . Hypertension   . Bronchitis   . Peripheral vascular disease     with claludication  . Hyperlipidemia     and Dyslipidemia  . CAD (coronary artery disease)   . PVD (peripheral vascular disease)    Family History  Problem Relation Age of Onset  . Hypertension Mother   . Heart disease Father 4    CAD  . Hypertension Father   . Hypertension Sister   . Hypertension Brother    SOCIAL HISTORY: History  Substance Use Topics  . Smoking status: Current Some Day Smoker -- 0.20 packs/day    Types: Cigarettes  . Smokeless tobacco: Never Used     Comment: pt states he is trying, only smokes about 5 per day  . Alcohol Use: No     Comment: former   No Known Allergies Current Outpatient Prescriptions  Medication Sig Dispense Refill  . amLODipine (NORVASC) 10 MG tablet Take 1 tablet (10 mg total) by mouth daily. 30 tablet 3  . aspirin 325 MG tablet Take 325 mg by mouth daily.    . bisacodyl (DULCOLAX) 5 MG EC tablet Take 5 mg by mouth daily as needed for moderate  constipation.    . carvedilol (COREG) 12.5 MG tablet Take 1 tablet (12.5 mg total) by mouth 2 (two) times daily with a meal. 60 tablet 3  . dorzolamide-timolol (COSOPT) 22.3-6.8 MG/ML ophthalmic solution Place 1 drop into both eyes 2 (two) times daily.     Marland Kitchen latanoprost (XALATAN) 0.005 % ophthalmic solution Place 1 drop into both eyes Daily.     Marland Kitchen lisinopril (PRINIVIL,ZESTRIL) 20 MG tablet Take 1 tablet (20 mg total) by mouth daily. 30 tablet 3  . pentoxifylline (TRENTAL) 400 MG CR tablet Take 1 tablet by mouth 2 (two) times daily before a meal.     . predniSONE (DELTASONE) 10 MG tablet Take 30 mg by mouth daily with breakfast. Take 3 tablets po bid x3 weeks, then 2 tablets bid x2 weeks, then 1 tablet bid x2 weeks. Pt will be taking 3 tablets bid till 06/14/14.    Marland Kitchen rosuvastatin (CRESTOR) 20 MG tablet Take 1 tablet (20 mg total) by mouth daily. 30 tablet 11  . tamsulosin (FLOMAX) 0.4 MG CAPS capsule Take 0.4 mg by mouth at bedtime.    . [DISCONTINUED] atorvastatin (LIPITOR) 80 MG tablet Take 80 mg by mouth daily.    . [DISCONTINUED] olmesartan-hydrochlorothiazide (BENICAR HCT) 40-12.5 MG per tablet Take 1 tablet by mouth daily.     No current  facility-administered medications for this visit.   REVIEW OF SYSTEMS: Arly.Keller[X ] denotes positive finding; [  ] denotes negative finding  CARDIOVASCULAR:  Arly.Keller[X ] chest pain   [ ]  chest pressure   [ ]  palpitations   [ ]  orthopnea   [ ]  dyspnea on exertion   Arly.Keller[X ] claudication   [ ]  rest pain   [ ]  DVT   [ ]  phlebitis PULMONARY:   Arly.Keller[X ] productive cough   [ ]  asthma   [ ]  wheezing NEUROLOGIC:   Arly.Keller[X ] weakness  [ ]  paresthesias  [ ]  aphasia  [ ]  amaurosis  [ ]  dizziness HEMATOLOGIC:   [ ]  bleeding problems   [ ]  clotting disorders MUSCULOSKELETAL:  [ ]  joint pain   [ ]  joint swelling Arly.Keller[X ] leg swelling GASTROINTESTINAL: [ ]   blood in stool  [ ]   hematemesis GENITOURINARY:  [ ]   dysuria  [ ]   hematuria PSYCHIATRIC:  [ ]  history of major depression INTEGUMENTARY:  [ ]   rashes  [ ]  ulcers CONSTITUTIONAL:  [ ]  fever   [ ]  chills  PHYSICAL EXAM: Filed Vitals:   07/06/14 1134  BP: 105/65  Pulse: 51  Resp: 16  Height: 5\' 4"  (1.626 m)  Weight: 157 lb (71.215 kg)  SpO2: 100%   Body mass index is 26.94 kg/(m^2). GENERAL: The patient is a well-nourished male, in no acute distress. The vital signs are documented above. CARDIOVASCULAR: There is a regular rate and rhythm. I do not detect carotid bruits. He has a diminished but palpable right femoral pulse. I cannot palpate a left femoral pulse. I cannot palpate pedal pulses. Both feet are warm and well-perfused. PULMONARY: There is good air exchange bilaterally without wheezing or rales. ABDOMEN: Soft and non-tender with normal pitched bowel sounds.  MUSCULOSKELETAL: There are no major deformities or cyanosis. NEUROLOGIC: No focal weakness or paresthesias are detected. SKIN: There are no ulcers or rashes noted. PSYCHIATRIC: The patient has a normal affect.  DATA:  I have independently interpreted his arterial Doppler study today which shows monophasic Doppler signals in the dorsalis pedis and posterior tibial positions bilaterally. ABI on the right is 77%. Toe pressure on the right is 79 mmHg. ABI on the left is 66%. Toe pressure on the left is 55 mmHg. The ABI on the right has improved. The ABI on the left is also improved.  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 Madilyn FiremanHayes. His symptoms however remains stable. We have again discussed the importance of tobacco cessation. I am encouraged him to stay as active as possible.  I have ordered follow up ABIs and we'll have him seen by the nurse practitioner in one year. He knows to call sooner if he has problems.   Return in about 1 year (around 07/06/2015).   DICKSON,CHRISTOPHER S [facility]: [phone]

## 2014-07-09 ENCOUNTER — Other Ambulatory Visit: Payer: Self-pay | Admitting: *Deleted

## 2014-07-09 DIAGNOSIS — I739 Peripheral vascular disease, unspecified: Secondary | ICD-10-CM

## 2014-07-11 ENCOUNTER — Encounter (HOSPITAL_COMMUNITY): Payer: Medicare Other

## 2014-07-11 ENCOUNTER — Ambulatory Visit: Payer: Medicare Other | Admitting: Vascular Surgery

## 2015-03-07 ENCOUNTER — Ambulatory Visit (INDEPENDENT_AMBULATORY_CARE_PROVIDER_SITE_OTHER): Payer: Medicare Other | Admitting: Cardiology

## 2015-03-07 ENCOUNTER — Encounter: Payer: Self-pay | Admitting: Cardiology

## 2015-03-07 VITALS — BP 100/62 | HR 47 | Ht 64.0 in | Wt 164.4 lb

## 2015-03-07 DIAGNOSIS — H3411 Central retinal artery occlusion, right eye: Secondary | ICD-10-CM | POA: Diagnosis not present

## 2015-03-07 DIAGNOSIS — I2510 Atherosclerotic heart disease of native coronary artery without angina pectoris: Secondary | ICD-10-CM

## 2015-03-07 NOTE — Patient Instructions (Signed)
Your physician wants you to follow-up in: 1 Year. You will receive a reminder letter in the mail two months in advance. If you don't receive a letter, please call our office to schedule the follow-up appointment.  Your physician has recommended you make the following change in your medication: Decrease Aspirin 81 mg daily   

## 2015-03-07 NOTE — Progress Notes (Signed)
HPI The patient presents for follow up of coronary artery disease with CABG in 2003.  He also has peripheral vascular disease status post lower extremity bypass.  In Sept 2015 he did have a stress perfusion study that was negative for any evidence of ischemia.    Since I last saw him he's had no new symptoms. He gets some sporadic chest discomfort that he thinks is unchanged from previous. He's not very active being limited by leg pain. However, with his usual activities he is not necessarily bringing on any new symptoms. He does have any presyncope or syncope. He's not noticing any palpitations. He has no new shortness of breath, PND or orthopnea.   No Known Allergies  Current Outpatient Prescriptions  Medication Sig Dispense Refill  . amLODipine (NORVASC) 10 MG tablet Take 1 tablet (10 mg total) by mouth daily. 30 tablet 3  . aspirin 325 MG tablet Take 325 mg by mouth daily.    . bisacodyl (DULCOLAX) 5 MG EC tablet Take 5 mg by mouth daily as needed for moderate constipation.    . carvedilol (COREG) 12.5 MG tablet Take 1 tablet (12.5 mg total) by mouth 2 (two) times daily with a meal. 60 tablet 3  . dicyclomine (BENTYL) 10 MG capsule Take 1 capsule by mouth 3 (three) times daily. For 30 days  0  . dorzolamide-timolol (COSOPT) 22.3-6.8 MG/ML ophthalmic solution Place 1 drop into both eyes 2 (two) times daily.     Marland Kitchen. latanoprost (XALATAN) 0.005 % ophthalmic solution Place 1 drop into both eyes Daily.     Marland Kitchen. lisinopril (PRINIVIL,ZESTRIL) 20 MG tablet Take 1 tablet (20 mg total) by mouth daily. 30 tablet 3  . rosuvastatin (CRESTOR) 20 MG tablet Take 1 tablet (20 mg total) by mouth daily. 30 tablet 11  . tamsulosin (FLOMAX) 0.4 MG CAPS capsule Take 0.4 mg by mouth at bedtime.    . [DISCONTINUED] atorvastatin (LIPITOR) 80 MG tablet Take 80 mg by mouth daily.    . [DISCONTINUED] olmesartan-hydrochlorothiazide (BENICAR HCT) 40-12.5 MG per tablet Take 1 tablet by mouth daily.     No current  facility-administered medications for this visit.    Past Medical History  Diagnosis Date  . Arthritis   . Hypertension   . Bronchitis   . Peripheral vascular disease (HCC)     with claludication  . Hyperlipidemia     and Dyslipidemia  . CAD (coronary artery disease)   . PVD (peripheral vascular disease) [facility])     Past Surgical History  Procedure Laterality Date  . Femoral-femoral bpga  05/29/1999    Left profundoplasty  and Left Fem/Pop 05/28/2008  . Coronary artery bypass graft      LIMA to LAD, SVG to PDA, SVG to OM1/OM2  . Cataract extraction  June 2013    Cataract Right eye     ROS: Positive for constipation. Otherwise as stated in the HPI and negative for all other systems.  PHYSICAL EXAM BP 100/62 mmHg  Pulse 47  Ht 5\' 4"  (1.626 m)  Wt 164 lb 6.4 oz (74.571 kg)  BMI 28.21 kg/m2 GENERAL:  Well appearing NECK:  No jugular venous distention, waveform within normal limits, carotid upstroke brisk and symmetric, no bruits, no thyromegaly LYMPHATICS:  No cervical, inguinal adenopathy LUNGS:  Clear to auscultation bilaterally BACK:  No CVA tenderness CHEST:  Well healed sternotomy scar. HEART:  PMI not displaced or sustained,S1 and S2 within normal limits, no S3, no S4, no clicks, no rubs, no  murmurs ABD:  Flat, positive bowel sounds normal in frequency in pitch, no bruits, no rebound, no guarding, no midline pulsatile mass, no hepatomegaly, no splenomegaly EXT:  2 plus pulses upper, diminished DP/PT bilaterally, no edema, no cyanosis no clubbing, left femoral bruit   EKG:  Sinus bradycardia, rate 47, axis within normal limits, intervals within normal limits, no acute ST-T wave changes.  03/07/2015   ASSESSMENT AND PLAN  CAD - The patient has no new sypmtoms.  No further cardiovascular testing is indicated.  We will continue with aggressive risk reduction and meds as listed.  He can reduce his ASA to 81 mg daily.   Tobacco abuse - I discussed with him today again the  need to stop smoking.  He is down to a pack every 3 days.    HTN - The blood pressure is at target. No change in medications is indicated. We will continue with therapeutic lifestyle changes (TLC).  HYPERLIPIDEMIA - I will try to get a copy of her most recent lipid profile. For now he will remain on the meds as listed.  BRADYCARDIA - This is not causing symptoms.  No change in therapy is indicated.

## 2015-07-05 ENCOUNTER — Encounter: Payer: Self-pay | Admitting: Family

## 2015-07-10 ENCOUNTER — Encounter: Payer: Self-pay | Admitting: Family

## 2015-07-10 ENCOUNTER — Ambulatory Visit (HOSPITAL_COMMUNITY)
Admission: RE | Admit: 2015-07-10 | Discharge: 2015-07-10 | Disposition: A | Discharge status: Home / Self Care | Payer: Medicare Other | Source: Ambulatory Visit | Attending: Family | Admitting: Family

## 2015-07-10 ENCOUNTER — Ambulatory Visit (INDEPENDENT_AMBULATORY_CARE_PROVIDER_SITE_OTHER): Payer: Medicare Other | Admitting: Family

## 2015-07-10 VITALS — BP 109/60 | HR 43 | Temp 97.5°F | Ht 64.0 in | Wt 163.0 lb

## 2015-07-10 DIAGNOSIS — Z720 Tobacco use: Secondary | ICD-10-CM

## 2015-07-10 DIAGNOSIS — Z4889 Encounter for other specified surgical aftercare: Secondary | ICD-10-CM | POA: Diagnosis not present

## 2015-07-10 DIAGNOSIS — I739 Peripheral vascular disease, unspecified: Secondary | ICD-10-CM | POA: Insufficient documentation

## 2015-07-10 DIAGNOSIS — Z48812 Encounter for surgical aftercare following surgery on the circulatory system: Secondary | ICD-10-CM

## 2015-07-10 DIAGNOSIS — R938 Abnormal findings on diagnostic imaging of other specified body structures: Secondary | ICD-10-CM | POA: Diagnosis not present

## 2015-07-10 DIAGNOSIS — E785 Hyperlipidemia, unspecified: Secondary | ICD-10-CM | POA: Insufficient documentation

## 2015-07-10 DIAGNOSIS — I10 Essential (primary) hypertension: Secondary | ICD-10-CM | POA: Diagnosis not present

## 2015-07-10 DIAGNOSIS — F17200 Nicotine dependence, unspecified, uncomplicated: Secondary | ICD-10-CM

## 2015-07-10 DIAGNOSIS — I70209 Unspecified atherosclerosis of native arteries of extremities, unspecified extremity: Secondary | ICD-10-CM

## 2015-07-10 NOTE — Patient Instructions (Signed)

## 2015-07-10 NOTE — Progress Notes (Signed)
[facility] [place] HISTORY AND PHYSICAL -PAD  History of Present Illness Cristian Hayden is a 75 y.o. male patient of Dr. Edilia Bo who is s/p right to left femorofemoral bypass graft by Dr. Madilyn Fireman in 2010. The graft is chronically occluded. Dr. Edilia Bo has been following him with bilateral lower extremity claudication.   Pt states he has told his PCP about the chest pain he has been having. Pt states he saw his cardiologist in 2016, states "everything is good".   He walks about 1 block and both calves hurt equally, relieved with rest, this remains stable. He denies non healing wounds.   He had a right ocular stroke in 2016.  Pt Diabetic: No Pt smoker: smoker  (2-4 cigarettes/day, started at age 89 yrs)  Pt meds include: Statin :Yes Betablocker: Yes ASA: Yes Other anticoagulants/antiplatelets: no; He takes Trental    Past Medical History  Diagnosis Date  . Arthritis   . Hypertension   . Bronchitis   . Peripheral vascular disease (HCC)     with claludication  . Hyperlipidemia     and Dyslipidemia  . CAD (coronary artery disease)   . PVD (peripheral vascular disease) (HCC)     Social History Social History  Substance Use Topics  . Smoking status: Current Some Day Smoker -- 0.20 packs/day    Types: Cigarettes  . Smokeless tobacco: Never Used     Comment: pt states he is trying, only smokes about 5 per day  . Alcohol Use: No     Comment: former    Family History Family History  Problem Relation Age of Onset  . Hypertension Mother   . Heart disease Father 43    CAD  . Hypertension Father   . Hypertension Sister   . Hypertension Brother     Past Surgical History  Procedure Laterality Date  . Femoral-femoral bpga  05/29/1999    Left profundoplasty  and Left Fem/Pop 05/28/2008  . Coronary artery bypass graft      LIMA to LAD, SVG to PDA, SVG to OM1/OM2  . Cataract extraction  June 2013    Cataract Right eye    No Known Allergies  Current  Outpatient Prescriptions  Medication Sig Dispense Refill  . amLODipine (NORVASC) 10 MG tablet Take 1 tablet (10 mg total) by mouth daily. 30 tablet 3  . aspirin 81 MG tablet Take 81 mg by mouth daily.    . bisacodyl (DULCOLAX) 5 MG EC tablet Take 5 mg by mouth daily as needed for moderate constipation.    . carvedilol (COREG) 12.5 MG tablet Take 1 tablet (12.5 mg total) by mouth 2 (two) times daily with a meal. 60 tablet 3  . dicyclomine (BENTYL) 10 MG capsule Take 1 capsule by mouth 3 (three) times daily. For 30 days  0  . dorzolamide-timolol (COSOPT) 22.3-6.8 MG/ML ophthalmic solution Place 1 drop into both eyes 2 (two) times daily.     Marland Kitchen latanoprost (XALATAN) 0.005 % ophthalmic solution Place 1 drop into both eyes Daily.     Marland Kitchen lisinopril (PRINIVIL,ZESTRIL) 20 MG tablet Take 1 tablet (20 mg total) by mouth daily. 30 tablet 3  . rosuvastatin (CRESTOR) 20 MG tablet Take 1 tablet (20 mg total) by mouth daily. 30 tablet 11  . tamsulosin (FLOMAX) 0.4 MG CAPS capsule Take 0.4 mg by mouth at bedtime.    . [DISCONTINUED] atorvastatin (LIPITOR) 80 MG tablet Take 80 mg by mouth daily.    . [DISCONTINUED] olmesartan-hydrochlorothiazide [facility]  HCT) 40-12.5 MG per tablet Take 1 tablet by mouth daily.     No current facility-administered medications for this visit.    ROS: See HPI for pertinent positives and negatives.   Physical Examination  Filed Vitals:   07/10/15 1043  BP: 109/60  Pulse: 43  Temp: 97.5 F (36.4 C)  TempSrc: Oral  Height: 5\' 4"  (1.626 m)  Weight: 163 lb (73.936 kg)  SpO2: 95%   Body mass index is 27.97 kg/(m^2).  General: A&O x 3, WDWN. Gait: normal Eyes: PERRLA. Pulmonary: CTAB, without wheezes , rales or rhonchi. Cardiac: regular rhythm, no detected murmur.         Carotid Bruits Right Left   Negative Negative  Aorta is not palpable. Radial pulses: 1+ palpable and =                           VASCULAR EXAM: Extremities without ischemic changes, without  Gangrene; without open wounds. Fem-fem bypass graft is palpable, but no pulse is palpable                                                                                                          LE Pulses Right Left       FEMORAL  not palpable  not palpable        POPLITEAL  not palpable   not palpable       POSTERIOR TIBIAL  not palpable   not palpable        DORSALIS PEDIS      ANTERIOR TIBIAL not palpable  not palpable    Abdomen: soft, NT, no palpable masses. Skin: no rashes, no ulcers. Musculoskeletal: no muscle wasting or atrophy.  Neurologic: A&O X 3; Appropriate Affect ; SENSATION: normal; MOTOR FUNCTION:  moving all extremities equally, motor strength 5/5 throughout. Speech is fluent/normal.  CN 2-12 intact.    Non-Invasive Vascular Imaging: DATE: 07/10/2015 ABI: RIGHT: 0.62 (0.54), Waveforms: monophasic, TBI: 0.51;  LEFT: 0.52 (0.48), Waveforms: monophasic, TBI: 0.36   ASSESSMENT: Cristian Hayden is a 75 y.o. male who is s/p right to left femorofemoral bypass graft by Dr. Madilyn FiremanHayes in 2010.  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 His claudication symptoms however remain stable. He has no signs of ischemia in his feet/legs. ABI's remain stable with moderate arterial occlusive disease in the right leg and severe in the left leg.  PLAN:  The patient was counseled re smoking cessation and given several free resources re smoking cessation.   Graduated walking program discussed in detail.  Based on the patient's vascular studies and examination, pt will return to clinic in 1 year with ABI's. I advised pt to notify us if he develops concerns re the circulation in his legs.   I discussed in depth with the patient the nature of atherosclerosis, and emphasized the importance of maximal medical management including strict control of blood pressure, blood glucose, and lipid levels, obtaining regular exercise,  and cessation of smoking.  The patient is aware that without maximal medical management the underlying atherosclerotic disease process will progress, limiting the benefit of any interventions.  The patient was given information about PAD including signs, symptoms, treatment, what symptoms should prompt the patient to seek immediate medical care, and risk reduction measures to take.  Charisse March, RN, MSN, FNP-C [facility] Phone: [phone]  Clinic MD: Hart Rochester on call  07/10/2015 9:54 AM

## 2016-04-28 ENCOUNTER — Other Ambulatory Visit (HOSPITAL_COMMUNITY): Payer: Self-pay | Admitting: Internal Medicine

## 2016-04-28 ENCOUNTER — Ambulatory Visit (HOSPITAL_COMMUNITY)
Admission: RE | Admit: 2016-04-28 | Discharge: 2016-04-28 | Disposition: A | Discharge status: Home / Self Care | Payer: Medicare Other | Source: Ambulatory Visit | Attending: Internal Medicine | Admitting: Internal Medicine

## 2016-04-28 DIAGNOSIS — R52 Pain, unspecified: Secondary | ICD-10-CM

## 2016-04-28 DIAGNOSIS — R079 Chest pain, unspecified: Secondary | ICD-10-CM | POA: Diagnosis not present

## 2016-04-28 DIAGNOSIS — R918 Other nonspecific abnormal finding of lung field: Secondary | ICD-10-CM | POA: Diagnosis not present

## 2016-04-28 DIAGNOSIS — I700 Atherosclerosis of aorta: Secondary | ICD-10-CM | POA: Insufficient documentation

## 2016-07-03 ENCOUNTER — Encounter: Payer: Self-pay | Admitting: Vascular Surgery

## 2016-07-15 ENCOUNTER — Inpatient Hospital Stay (HOSPITAL_COMMUNITY): Admission: RE | Admit: 2016-07-15 | Payer: Medicare Other | Source: Ambulatory Visit

## 2016-07-15 ENCOUNTER — Ambulatory Visit: Payer: Medicare Other | Admitting: Family

## 2016-08-19 ENCOUNTER — Other Ambulatory Visit: Payer: Self-pay | Admitting: Internal Medicine

## 2016-08-19 ENCOUNTER — Encounter: Payer: Self-pay | Admitting: Family

## 2016-08-19 DIAGNOSIS — R0989 Other specified symptoms and signs involving the circulatory and respiratory systems: Secondary | ICD-10-CM

## 2016-08-28 ENCOUNTER — Ambulatory Visit (INDEPENDENT_AMBULATORY_CARE_PROVIDER_SITE_OTHER): Payer: Medicare Other | Admitting: Family

## 2016-08-28 ENCOUNTER — Ambulatory Visit (HOSPITAL_COMMUNITY)
Admission: RE | Admit: 2016-08-28 | Discharge: 2016-08-28 | Disposition: A | Discharge status: Home / Self Care | Payer: Medicare Other | Source: Ambulatory Visit | Attending: Family | Admitting: Family

## 2016-08-28 ENCOUNTER — Encounter: Payer: Self-pay | Admitting: Family

## 2016-08-28 VITALS — BP 100/53 | HR 50 | Temp 97.8°F | Resp 16 | Ht 66.0 in | Wt 167.0 lb

## 2016-08-28 DIAGNOSIS — I7409 Other arterial embolism and thrombosis of abdominal aorta: Secondary | ICD-10-CM

## 2016-08-28 DIAGNOSIS — F17200 Nicotine dependence, unspecified, uncomplicated: Secondary | ICD-10-CM

## 2016-08-28 DIAGNOSIS — I70209 Unspecified atherosclerosis of native arteries of extremities, unspecified extremity: Secondary | ICD-10-CM | POA: Diagnosis present

## 2016-08-28 DIAGNOSIS — I70213 Atherosclerosis of native arteries of extremities with intermittent claudication, bilateral legs: Secondary | ICD-10-CM

## 2016-08-28 NOTE — Patient Instructions (Signed)
Peripheral Vascular Disease Peripheral vascular disease (PVD) is a disease of the blood vessels that are not part of your heart and brain. A simple term for PVD is poor circulation. In most cases, PVD narrows the blood vessels that carry blood from your heart to the rest of your body. This can result in a decreased supply of blood to your arms, legs, and internal organs, like your stomach or kidneys. However, it most often affects a person's lower legs and feet. There are two types of PVD.  Organic PVD. This is the more common type. It is caused by damage to the structure of blood vessels.  Functional PVD. This is caused by conditions that make blood vessels contract and tighten (spasm). Without treatment, PVD tends to get worse over time. PVD can also lead to acute ischemic limb. This is when an arm or limb suddenly has trouble getting enough blood. This is a medical emergency. Follow these instructions at home:  Take medicines only as told by your doctor.  Do not use any tobacco products, including cigarettes, chewing tobacco, or electronic cigarettes. If you need help quitting, ask your doctor.  Lose weight if you are overweight, and maintain a healthy weight as told by your doctor.  Eat a diet that is low in fat and cholesterol. If you need help, ask your doctor.  Exercise regularly. Ask your doctor for some good activities for you.  Take good care of your feet.  Wear comfortable shoes that fit well.  Check your feet often for any cuts or sores. Contact a doctor if:  You have cramps in your legs while walking.  You have leg pain when you are at rest.  You have coldness in a leg or foot.  Your skin changes.  You are unable to get or have an erection (erectile dysfunction).  You have cuts or sores on your feet that are not healing. Get help right away if:  Your arm or leg turns cold and blue.  Your arms or legs become red, warm, swollen, painful, or numb.  You have  chest pain or trouble breathing.  You suddenly have weakness in your face, arm, or leg.  You become very confused or you cannot speak.  You suddenly have a very bad headache.  You suddenly cannot see. This information is not intended to replace advice given to you by your health care provider. Make sure you discuss any questions you have with your health care provider. Document Released: 07/08/2009 Document Revised: 09/19/2015 Document Reviewed: 09/21/2013 Elsevier Interactive Patient Education  2017 Elsevier Inc.      Steps to Quit Smoking Smoking tobacco can be bad for your health. It can also affect almost every organ in your body. Smoking puts you and people around you at risk for many serious long-lasting (chronic) diseases. Quitting smoking is hard, but it is one of the best things that you can do for your health. It is never too late to quit. What are the benefits of quitting smoking? When you quit smoking, you lower your risk for getting serious diseases and conditions. They can include:  Lung cancer or lung disease.  Heart disease.  Stroke.  Heart attack.  Not being able to have children (infertility).  Weak bones (osteoporosis) and broken bones (fractures). If you have coughing, wheezing, and shortness of breath, those symptoms may get better when you quit. You may also get sick less often. If you are pregnant, quitting smoking can help to lower your chances   of having a baby of low birth weight. What can I do to help me quit smoking? Talk with your doctor about what can help you quit smoking. Some things you can do (strategies) include:  Quitting smoking totally, instead of slowly cutting back how much you smoke over a period of time.  Going to in-person counseling. You are more likely to quit if you go to many counseling sessions.  Using resources and support systems, such as:  Online chats with a counselor.  Phone quitlines.  Printed self-help  materials.  Support groups or group counseling.  Text messaging programs.  Mobile phone apps or applications.  Taking medicines. Some of these medicines may have nicotine in them. If you are pregnant or breastfeeding, do not take any medicines to quit smoking unless your doctor says it is okay. Talk with your doctor about counseling or other things that can help you. Talk with your doctor about using more than one strategy at the same time, such as taking medicines while you are also going to in-person counseling. This can help make quitting easier. What things can I do to make it easier to quit? Quitting smoking might feel very hard at first, but there is a lot that you can do to make it easier. Take these steps:  Talk to your family and friends. Ask them to support and encourage you.  Call phone quitlines, reach out to support groups, or work with a counselor.  Ask people who smoke to not smoke around you.  Avoid places that make you want (trigger) to smoke, such as:  Bars.  Parties.  Smoke-break areas at work.  Spend time with people who do not smoke.  Lower the stress in your life. Stress can make you want to smoke. Try these things to help your stress:  Getting regular exercise.  Deep-breathing exercises.  Yoga.  Meditating.  Doing a body scan. To do this, close your eyes, focus on one area of your body at a time from head to toe, and notice which parts of your body are tense. Try to relax the muscles in those areas.  Download or buy apps on your mobile phone or tablet that can help you stick to your quit plan. There are many free apps, such as QuitGuide from the CDC (Centers for Disease Control and Prevention). You can find more support from smokefree.gov and other websites. This information is not intended to replace advice given to you by your health care provider. Make sure you discuss any questions you have with your health care provider. Document Released:  02/07/2009 Document Revised: 12/10/2015 Document Reviewed: 08/28/2014 Elsevier Interactive Patient Education  2017 Elsevier Inc.  

## 2016-08-28 NOTE — Progress Notes (Signed)
[facility]   CC: Follow up peripheral artery occlusive disease  History of Present Illness Cristian Hayden is a 76 y.o. male patient of Dr. Edilia Bo who is s/p right to left femorofemoral bypass graft by Dr. Madilyn Fireman in 2010. The graft is chronically occluded. Dr. Edilia Bo has been following him with bilateral lower extremity claudication.   He saw his cardiologist in 2016, states "everything is good".   He walks about 1 block and both calves hurt equally, relieved with rest, this remains stable. He denies non healing wounds.   He lost vision in his right eye several months after surgery on his right eye in 2016.  Pt Diabetic: Yes, does not recall his last A1C, not on file Pt smoker: smoker  (3-4 cigarettes/day, started at age 46 yrs)  Pt meds include: Statin :Yes Betablocker: Yes ASA: Yes Other anticoagulants/antiplatelets: no      Past Medical History:  Diagnosis Date  . Arthritis   . Bronchitis   . CAD (coronary artery disease)   . Hyperlipidemia    and Dyslipidemia  . Hypertension   . Peripheral vascular disease (HCC)    with claludication  . PVD (peripheral vascular disease) (HCC)     Social History Social History  Substance Use Topics  . Smoking status: Current Some Day Smoker    Packs/day: 0.20    Types: Cigarettes  . Smokeless tobacco: Never Used     Comment: pt states he is trying, only smokes about 2-3 per day  . Alcohol use No     Comment: former    Family History Family History  Problem Relation Age of Onset  . Hypertension Mother   . Heart disease Father 44    CAD  . Hypertension Father   . Hypertension Sister   . Hypertension Brother     Past Surgical History:  Procedure Laterality Date  . CATARACT EXTRACTION  June 2013   Cataract Right eye  . CORONARY ARTERY BYPASS GRAFT     LIMA to LAD, SVG to PDA, SVG to OM1/OM2  . Femoral-Femoral BPGA  05/29/1999   Left profundoplasty  and Left Fem/Pop 05/28/2008    No  Known Allergies  Current Outpatient Prescriptions  Medication Sig Dispense Refill  . aspirin 81 MG tablet Take 81 mg by mouth daily.    Marland Kitchen atorvastatin (LIPITOR) 40 MG tablet     . AZOPT 1 % ophthalmic suspension Place 1 drop into both eyes 3 (three) times daily.     . bisacodyl (DULCOLAX) 5 MG EC tablet Take 5 mg by mouth daily as needed for moderate constipation.    . carvedilol (COREG) 12.5 MG tablet Take 1 tablet (12.5 mg total) by mouth 2 (two) times daily with a meal. 60 tablet 3  . dicyclomine (BENTYL) 10 MG capsule Take 1 capsule by mouth 3 (three) times daily. For 30 days  0  . L-Methylfolate-Algae-B12-B6 (METANX PO) Take 1 capsule by mouth 2 (two) times daily.    Marland Kitchen lisinopril (PRINIVIL,ZESTRIL) 20 MG tablet Take 1 tablet (20 mg total) by mouth daily. 30 tablet 3  . tamsulosin (FLOMAX) 0.4 MG CAPS capsule Take 0.4 mg by mouth at bedtime. Reported on 07/10/2015    . terbinafine (LAMISIL) 250 MG tablet Take 250 mg by mouth daily.     . timolol (TIMOPTIC) 0.5 % ophthalmic solution Place 1 drop into both eyes.      No current facility-administered medications for this visit.     ROS:  See HPI for pertinent positives and negatives.   Physical Examination  Vitals:   08/28/16 1213  BP: (!) 100/53  Pulse: (!) 50  Resp: 16  Temp: 97.8 F (36.6 C)  TempSrc: Oral  SpO2: 98%  Weight: 167 lb (75.8 kg)  Height: 5\' 6"  (1.676 m)   Body mass index is 26.95 kg/m.  General: A&O x 3, WDWN male. Gait: normal Eyes: PERRLA. Pulmonary: Respirations are non labored, CTAB, without wheezes, rales, or rhonchi. Cardiac: regular rhythm, bradycardic, no detected murmur.         Carotid Bruits Right Left   Negative Negative  Aorta is not palpable. Radial pulses: 1+ palpable and =                           VASCULAR EXAM: Extremities without ischemic changes, without Gangrene; without open wounds. Fem-fem bypass graft is palpable, but no pulse is palpable                                                                                                                                                        LE Pulses Right Left       FEMORAL  faintly palpable  not palpable        POPLITEAL  not palpable   not palpable       POSTERIOR TIBIAL  not palpable   not palpable        DORSALIS PEDIS      ANTERIOR TIBIAL not palpable  not palpable    Abdomen: soft, NT, no palpable masses. Skin: no rashes, no ulcers. Musculoskeletal: no muscle wasting or atrophy.         Neurologic: A&O X 3; Appropriate Affect ; SENSATION: normal; MOTOR FUNCTION:  moving all extremities equally, motor strength 5/5 throughout. Speech is fluent/normal.  CN 2-12 intact.   ASSESSMENT: Cristian Hayden is a 76 y.o. male who is s/p right to left femorofemoral bypass graft by Dr. Madilyn Fireman in 2010.  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 His claudication symptoms however remain stable. He has developed collateral perfusion.  He has no signs of ischemia in his feet/legs.  His atherosclerotic risk factors include active smoking, DM, CAD, and dyslipidemia.    DATA(08/28/16): ABI's remain stable with moderate arterial occlusive disease in both lower extremities, all monophasic waveforms.    PLAN:  The patient was counseled re smoking cessation and given several free resources re smoking cessation.   Graduated walking program discussed and how to achieve.   Based on the patient's vascular studies and examination, pt will return to clinic in 9 months with ABI's. I advised pt to notify us if he develops concerns re the circulation in his legs or feet.  I discussed in depth with the patient the nature of atherosclerosis, and emphasized the importance of maximal medical management including strict control of blood pressure, blood glucose, and lipid levels, obtaining regular exercise, and cessation of smoking.  The patient is  aware that without maximal medical management the underlying atherosclerotic disease process will progress, limiting the benefit of any interventions.  The patient was given information about PAD including signs, symptoms, treatment, what symptoms should prompt the patient to seek immediate medical care, and risk reduction measures to take.  Charisse MarchSuzanne Asaiah Scarber, RN, MSN, FNP-C [facility] [place] Office Phone: [phone]  Clinic MD: Imogene BurnChen  08/28/16 12:36 PM

## 2016-09-01 NOTE — Addendum Note (Signed)
Addended by: Burton ApleyPETTY, Eri Platten A on: 09/01/2016 09:40 AM   Modules accepted: Orders

## 2016-09-19 ENCOUNTER — Emergency Department (HOSPITAL_COMMUNITY)
Admission: EM | Admit: 2016-09-19 | Discharge: 2016-09-19 | Disposition: A | Discharge status: Home / Self Care | Payer: Medicare Other | Attending: Physician Assistant | Admitting: Physician Assistant

## 2016-09-19 ENCOUNTER — Emergency Department (HOSPITAL_COMMUNITY): Payer: Medicare Other

## 2016-09-19 ENCOUNTER — Encounter (HOSPITAL_COMMUNITY): Payer: Self-pay | Admitting: Radiology

## 2016-09-19 DIAGNOSIS — I10 Essential (primary) hypertension: Secondary | ICD-10-CM | POA: Insufficient documentation

## 2016-09-19 DIAGNOSIS — E119 Type 2 diabetes mellitus without complications: Secondary | ICD-10-CM | POA: Insufficient documentation

## 2016-09-19 DIAGNOSIS — N400 Enlarged prostate without lower urinary tract symptoms: Secondary | ICD-10-CM

## 2016-09-19 DIAGNOSIS — F17210 Nicotine dependence, cigarettes, uncomplicated: Secondary | ICD-10-CM | POA: Insufficient documentation

## 2016-09-19 DIAGNOSIS — R1032 Left lower quadrant pain: Secondary | ICD-10-CM

## 2016-09-19 DIAGNOSIS — K6289 Other specified diseases of anus and rectum: Secondary | ICD-10-CM | POA: Diagnosis not present

## 2016-09-19 DIAGNOSIS — N401 Enlarged prostate with lower urinary tract symptoms: Secondary | ICD-10-CM | POA: Insufficient documentation

## 2016-09-19 DIAGNOSIS — Z79899 Other long term (current) drug therapy: Secondary | ICD-10-CM | POA: Diagnosis not present

## 2016-09-19 DIAGNOSIS — I2510 Atherosclerotic heart disease of native coronary artery without angina pectoris: Secondary | ICD-10-CM | POA: Insufficient documentation

## 2016-09-19 HISTORY — DX: Type 2 diabetes mellitus without complications: E11.9

## 2016-09-19 LAB — CBC WITH DIFFERENTIAL/PLATELET
Basophils Absolute: 0 10*3/uL (ref 0.0–0.1)
Basophils Relative: 0 %
EOS PCT: 1 %
Eosinophils Absolute: 0.1 10*3/uL (ref 0.0–0.7)
HCT: 43.1 % (ref 39.0–52.0)
Hemoglobin: 14.6 g/dL (ref 13.0–17.0)
LYMPHS ABS: 3.1 10*3/uL (ref 0.7–4.0)
LYMPHS PCT: 34 %
MCH: 30.3 pg (ref 26.0–34.0)
MCHC: 33.9 g/dL (ref 30.0–36.0)
MCV: 89.4 fL (ref 78.0–100.0)
MONO ABS: 0.6 10*3/uL (ref 0.1–1.0)
MONOS PCT: 7 %
Neutro Abs: 5.2 10*3/uL (ref 1.7–7.7)
Neutrophils Relative %: 58 %
PLATELETS: 211 10*3/uL (ref 150–400)
RBC: 4.82 MIL/uL (ref 4.22–5.81)
RDW: 13.6 % (ref 11.5–15.5)
WBC: 9 10*3/uL (ref 4.0–10.5)

## 2016-09-19 LAB — URINALYSIS, ROUTINE W REFLEX MICROSCOPIC
Bilirubin Urine: NEGATIVE
GLUCOSE, UA: NEGATIVE mg/dL
Hgb urine dipstick: NEGATIVE
Ketones, ur: NEGATIVE mg/dL
Leukocytes, UA: NEGATIVE
Nitrite: NEGATIVE
Protein, ur: NEGATIVE mg/dL
SPECIFIC GRAVITY, URINE: 1.013 (ref 1.005–1.030)
pH: 7 (ref 5.0–8.0)

## 2016-09-19 LAB — BASIC METABOLIC PANEL
Anion gap: 8 (ref 5–15)
BUN: 8 mg/dL (ref 6–20)
CHLORIDE: 107 mmol/L (ref 101–111)
CO2: 24 mmol/L (ref 22–32)
Calcium: 9.5 mg/dL (ref 8.9–10.3)
Creatinine, Ser: 0.92 mg/dL (ref 0.61–1.24)
GFR calc Af Amer: >60 mL/min (ref >60)
GFR calc non Af Amer: >60 mL/min (ref >60)
GLUCOSE: 119 mg/dL — AB (ref 65–99)
Potassium: 3.5 mmol/L (ref 3.5–5.1)
Sodium: 139 mmol/L (ref 135–145)

## 2016-09-19 MED ORDER — SODIUM CHLORIDE 0.9 % IV BOLUS (SEPSIS)
500.0000 mL | Freq: Once | INTRAVENOUS | Status: AC
  Administered 2016-09-19: 500 mL via INTRAVENOUS

## 2016-09-19 MED ORDER — CIPROFLOXACIN HCL 500 MG PO TABS: 500.0000 mg | ORAL_TABLET | Freq: Two times a day (BID) | ORAL | 0 refills | Status: AC

## 2016-09-19 MED ORDER — IOPAMIDOL (ISOVUE-300) INJECTION 61%
INTRAVENOUS | Status: AC
  Administered 2016-09-19: 100 mL
  Filled 2016-09-19: qty 100

## 2016-09-19 MED ORDER — METRONIDAZOLE 500 MG PO TABS: 500.0000 mg | ORAL_TABLET | Freq: Three times a day (TID) | ORAL | 0 refills | Status: AC

## 2016-09-19 NOTE — ED Provider Notes (Signed)
[facility]-EMERGENCY DEPT Provider Note   CSN: [id number] Arrival date & time: 09/19/16  1223     History   Chief Complaint Chief Complaint  Patient presents with  . Abdominal Pain    HPI Cristian Hayden is a 76 y.o. male.  HPI   Pt presents with LLQ abdominal pain that began last night while he was straining to have a bowel movement.  The pain is constant.  Pain is worse with standing up and stretching.  Took 2 dulcolax last night, has not taken any other medication.  Denies fevers, chills, myalgias, N/V, hematochezia.  Denies testicular pain or swelling.  Denies weakness or numbness or swelling of the lower extremities.  Has intermittent constipation.  He thinks he may be on a blood thinner.    Past Medical History:  Diagnosis Date  . Arthritis   . Bronchitis   . CAD (coronary artery disease)   . Diabetes mellitus without complication (HCC)   . Hyperlipidemia    and Dyslipidemia  . Hypertension   . Peripheral vascular disease (HCC)    with claludication  . PVD (peripheral vascular disease) [facility])     Patient Active Problem List   Diagnosis Date Noted  . Stomach pain 07/06/2014  . Lumbago 07/06/2014  . Central retinal artery occlusion of right eye   . CAD (coronary artery disease), native coronary artery 12/15/2012  . Abnormal EKG 05/25/2012  . Numbness in left leg 03/08/2012  . Peripheral vascular disease, unspecified (HCC) 03/08/2012  . Cold 03/08/2012  . 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 CATARACT EXTRACTION  June 2013   Cataract Right eye  . CORONARY ARTERY BYPASS GRAFT     LIMA to LAD, SVG to PDA, SVG to OM1/OM2  . Femoral-Femoral BPGA  05/29/1999   Left profundoplasty  and Left Fem/Pop 05/28/2008       Home Medications    Prior to Admission medications   Medication Sig Start Date End Date Taking? Authorizing Provider  aspirin 81 MG tablet Take 81 mg by mouth daily.     [provider]  atorvastatin (LIPITOR) 40 MG tablet  06/02/16   [provider]  AZOPT 1 % ophthalmic suspension Place 1 drop into both eyes 3 (three) times daily.  08/03/16   [provider]  bisacodyl (DULCOLAX) 5 MG EC tablet Take 5 mg by mouth daily as needed for moderate constipation.    [provider]  carvedilol (COREG) 12.5 MG tablet Take 1 tablet (12.5 mg total) by mouth 2 (two) times daily with a meal. 09/12/12   Ghimire, Werner Lean, MD  ciprofloxacin (CIPRO) 500 MG tablet Take 1 tablet (500 mg total) by mouth 2 (two) times daily. 09/19/16 09/26/16  Trixie Dredge, PA-C  dicyclomine (BENTYL) 10 MG capsule Take 1 capsule by mouth 3 (three) times daily. For 30 days 02/06/15   [provider]  L-Methylfolate-Algae-B12-B6 (METANX PO) Take 1 capsule by mouth 2 (two) times daily.    [provider]  lisinopril (PRINIVIL,ZESTRIL) 20 MG tablet Take 1 tablet (20 mg total) by mouth daily. 09/12/12   Ghimire, Werner Lean, MD  metroNIDAZOLE (FLAGYL) 500 MG tablet Take 1 tablet (500 mg total) by mouth 3 (three) times daily. 09/19/16 09/26/16  Trixie Dredge, PA-C  tamsulosin (FLOMAX) 0.4 MG CAPS capsule Take 0.4 mg by mouth at bedtime. Reported on 07/10/2015    [provider]  terbinafine (LAMISIL)  250 MG tablet Take 250 mg by mouth daily.  08/12/16   [provider]  timolol (TIMOPTIC) 0.5 % ophthalmic solution Place 1 drop into both eyes.  07/31/16   [provider]    Family History Family History  Problem Relation Age of Onset  . Hypertension Mother   . Heart disease Father 57       CAD  . Hypertension Father   . Hypertension Sister   . Hypertension Brother     Social History Social History  Substance Use Topics  . Smoking status: Current Some Day Smoker    Packs/day: 0.20    Types: Cigarettes  . Smokeless tobacco: Never Used     Comment: pt states he is trying, only smokes about 2-3 per day  . Alcohol use No     Comment:  former     Allergies   Patient has no known allergies.   Review of Systems Review of Systems  All other systems reviewed and are negative.    Physical Exam Updated Vital Signs BP (!) 161/72 (BP Location: Left Arm)   Pulse (!) 59   Temp 98.8 F (37.1 C)   Resp 18   SpO2 97%   Physical Exam  Constitutional: He appears well-developed and well-nourished. No distress.  HENT:  Head: Normocephalic and atraumatic.  Neck: Neck supple.  Cardiovascular: Normal rate and regular rhythm.   Pulmonary/Chest: Effort normal and breath sounds normal. No respiratory distress. He has no wheezes. He has no rales.  Abdominal: Soft. He exhibits no distension and no mass. There is tenderness (mild tenderness LLQ). There is no rebound and no guarding.  Neurological: He is alert. He exhibits normal muscle tone.  Skin: He is not diaphoretic.  Nursing note and vitals reviewed.    ED Treatments / Results  Labs (all labs ordered are listed, but only abnormal results are displayed) Labs Reviewed  BASIC METABOLIC PANEL - Abnormal; Notable for the following:       Result Value   Glucose, Bld 119 (*)    All other components within normal limits  URINALYSIS, ROUTINE W REFLEX MICROSCOPIC  CBC WITH DIFFERENTIAL/PLATELET    EKG  EKG Interpretation None       Radiology Ct Abdomen Pelvis W Contrast  Result Date: 09/19/2016 CLINICAL DATA:  Acute onset of left lower quadrant abdominal pain and constipation. Initial encounter. EXAM: CT ABDOMEN AND PELVIS WITH CONTRAST TECHNIQUE: Multidetector CT imaging of the abdomen and pelvis was performed using the standard protocol following bolus administration of intravenous contrast. CONTRAST:  ISOVUE-300 IOPAMIDOL (ISOVUE-300) INJECTION 61% COMPARISON:  Abdominal radiograph performed 06/04/2014, and CT of the pelvis performed 11/13/2013 FINDINGS: Lower chest: The visualized lung bases are clear. Scattered coronary artery calcifications are seen. The  heart is mildly enlarged. The patient is status post median sternotomy. A tiny hiatal hernia is seen. Hepatobiliary: The liver is unremarkable in appearance. Tiny stones are noted dependently within the gallbladder. The gallbladder is otherwise unremarkable. The common bile duct remains normal in caliber. Pancreas: The pancreas is within normal limits. Spleen: The spleen is unremarkable in appearance. Adrenals/Urinary Tract: The adrenal glands are unremarkable in appearance. A tiny right renal cyst is noted. Scattered vascular calcifications are seen at the right renal hilum. Nonspecific perinephric stranding is noted bilaterally. There is no evidence of hydronephrosis. No renal or ureteral stones are identified. Stomach/Bowel: The stomach is unremarkable in appearance. The small bowel is within normal limits. The appendix is normal in caliber, without evidence  of appendicitis. The colon is unremarkable in appearance. There is question of mild wall thickening at the rectum. This may be artifactual in nature, or could reflect mild proctitis. Vascular/Lymphatic: Diffuse calcification is seen along the abdominal aorta and its branches. There is dense calcification along the proximal superior mesenteric artery and renal arteries bilaterally, and along the inferior mesenteric artery. There is diffuse calcification along the common, internal and external iliac arteries bilaterally, with likely at least mild to moderate luminal narrowing bilaterally. There appears to be occlusion of the left common femoral artery. The patient's fem-fem bypass graft also appears to be occluded. There may be slight reconstitution of the branches of the left common femoral artery from collateral vessels. The inferior vena cava is grossly unremarkable. No retroperitoneal lymphadenopathy is seen. No pelvic sidewall lymphadenopathy is identified. Reproductive: The bladder is mildly distended and grossly unremarkable. The prostate is enlarged,  measuring 5.3 cm in transverse dimension, with nodularity displacing the base of the bladder. Would correlate with PSA. Other: Minimal nonspecific soft tissue inflammation is noted at the proximal mesentery. Musculoskeletal: No acute osseous abnormalities are identified. Mild disc space narrowing is noted at L4-L5. The visualized musculature is unremarkable in appearance. IMPRESSION: 1. No definite acute abnormality seen to explain the patient's symptoms. The colon is largely decompressed. No CT evidence of constipation. 2. Question of mild wall thickening at the rectum. This may be artifactual in nature, or could reflect mild proctitis. 3. Dense calcification along the proximal superior mesenteric artery and renal arteries bilaterally, and along the inferior mesenteric artery. Would correlate with the patient's symptoms to help exclude mild mesenteric ischemia. 4. Minimal nonspecific soft tissue inflammation at the proximal mesentery. 5. Diffuse calcification along the common, internal and external iliac arteries bilaterally, with likely at least mild to moderate luminal narrowing bilaterally. There appears to be occlusion of the left common femoral artery. The patient's fem-fem bypass graft also appears occluded. There may be slight reconstitution of branches of the left common femoral artery from collateral vessels. 6. Scattered coronary artery calcifications seen. Mild cardiomegaly. 7. Tiny hiatal hernia noted. 8. Cholelithiasis.  Gallbladder otherwise unremarkable. 9. Tiny right renal cyst noted. 10. Diffuse aortic atherosclerosis. 11. Enlarged prostate, with nodularity displacing the base of the bladder. Would correlate with PSA. Electronically Signed   By: Roanna RaiderJeffery  Chang M.D.   On: 09/19/2016 19:55    Procedures Procedures (including critical care time)  Medications Ordered in ED Medications  sodium chloride 0.9 % bolus 500 mL (0 mLs Intravenous Stopped 09/19/16 1831)  iopamidol (ISOVUE-300) 61 %  injection (100 mLs  Contrast Given 09/19/16 1902)     Initial Impression / Assessment and Plan / ED Course  I have reviewed the triage vital signs and the nursing notes.  Pertinent labs & imaging results that were available during my care of the patient were reviewed by me and considered in my medical decision making (see chart for details).    Afebrile, nontoxic patient with LLQ abdominal pain after straining for bowel movement last night.  No associated symptoms.  Pt comfortable during ED visit, repeat abdominal exam remained benign.  CT demonstrates many chronic changes. Pt has known chronic occlusion of fem fem bypass performed in 2010, followed by Dr Randie Heinzain, seen by him earlier this month.  I clinically doubt ischemic colitis.  Pt also states he is followed by Dr August Saucerean for enlarged prostate.  Will treat for proctitis given area of his pain and acute symptoms.   D/C home with cipro,  flagyl, close PCP follow up.   Discussed result, findings, treatment, and follow up  with patient.  Pt given return precautions.  Pt verbalizes understanding and agrees with plan.       Final Clinical Impressions(s) / ED Diagnoses   Final diagnoses:  Left lower quadrant pain  Proctitis  Enlarged prostate    New Prescriptions New Prescriptions   CIPROFLOXACIN (CIPRO) 500 MG TABLET    Take 1 tablet (500 mg total) by mouth 2 (two) times daily.   METRONIDAZOLE (FLAGYL) 500 MG TABLET    Take 1 tablet (500 mg total) by mouth 3 (three) times daily.     Trixie Dredge, Cordelia Poche 09/19/16 2031    Abelino Derrick, MD 09/20/16 [phone]

## 2016-09-19 NOTE — ED Notes (Signed)
Patient able to ambulate independently  

## 2016-09-19 NOTE — ED Triage Notes (Signed)
To ED for eval of left side pain since straining to have BM last evening. Pain is worse with extention of body and better when bending over. Denies difficulty with urination. States he took otc meds without relief. Pain is pinpoint and described as a pulling. No vomiting.

## 2016-09-19 NOTE — ED Notes (Signed)
Paged vascular surgery/EARLY

## 2016-09-19 NOTE — ED Notes (Signed)
Dr. Arbie CookeyEarly NOT called for this patient; actually [id number]

## 2016-09-19 NOTE — Discharge Instructions (Signed)
Read the information below.  You may return to the Emergency Department at any time for worsening condition or any new symptoms that concern you.  If you develop high fevers, worsening abdominal pain, uncontrolled vomiting, or are unable to tolerate fluids by mouth, return to the ER for a recheck.   °

## 2016-09-19 NOTE — ED Notes (Signed)
Apologies provided to patient for delays

## 2016-09-19 NOTE — ED Notes (Signed)
Patient back from CT.

## 2016-09-19 NOTE — ED Notes (Signed)
Patient made aware of need for urine.  Receiving fluids at this time.  Urinal at bedside.

## 2016-09-19 NOTE — ED Notes (Signed)
Patient up to bathroom

## 2016-10-08 ENCOUNTER — Emergency Department (HOSPITAL_COMMUNITY)
Admission: EM | Admit: 2016-10-08 | Discharge: 2016-10-08 | Disposition: A | Discharge status: Home / Self Care | Payer: Medicare Other | Attending: Emergency Medicine | Admitting: Emergency Medicine

## 2016-10-08 ENCOUNTER — Encounter (HOSPITAL_COMMUNITY): Payer: Self-pay

## 2016-10-08 ENCOUNTER — Emergency Department (HOSPITAL_COMMUNITY): Payer: Medicare Other

## 2016-10-08 DIAGNOSIS — F17210 Nicotine dependence, cigarettes, uncomplicated: Secondary | ICD-10-CM | POA: Diagnosis not present

## 2016-10-08 DIAGNOSIS — I2510 Atherosclerotic heart disease of native coronary artery without angina pectoris: Secondary | ICD-10-CM | POA: Insufficient documentation

## 2016-10-08 DIAGNOSIS — I10 Essential (primary) hypertension: Secondary | ICD-10-CM | POA: Diagnosis not present

## 2016-10-08 DIAGNOSIS — K5900 Constipation, unspecified: Secondary | ICD-10-CM | POA: Diagnosis not present

## 2016-10-08 DIAGNOSIS — R109 Unspecified abdominal pain: Secondary | ICD-10-CM

## 2016-10-08 DIAGNOSIS — E119 Type 2 diabetes mellitus without complications: Secondary | ICD-10-CM | POA: Insufficient documentation

## 2016-10-08 DIAGNOSIS — Z79899 Other long term (current) drug therapy: Secondary | ICD-10-CM | POA: Insufficient documentation

## 2016-10-08 DIAGNOSIS — Z951 Presence of aortocoronary bypass graft: Secondary | ICD-10-CM | POA: Diagnosis not present

## 2016-10-08 DIAGNOSIS — Z7982 Long term (current) use of aspirin: Secondary | ICD-10-CM | POA: Insufficient documentation

## 2016-10-08 LAB — COMPREHENSIVE METABOLIC PANEL
ALT: 47 U/L (ref 17–63)
AST: 36 U/L (ref 15–41)
Albumin: 3.9 g/dL (ref 3.5–5.0)
Alkaline Phosphatase: 82 U/L (ref 38–126)
Anion gap: 8 (ref 5–15)
BUN: 10 mg/dL (ref 6–20)
CHLORIDE: 105 mmol/L (ref 101–111)
CO2: 26 mmol/L (ref 22–32)
Calcium: 9.1 mg/dL (ref 8.9–10.3)
Creatinine, Ser: 1.03 mg/dL (ref 0.61–1.24)
Glucose, Bld: 125 mg/dL — ABNORMAL HIGH (ref 65–99)
POTASSIUM: 3.5 mmol/L (ref 3.5–5.1)
Sodium: 139 mmol/L (ref 135–145)
Total Bilirubin: 0.5 mg/dL (ref 0.3–1.2)
Total Protein: 7.3 g/dL (ref 6.5–8.1)

## 2016-10-08 LAB — CBC WITH DIFFERENTIAL/PLATELET
Basophils Absolute: 0 10*3/uL (ref 0.0–0.1)
Basophils Relative: 0 %
EOS ABS: 0.1 10*3/uL (ref 0.0–0.7)
EOS PCT: 2 %
HCT: 38.4 % — ABNORMAL LOW (ref 39.0–52.0)
Hemoglobin: 12.8 g/dL — ABNORMAL LOW (ref 13.0–17.0)
Lymphocytes Relative: 40 %
Lymphs Abs: 3.1 10*3/uL (ref 0.7–4.0)
MCH: 30.1 pg (ref 26.0–34.0)
MCHC: 33.3 g/dL (ref 30.0–36.0)
MCV: 90.4 fL (ref 78.0–100.0)
MONO ABS: 0.5 10*3/uL (ref 0.1–1.0)
Monocytes Relative: 7 %
Neutro Abs: 3.9 10*3/uL (ref 1.7–7.7)
Neutrophils Relative %: 51 %
PLATELETS: 243 10*3/uL (ref 150–400)
RBC: 4.25 MIL/uL (ref 4.22–5.81)
RDW: 14 % (ref 11.5–15.5)
WBC: 7.6 10*3/uL (ref 4.0–10.5)

## 2016-10-08 LAB — LIPASE, BLOOD: LIPASE: 23 U/L (ref 11–51)

## 2016-10-08 MED ORDER — POLYETHYLENE GLYCOL 3350 17 G PO PACK: PACK | ORAL | 0 refills | Status: DC

## 2016-10-08 MED ORDER — DOCUSATE SODIUM 100 MG PO CAPS: 100.0000 mg | ORAL_CAPSULE | Freq: Every day | ORAL | 0 refills | Status: AC

## 2016-10-08 NOTE — ED Notes (Signed)
Patient to xray.

## 2016-10-08 NOTE — ED Provider Notes (Signed)
[facility]-EMERGENCY DEPT Provider Note   CSN: [id number] Arrival date & time: 10/08/16  [id number]     History   Chief Complaint No chief complaint on file.   HPI Cristian Hayden is a 76 y.o. male.  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 Describes dull, nonradiating pain to his left side. Started approximately one month ago. Describes a "cramping feeling like there is something moving in my stomach". No change in the past 1 month. Denies fever, chills, cough, nausea, vomiting, dysuria, diarrhea. Endorses chronic constipation, last bowel movement yesterday, hard stool, nonbloody, no melena. Patient is not on anticoagulation. States that he intermittently takes milk of magnesia for his constipation. Denies regular bowel movements. Endorses a fall 2 weeks ago with some worsening of the left side of his pain. States that he slipped, grabbed onto a rail, lowered his self down to the ground. Has ambulated since that time.  Past Medical History:  Diagnosis Date  . Arthritis   . Bronchitis   . CAD (coronary artery disease)   . Diabetes mellitus without complication (HCC)   . Hyperlipidemia    and Dyslipidemia  . Hypertension   . Peripheral vascular disease (HCC)    with claludication  . PVD (peripheral vascular disease) [facility](HCC)     Patient Active Problem List   Diagnosis Date Noted  . Stomach pain 07/06/2014  . Lumbago 07/06/2014  . Central retinal artery occlusion of right eye   . CAD (coronary artery disease), native coronary artery 12/15/2012  . Abnormal EKG 05/25/2012  . Numbness in left leg 03/08/2012  . Peripheral vascular disease, unspecified (HCC) 03/08/2012  . Cold 03/08/2012  . 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 CATARACT EXTRACTION  June 2013   Cataract Right eye  . CORONARY ARTERY BYPASS GRAFT     LIMA to LAD, SVG to PDA, SVG to OM1/OM2  . Femoral-Femoral BPGA  05/29/1999   Left profundoplasty  and Left Fem/Pop 05/28/2008       Home Medications    Prior to Admission medications   Medication Sig Start Date End Date Taking? Authorizing Provider  amLODipine (NORVASC) 10 MG tablet Take 10 mg by mouth daily. 07/16/16  Yes [provider]  aspirin 81 MG tablet Take 81 mg by mouth every evening.    Yes [provider]  atorvastatin (LIPITOR) 40 MG tablet Take 40 mg by mouth daily.  06/02/16  Yes [provider]  carvedilol (COREG) 12.5 MG tablet Take 1 tablet (12.5 mg total) by mouth 2 (two) times daily with a meal. 09/12/12  Yes Ghimire, Werner LeanShanker M, MD  ciprofloxacin (CIPRO) 250 MG tablet Take 250 mg by mouth every 12 (twelve) hours. 10/01/16 10/11/16 Yes [provider]  dorzolamide-timolol (COSOPT) 22.3-6.8 MG/ML ophthalmic solution Place 1 drop into both eyes 2 (two) times daily. 09/08/16  Yes [provider]  L-Methylfolate-Algae-B12-B6 (METANX PO) Take 1 capsule by mouth 2 (two) times daily.   Yes [provider]  latanoprost (XALATAN) 0.005 % ophthalmic solution Place 1 drop into both eyes at bedtime. 09/08/16  Yes [provider]  lisinopril (PRINIVIL,ZESTRIL) 20 MG tablet Take 1 tablet (20 mg total) by mouth daily. 09/12/12  Yes  Ghimire, Werner Lean, MD  omeprazole (PRILOSEC) 40 MG capsule Take 40 mg by mouth daily. For 14 days 10/01/16 10/15/16 Yes [provider]  pentoxifylline (TRENTAL) 400 MG CR tablet Take 400 mg by mouth 2 (two) times daily. 09/03/16  Yes [provider]  tamsulosin (FLOMAX) 0.4 MG CAPS capsule Take 0.8 mg by mouth daily. Reported on 07/10/2015   Yes [provider]  terbinafine (LAMISIL) 250 MG tablet Take 250 mg by mouth  daily.  08/12/16  Yes [provider]  Vitamin D, Ergocalciferol, (DRISDOL) 50000 units CAPS capsule Take 50,000 Units by mouth every Sunday. 09/13/16  Yes [provider]  docusate sodium (COLACE) 100 MG capsule Take 1 capsule (100 mg total) by mouth daily. 10/08/16 11/07/16  Corena Herter, MD  polyethylene glycol Eather Colas / Ethelene Hal) packet Take daily until having regular bowel movements 10/08/16   Corena Herter, MD    Family History Family History  Problem Relation Age of Onset  . Hypertension Mother   . Heart disease Father 74       CAD  . Hypertension Father   . Hypertension Sister   . Hypertension Brother     Social History Social History  Substance Use Topics  . Smoking status: Current Some Day Smoker    Packs/day: 0.20    Types: Cigarettes  . Smokeless tobacco: Never Used     Comment: pt states he is trying, only smokes about 2-3 per day  . Alcohol use No     Comment: former     Allergies   Patient has no known allergies.   Review of Systems Review of Systems  Constitutional: Negative for appetite change, chills and fever.  HENT: Negative for congestion and trouble swallowing.   Eyes: Negative for visual disturbance.  Respiratory: Negative for chest tightness and shortness of breath.   Cardiovascular: Negative for chest pain and palpitations.  Gastrointestinal: Positive for abdominal pain and constipation. Negative for abdominal distention, blood in stool, nausea and vomiting.  Genitourinary: Negative for decreased urine volume, dysuria and flank pain.  Musculoskeletal: Negative for back pain and gait problem.  Skin: Negative for rash.  Neurological: Negative for dizziness, seizures, weakness, light-headedness and headaches.  Psychiatric/Behavioral: Negative for behavioral problems.     Physical Exam Updated Vital Signs BP 136/67   Pulse (!) 47   Temp 98.1 F (36.7 C) (Oral)   Resp 16   Ht 5\' 6"  (1.676 m)   Wt 74.8 kg (165 lb)   SpO2  99%   BMI 26.63 kg/m   Physical Exam  Constitutional: He is oriented to person, place, and time. He appears well-developed and well-nourished. No distress.  HENT:  Head: Atraumatic.  Mouth/Throat: Oropharynx is clear and moist.  Eyes: Conjunctivae and EOM are normal.  Neck: Normal range of motion. Neck supple.  Cardiovascular: Regular rhythm, normal heart sounds and intact distal pulses.   Pulmonary/Chest: Effort normal and breath sounds normal. No respiratory distress.  Abdominal: Soft. He exhibits no distension and no mass. There is no tenderness. There is no guarding.  Mild tenderness to palpation of the left abdominal wall.  Genitourinary:  Genitourinary Comments: DRE without significant tenderness. No gross blood or melena.  Musculoskeletal: Normal range of motion.  Neurological: He is alert and oriented to person, place, and time.  Skin: Skin is warm.  Psychiatric: He has a normal mood and affect.     ED Treatments / Results  Labs (all labs ordered are listed, but only abnormal results are  displayed) Labs Reviewed  CBC WITH DIFFERENTIAL/PLATELET - Abnormal; Notable for the following:       Result Value   Hemoglobin 12.8 (*)    HCT 38.4 (*)    All other components within normal limits  COMPREHENSIVE METABOLIC PANEL - Abnormal; Notable for the following:    Glucose, Bld 125 (*)    All other components within normal limits  LIPASE, BLOOD    EKG  EKG Interpretation None       Radiology Dg Abdomen Acute W/chest  Result Date: 10/08/2016 CLINICAL DATA:  Diffuse left abdominal pain. History of constipation. EXAM: DG ABDOMEN ACUTE W/ 1V CHEST COMPARISON:  04/28/2016 chest radiograph. 06/04/2014 abdominal radiographs. FINDINGS: Intact sternotomy wires. CABG clips overlie the mediastinum. Stable cardiomediastinal silhouette with normal heart size and aortic atherosclerosis. No pneumothorax. No pleural effusion. No dilated small bowel loops or air-fluid levels. Moderate  diffuse colonic distention with gas and stool. No evidence of pneumatosis or pneumoperitoneum. No radiopaque urolithiasis. Vascular stent overlies the left iliac arteries. IMPRESSION: 1. No active disease in the chest. 2. Nonobstructive bowel gas pattern. 3. Moderate colonic distention with gas and stool, compatible with constipation. Electronically Signed   By: Delbert Phenix M.D.   On: 10/08/2016 11:21    Procedures Procedures (including critical care time)  Medications Ordered in ED Medications - No data to display   Initial Impression / Assessment and Plan / ED Course  I have reviewed the triage vital signs and the nursing notes.  Pertinent labs & imaging results that were available during my care of the patient were reviewed by me and considered in my medical decision making (see chart for details).    Patient is a 76 year old male with past medical history significant for chronic occlusion of left femoropopliteal bypass, who presents to the emergency department with a 1 month history of left-sided abdominal pain without associated symptoms. Chronic constipation without melena or blood in his stool. Patient was evaluated in the ED on 09/19/16 with similar symptoms, CT scan was obtained and showed possible prostatitis. The patient was started on antibiotics at that time. No other acute findings. No improvement of his pain following antibiotics.  On arrival in no acute distress, not ill appearing. Afebrile, hemodynamically stable. Patient with a benign abdominal exam without signs of peritonitis. Lab work showed no leukocytosis. No significant lateral abnormalities. X-ray of his acute abdomen showed signs of constipation without signs of bowel obstruction. Hemoglobin 12.8 (14 and baseline).  Do not feel that the patient needs repeat CT imaging at this time given that he has had a CT scan with the same symptoms that have been unchanged. Possibly musculoskeletal pain secondary to stretch injury and  fall 2 weeks ago. Most likely with constipation. Rectal exam with no signs of GI bleeding. Discussed Colace and MiraLAX with the patient.  Patient is stable for discharge home. Discussed follow-up with his primary care physician in the next couple of days. Instructed term percussions to the emergency department. Patient expressed understanding, no questions or concerns, discharged.  Final Clinical Impressions(s) / ED Diagnoses   Final diagnoses:  Constipation, unspecified constipation type  Left sided abdominal pain    New Prescriptions Discharge Medication List as of 10/08/2016  1:28 PM    START taking these medications   Details  docusate sodium (COLACE) 100 MG capsule Take 1 capsule (100 mg total) by mouth daily., Starting Thu 10/08/2016, Until Sat 11/07/2016, Print    polyethylene glycol (MIRALAX / GLYCOLAX) packet Take daily until  having regular bowel movements, Print         Corena Herter, MD 10/08/16 1714    Arby Barrette, MD 10/08/16 1755

## 2016-10-08 NOTE — ED Provider Notes (Signed)
I saw and evaluated the patient, reviewed the resident's note and I agree with the findings and plan.   EKG Interpretation None     Patient describes left-sided discomfort that extends all the way from his upper mid chest to his hip. This is a waxing and waning issue. It has been present for several weeks. He describes it is intermittently being in "rolling" sensation. It is not particularly associated with any movement or activity.  On examination, patient is alert and appropriate. Heart is regular. Lungs are clear without gross wheezes rhonchi rale. Abdomen is soft. Deep palpation does not really reproduce the pain. He reports is somewhat uncomfortable but is not precipitating the discomfort that he experiences one is present. No reproducible discomfort to palpation in the groin. He has known vascular occlusion of his bypass to the left lower extremity. Left lower extremity however is warm with good skin condition and no significant edema or calf tenderness.   I agree with plan and management of basic diagnostic testing. Patient has had CT scan within the last 3 weeks. This does not appear to be an immediate surgical or vascular emergency. I agree with plan of management.   Arby BarrettePfeiffer, Chevelle Durr, MD 10/08/16 [id number]

## 2016-10-08 NOTE — ED Triage Notes (Signed)
Patient seen 2 weeks ago for left hip pain and negative xray's. Reports ongoing pain with ambulation and when lying down, NAD

## 2017-06-02 ENCOUNTER — Ambulatory Visit: Payer: Medicare Other | Admitting: Family

## 2017-06-02 ENCOUNTER — Encounter (HOSPITAL_COMMUNITY): Payer: Medicare Other

## 2017-06-03 ENCOUNTER — Encounter: Payer: Self-pay | Admitting: Family

## 2017-06-03 ENCOUNTER — Ambulatory Visit (HOSPITAL_COMMUNITY)
Admission: RE | Admit: 2017-06-03 | Discharge: 2017-06-03 | Disposition: A | Discharge status: Home / Self Care | Payer: Medicare Other | Source: Ambulatory Visit | Attending: Family | Admitting: Family

## 2017-06-03 ENCOUNTER — Ambulatory Visit: Payer: Medicare Other | Admitting: Family

## 2017-06-03 ENCOUNTER — Other Ambulatory Visit: Payer: Self-pay

## 2017-06-03 ENCOUNTER — Encounter (HOSPITAL_COMMUNITY): Payer: Medicare Other

## 2017-06-03 VITALS — BP 118/72 | HR 49 | Temp 97.5°F | Resp 16 | Ht 66.0 in | Wt 160.0 lb

## 2017-06-03 DIAGNOSIS — I10 Essential (primary) hypertension: Secondary | ICD-10-CM | POA: Diagnosis not present

## 2017-06-03 DIAGNOSIS — I70213 Atherosclerosis of native arteries of extremities with intermittent claudication, bilateral legs: Secondary | ICD-10-CM

## 2017-06-03 DIAGNOSIS — Z95828 Presence of other vascular implants and grafts: Secondary | ICD-10-CM | POA: Insufficient documentation

## 2017-06-03 DIAGNOSIS — F17200 Nicotine dependence, unspecified, uncomplicated: Secondary | ICD-10-CM | POA: Diagnosis not present

## 2017-06-03 DIAGNOSIS — I7409 Other arterial embolism and thrombosis of abdominal aorta: Secondary | ICD-10-CM | POA: Diagnosis not present

## 2017-06-03 DIAGNOSIS — E1151 Type 2 diabetes mellitus with diabetic peripheral angiopathy without gangrene: Secondary | ICD-10-CM | POA: Diagnosis not present

## 2017-06-03 NOTE — Patient Instructions (Addendum)
Steps to Quit Smoking Smoking tobacco can be bad for your health. It can also affect almost every organ in your body. Smoking puts you and people around you at risk for many serious long-lasting (chronic) diseases. Quitting smoking is hard, but it is one of the best things that you can do for your health. It is never too late to quit. What are the benefits of quitting smoking? When you quit smoking, you lower your risk for getting serious diseases and conditions. They can include:  Lung cancer or lung disease.  Heart disease.  Stroke.  Heart attack.  Not being able to have children (infertility).  Weak bones (osteoporosis) and broken bones (fractures).  If you have coughing, wheezing, and shortness of breath, those symptoms may get better when you quit. You may also get sick less often. If you are pregnant, quitting smoking can help to lower your chances of having a baby of low birth weight. What can I do to help me quit smoking? Talk with your doctor about what can help you quit smoking. Some things you can do (strategies) include:  Quitting smoking totally, instead of slowly cutting back how much you smoke over a period of time.  Going to in-person counseling. You are more likely to quit if you go to many counseling sessions.  Using resources and support systems, such as: ? Online chats with a counselor. ? Phone quitlines. ? Printed self-help materials. ? Support groups or group counseling. ? Text messaging programs. ? Mobile phone apps or applications.  Taking medicines. Some of these medicines may have nicotine in them. If you are pregnant or breastfeeding, do not take any medicines to quit smoking unless your doctor says it is okay. Talk with your doctor about counseling or other things that can help you.  Talk with your doctor about using more than one strategy at the same time, such as taking medicines while you are also going to in-person counseling. This can help make  quitting easier. What things can I do to make it easier to quit? Quitting smoking might feel very hard at first, but there is a lot that you can do to make it easier. Take these steps:  Talk to your family and friends. Ask them to support and encourage you.  Call phone quitlines, reach out to support groups, or work with a counselor.  Ask people who smoke to not smoke around you.  Avoid places that make you want (trigger) to smoke, such as: ? Bars. ? Parties. ? Smoke-break areas at work.  Spend time with people who do not smoke.  Lower the stress in your life. Stress can make you want to smoke. Try these things to help your stress: ? Getting regular exercise. ? Deep-breathing exercises. ? Yoga. ? Meditating. ? Doing a body scan. To do this, close your eyes, focus on one area of your body at a time from head to toe, and notice which parts of your body are tense. Try to relax the muscles in those areas.  Download or buy apps on your mobile phone or tablet that can help you stick to your quit plan. There are many free apps, such as QuitGuide from the CDC (Centers for Disease Control and Prevention). You can find more support from smokefree.gov and other websites.  This information is not intended to replace advice given to you by your health care provider. Make sure you discuss any questions you have with your health care provider. Document Released: 02/07/2009 Document   Revised: 12/10/2015 Document Reviewed: 08/28/2014 Elsevier Interactive Patient Education  2018 [employer]     Intermittent Claudication Intermittent claudication is pain in your leg that occurs when you walk or exercise and goes away when you rest. The pain can occur in one or both legs. What are the causes? Intermittent claudication is caused by the buildup of plaque within the major arteries in the body (atherosclerosis). The plaque, which makes arteries stiff and narrow, prevents enough blood from reaching your  leg muscles. The pain occurs when you walk or exercise because your muscles need more blood when you are moving and exercising. What increases the risk? Risk factors include:  A family history of atherosclerosis.  A personal history of stroke or heart disease.  Older age.  Being inactive or overweight.  Smoking cigarettes.  Having another health condition such as: ? Diabetes. ? High blood pressure. ? High cholesterol.  What are the signs or symptoms? Your hip or leg may:  Ache.  Cramp.  Feel tight.  Feel weak.  Feel heavy.  Over time, you may feel pain in your calf, thigh, or hip. How is this diagnosed? Your health care provider may diagnose intermittent claudication based on your symptoms and medical history. Your health care provider may also do tests to learn more about your condition. These may include:  Blood tests.  An ultrasound.  Imaging tests such as angiography, magnetic resonance angiography (MRA), and computed tomography angiography (CTA).  How is this treated? You may be treated for problems such as:  High blood pressure.  High cholesterol.  Diabetes.  Other treatments may include:  Lifestyle changes such as: ? Starting an exercise program. ? Losing weight. ? Quitting smoking.  Medicines to help restore blood flow through your legs.  Blood vessel surgery (angioplasty) to restore blood flow if your intermittent claudication is caused by severe peripheral artery disease.  Follow these instructions at home:  Manage any other health conditions you have.  Eat a diet low in saturated fats and calories to maintain a healthy weight.  Quit smoking, if you smoke.  Take medicines only as directed by your health care provider.  If your health care provider recommended an exercise program for you, follow it as directed. Your exercise program may involve: ? Walking three or more times a week. ? Walking until you have certain symptoms of  intermittent claudication. ? Resting until symptoms go away. ? Gradually increasing walking time to about 50 minutes a day. Contact a health care provider if: Your condition is not getting better or is getting worse. Get help right away if:  You have chest pain.  You have difficulty breathing.  You develop arm weakness.  You have trouble speaking.  Your face begins to droop. This information is not intended to replace advice given to you by your health care provider. Make sure you discuss any questions you have with your health care provider. Document Released: 02/14/2004 Document Revised: 09/19/2015 Document Reviewed: 07/20/2013 Elsevier Interactive Patient Education  2017 [employer] Inc.  

## 2017-06-03 NOTE — Progress Notes (Signed)
CC: Follow up Intermittent Claudication  History of Present Illness  Cristian Hayden is a 77 y.o. (1940-09-16) male who is s/p right to left femorofemoral bypass graft , left profundoplasty and left fem-pop bypass graft by Dr. Madilyn Fireman in 2010. The graft is chronically occluded. Dr. Edilia Bo has been following him with bilateral lower extremity claudication.   He saw his cardiologist in 2016, states "everything is good".  He walks about 1 block and both calves hurt equally, relieved with rest, this remains stable. He denies non healing wounds.   He lost vision in his right eye several months after surgery on his right eye in 2016.  Pt Diabetic: Yes, does not recall his last A1C, not on file Pt smoker: smoker (2-3 cigarettes/day, started at age 84 yrs)  Pt meds include: Statin :Yes Betablocker: Yes ASA: Yes Other anticoagulants/antiplatelets: no   Past Medical History:  Diagnosis Date  . Arthritis   . Bronchitis   . CAD (coronary artery disease)   . Diabetes mellitus without complication (HCC)   . Hyperlipidemia    and Dyslipidemia  . Hypertension   . Peripheral vascular disease (HCC)    with claludication  . PVD (peripheral vascular disease) (HCC)     Social History Social History   Tobacco Use  . Smoking status: Current Some Day Smoker    Packs/day: 0.20    Types: Cigarettes  . Smokeless tobacco: Never Used  . Tobacco comment: pt states he is trying, only smokes about 2-3 per day  Substance Use Topics  . Alcohol use: No    Comment: former  . Drug use: No    Family History Family History  Problem Relation Age of Onset  . Hypertension Mother   . Heart disease Father 31       CAD  . Hypertension Father   . Hypertension Sister   . Hypertension Brother     Surgical History Past Surgical History:  Procedure Laterality Date  . CATARACT EXTRACTION  June 2013   Cataract Right eye  . CORONARY ARTERY BYPASS GRAFT     LIMA to LAD, SVG to PDA, SVG to  OM1/OM2  . Femoral-Femoral BPGA  05/29/1999   Left profundoplasty  and Left Fem/Pop 05/28/2008    No Known Allergies  Current Outpatient Medications  Medication Sig Dispense Refill  . amLODipine (NORVASC) 10 MG tablet Take 10 mg by mouth daily.    Marland Kitchen aspirin 81 MG tablet Take 81 mg by mouth every evening.     Marland Kitchen atorvastatin (LIPITOR) 40 MG tablet Take 40 mg by mouth daily.     . carvedilol (COREG) 12.5 MG tablet Take 1 tablet (12.5 mg total) by mouth 2 (two) times daily with a meal. 60 tablet 3  . dorzolamide-timolol (COSOPT) 22.3-6.8 MG/ML ophthalmic solution Place 1 drop into both eyes 2 (two) times daily.  4  . L-Methylfolate-Algae-B12-B6 (METANX PO) Take 1 capsule by mouth 2 (two) times daily.    Marland Kitchen latanoprost (XALATAN) 0.005 % ophthalmic solution Place 1 drop into both eyes at bedtime.  4  . lisinopril (PRINIVIL,ZESTRIL) 20 MG tablet Take 1 tablet (20 mg total) by mouth daily. 30 tablet 3  . pentoxifylline (TRENTAL) 400 MG CR tablet Take 400 mg by mouth 2 (two) times daily.  5  . polyethylene glycol (MIRALAX / GLYCOLAX) packet Take daily until having regular bowel movements 100 each 0  . tamsulosin (FLOMAX) 0.4 MG CAPS capsule Take 0.8 mg by mouth daily. Reported on 07/10/2015    .  terbinafine (LAMISIL) 250 MG tablet Take 250 mg by mouth daily.     . Vitamin D, Ergocalciferol, (DRISDOL) 50000 units CAPS capsule Take 50,000 Units by mouth every Sunday.  0  . omeprazole (PRILOSEC) 40 MG capsule Take 40 mg by mouth daily. For 14 days  0   No current facility-administered medications for this visit.     REVIEW OF SYSTEMS: see HPI for pertinent positives and negatives    Physical Examination  Vitals:   06/03/17 1114 06/03/17 1117  BP: 132/71 118/72  Pulse: (!) 49   Resp: 16   Temp: (!) 97.5 F (36.4 C)   TempSrc: Core   SpO2: 98%   Weight: 160 lb (72.6 kg)   Height: 5\' 6"  (1.676 m)    Body mass index is 25.82 kg/m.  General: A&O x 3, WDWN male. Gait: normal HEENT: No gross  abnormalities Pulmonary: Respirations are non labored, CTAB, without wheezes, rales, or rhonchi. Cardiac: regular rhythm, bradycardic (on a beta blocker), no detected murmur.   Carotid Bruits Right Left   Negative Negative   Abdominal aortic pulse is not palpable. Radial pulses: 1+ palpable and =   VASCULAR EXAM: Extremitieswithoutischemic changes, withoutGangrene; withoutopen wounds. Fem-fem bypass graft is palpable, but pulse is not palpable  LE Pulses Right Left  FEMORAL faintly palpable not palpable   POPLITEAL not palpable  not palpable  POSTERIOR TIBIAL not palpable  not palpable   DORSALIS PEDIS ANTERIOR TIBIAL not palpable  not palpable    Abdomen: soft, NT, no palpable masses. Skin: no rashes, no ulcers. Musculoskeletal: no muscle wasting or atrophy. Neurologic: A&O X 3; appropriate affect, sensation is normal; MOTOR FUNCTION: moving all extremities equally, motor strength 5/5 throughout. Speech is fluent/normal. CN 2-12 intact. Psychiatric: Normal thought content, mood appropriate for clinical situation.    DATA  ABI (Date: 06/03/2017)  R: 0.58 (was 0.56 on 08-28-16 ), DP: waveform morphology not documented, PT: waveform morphology not documented, TBI: 0.64 (was 0.36)  L: 0.65 (was 0.54), DP: waveform morphology not documented, PT: waveform morphology not documented, TBI: 0.42 (was 0.34)  ABI's are stable on the right, slight improvement on the left, moderate bilateral disease. Bilateral TBI's are slightly improved.    Medical Decision Making  Cristian Hayden is a 77 y.o. male whowho is s/p right to left femorofemoral bypass graft by Dr. Madilyn Fireman in 2010.  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 His claudication symptoms however remain stable. He has developed collateral perfusion.  He has no signs of  ischemia in his feet/legs.  His atherosclerotic risk factors include active smoking, DM, CAD, and dyslipidemia.   He takes a daily statin and ASA.    PLAN:  The patient was counseled re smoking cessation and given several free resources re smoking cessation.  Graduated walking program discussed and how to achieve.    Based on the patient's vascular studies and examination, pt will return to clinic in 1 yearwith ABI's. I advised pt to notify us if he develops concerns re the circulation in his legs or feet.   I discussed in depth with the patient the nature of atherosclerosis, and emphasized the importance of maximal medical management including strict control of blood pressure, blood glucose, and lipid levels, antiplatelet agents, obtaining regular exercise, and cessation of smoking.         I discussed with the patient the natural history of intermittent claudication: 75% of patients have stable or improved symptoms  in a year an only 2% require amputation. Eventually 20% may require intervention in a year.  The patient is aware that without maximal medical management the underlying atherosclerotic disease process will progress, limiting the benefit of any interventions.  Thank you for allowing us to participate in this patient's care.  Charisse MarchSuzanne Rio Taber, RN, MSN, FNP-C [facility] Office: [phone]  06/03/2017, 11:20 AM  Clinic MD: Darrick PennaFields

## 2017-09-14 ENCOUNTER — Other Ambulatory Visit: Payer: Self-pay

## 2017-09-14 ENCOUNTER — Encounter (HOSPITAL_COMMUNITY): Payer: Self-pay | Admitting: *Deleted

## 2017-09-14 ENCOUNTER — Emergency Department (HOSPITAL_COMMUNITY): Payer: Medicare Other

## 2017-09-14 ENCOUNTER — Emergency Department (HOSPITAL_COMMUNITY)
Admission: EM | Admit: 2017-09-14 | Discharge: 2017-09-14 | Disposition: A | Discharge status: Home / Self Care | Payer: Medicare Other | Attending: Emergency Medicine | Admitting: Emergency Medicine

## 2017-09-14 DIAGNOSIS — E119 Type 2 diabetes mellitus without complications: Secondary | ICD-10-CM | POA: Diagnosis not present

## 2017-09-14 DIAGNOSIS — Z951 Presence of aortocoronary bypass graft: Secondary | ICD-10-CM | POA: Diagnosis not present

## 2017-09-14 DIAGNOSIS — I10 Essential (primary) hypertension: Secondary | ICD-10-CM | POA: Insufficient documentation

## 2017-09-14 DIAGNOSIS — Z79899 Other long term (current) drug therapy: Secondary | ICD-10-CM | POA: Insufficient documentation

## 2017-09-14 DIAGNOSIS — Z7982 Long term (current) use of aspirin: Secondary | ICD-10-CM | POA: Diagnosis not present

## 2017-09-14 DIAGNOSIS — I2510 Atherosclerotic heart disease of native coronary artery without angina pectoris: Secondary | ICD-10-CM | POA: Insufficient documentation

## 2017-09-14 DIAGNOSIS — M545 Low back pain, unspecified: Secondary | ICD-10-CM

## 2017-09-14 DIAGNOSIS — F17210 Nicotine dependence, cigarettes, uncomplicated: Secondary | ICD-10-CM | POA: Diagnosis not present

## 2017-09-14 MED ORDER — ACETAMINOPHEN 325 MG PO TABS
650.0000 mg | ORAL_TABLET | Freq: Once | ORAL | Status: AC
  Administered 2017-09-14: 650 mg via ORAL
  Filled 2017-09-14: qty 2

## 2017-09-14 NOTE — ED Notes (Signed)
Xray contacted regarding pending scan, states they are coming to get the patient soon.

## 2017-09-14 NOTE — ED Notes (Signed)
Xray contacted regarding pending scan, states they will come get the patient next

## 2017-09-14 NOTE — ED Triage Notes (Signed)
Since last Friday started experiencing lower back pain and is wearing a support belt.  Pt states when he stands up it feels like everything goes down in his pelvic area. No incontinence.  No falls.  Pt states he has had this before and it was almost over a year since he has had anything. No numbness or tingling in his labs

## 2017-09-14 NOTE — ED Provider Notes (Signed)
[facility] EMERGENCY DEPARTMENT Provider Note   CSN: [id number] Arrival date & time: 09/14/17  7829     History   Chief Complaint Chief Complaint  Patient presents with  . Back Pain    HPI Cristian Hayden is a 77 y.o. male.  HPI   77 year old male presents today with low back pain.  Patient reports 4 days ago he was moving a lawnmower in his shed when he felt pain in his lower back.  Patient reports he cannot describe this discomfort.  Patient notes that the symptoms are worse with standing, worse with palpation or movement.  He denies any significant abdominal pain, chest pain or shortness of breath.  Patient denies any distal neurological deficits.  Patient reports he has had these symptoms in the past.  Patient notes taking "Dawn" pill that he bought over-the-counter that did not improve his symptoms, also notes he took Tylenol.  Patient denies history of cancer, notes that he occasionally smokes cigarettes.  Past Medical History:  Diagnosis Date  . Arthritis   . Bronchitis   . CAD (coronary artery disease)   . Diabetes mellitus without complication (HCC)   . Hyperlipidemia    and Dyslipidemia  . Hypertension   . Peripheral vascular disease (HCC)    with claludication  . PVD (peripheral vascular disease) [facility])     Patient Active Problem List   Diagnosis Date Noted  . Stomach pain 07/06/2014  . Lumbago 07/06/2014  . Central retinal artery occlusion of right eye   . CAD (coronary artery disease), native coronary artery 12/15/2012  . Abnormal EKG 05/25/2012  . Numbness in left leg 03/08/2012  . Peripheral vascular disease, unspecified (HCC) 03/08/2012  . Cold 03/08/2012  . 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 CATARACT EXTRACTION  June 2013   Cataract Right eye  . CORONARY ARTERY BYPASS GRAFT     LIMA to LAD, SVG to PDA, SVG to OM1/OM2  . Femoral-Femoral  BPGA  05/29/1999   Left profundoplasty  and Left Fem/Pop 05/28/2008        Home Medications    Prior to Admission medications   Medication Sig Start Date End Date Taking? Authorizing Provider  amLODipine (NORVASC) 10 MG tablet Take 10 mg by mouth daily. 07/16/16   [provider]  aspirin 81 MG tablet Take 81 mg by mouth every evening.     [provider]  atorvastatin (LIPITOR) 40 MG tablet Take 40 mg by mouth daily.  06/02/16   [provider]  carvedilol (COREG) 12.5 MG tablet Take 1 tablet (12.5 mg total) by mouth 2 (two) times daily with a meal. 09/12/12   Ghimire, Werner Lean, MD  dorzolamide-timolol (COSOPT) 22.3-6.8 MG/ML ophthalmic solution Place 1 drop into both eyes 2 (two) times daily. 09/08/16   [provider]  L-Methylfolate-Algae-B12-B6 (METANX PO) Take 1 capsule by mouth 2 (two) times daily.    [provider]  latanoprost (XALATAN) 0.005 % ophthalmic solution Place 1 drop into both eyes at bedtime. 09/08/16   [provider]  lisinopril (PRINIVIL,ZESTRIL) 20 MG tablet Take 1 tablet (20 mg total) by mouth daily. 09/12/12   Ghimire, Werner Lean, MD  omeprazole (PRILOSEC) 40 MG capsule Take 40 mg by mouth daily. For 14 days 10/01/16 10/15/16  [provider]  pentoxifylline (TRENTAL) 400 MG CR tablet Take 400 mg by mouth 2 (two) times daily. 09/03/16  [provider]  polyethylene glycol (MIRALAX / GLYCOLAX) packet Take daily until having regular bowel movements 10/08/16   Corena Herter, MD  tamsulosin (FLOMAX) 0.4 MG CAPS capsule Take 0.8 mg by mouth daily. Reported on 07/10/2015    [provider]  terbinafine (LAMISIL) 250 MG tablet Take 250 mg by mouth daily.  08/12/16   [provider]  Vitamin D, Ergocalciferol, (DRISDOL) 50000 units CAPS capsule Take 50,000 Units by mouth every Sunday. 09/13/16   [provider]  olmesartan-hydrochlorothiazide (BENICAR HCT) 40-12.5 MG per tablet Take 1 tablet  by mouth daily.  09/12/12  [provider]    Family History Family History  Problem Relation Age of Onset  . Hypertension Mother   . Heart disease Father 30       CAD  . Hypertension Father   . Hypertension Sister   . Hypertension Brother     Social History Social History   Tobacco Use  . Smoking status: Current Some Day Smoker    Packs/day: 0.20    Types: Cigarettes  . Smokeless tobacco: Never Used  . Tobacco comment: pt states he is trying, only smokes about 2-3 per day  Substance Use Topics  . Alcohol use: No    Comment: former  . Drug use: No     Allergies   Patient has no known allergies.   Review of Systems Review of Systems  All other systems reviewed and are negative.   Physical Exam Updated Vital Signs BP 117/61 (BP Location: Right Arm)   Pulse (!) 56   Temp 98.2 F (36.8 C) (Oral)   Resp 14   SpO2 95%   Physical Exam  Constitutional: He is oriented to person, place, and time. He appears well-developed and well-nourished.  HENT:  Head: Normocephalic and atraumatic.  Eyes: Pupils are equal, round, and reactive to light. Conjunctivae are normal. Right eye exhibits no discharge. Left eye exhibits no discharge. No scleral icterus.  Neck: Normal range of motion. No JVD present. No tracheal deviation present.  Pulmonary/Chest: Effort normal. No stridor.  Abdominal: Soft. He exhibits no distension and no mass. There is no tenderness. There is no rebound and no guarding. No hernia.  Musculoskeletal:  No C or T-spine tenderness palpation, minor tenderness palpation of the lumbar spine and surrounding soft tissue, no redness swelling or edema, bilateral lower extremity sensation strength motor function intact  Neurological: He is alert and oriented to person, place, and time. Coordination normal.  Psychiatric: He has a normal mood and affect. His behavior is normal. Judgment and thought content normal.  Nursing note and vitals reviewed.    ED  Treatments / Results  Labs (all labs ordered are listed, but only abnormal results are displayed) Labs Reviewed - No data to display  EKG None  Radiology Dg Lumbar Spine Complete  Result Date: 09/14/2017 CLINICAL DATA:  Low back pain. No known injury. History of arthritis. EXAM: LUMBAR SPINE - COMPLETE 4+ VIEW COMPARISON:  None. FINDINGS: Lumbar spine scoliosis concave left. No acute bony abnormality identified. No evidence of fracture. Aortoiliac and visceral atherosclerotic vascular disease. Left iliac stents are noted. IMPRESSION: 1. 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 No acute bony abnormality. 2. Aortoiliac and visceral atherosclerotic vascular disease. Left iliac stents noted. Electronically Signed   By: Maisie Fus  Register   On: 09/14/2017 16:12    Procedures Procedures (including critical care time)  Medications Ordered in ED Medications  acetaminophen (TYLENOL) tablet 650 mg (650 mg Oral  Given 09/14/17 1427)     Initial Impression / Assessment and Plan / ED Course  I have reviewed the triage vital signs and the nursing notes.  Pertinent labs & imaging results that were available during my care of the patient were reviewed by me and considered in my medical decision making (see chart for details).     Labs:   Imaging: DG Lumbar   Consults:  Therapeutics: Tylenol  Discharge Meds:   Assessment/Plan: 76-year male presents today with likely muscular low back pain.  Patient does have signs of degenerative changes on his plain films but denied any significant back pain prior to the event.  He has no neurological deficits, no signs of intra-abdominal or chest etiology.  Patient had some improvement with Tylenol, but still has some pain.  Patient will follow-up as an outpatient with his primary care for management of ongoing issues, he is given strict return precautions, he verbalized understanding and agreement to today's plan had no further  questions or concerns   Final Clinical Impressions(s) / ED Diagnoses   Final diagnoses:  Acute midline low back pain without sciatica    ED Discharge Orders    None       Rosalio Loud 09/14/17 1630    Eber Hong, MD 09/18/17 [phone]

## 2017-09-14 NOTE — ED Notes (Signed)
Pt states pain is worse when going from sitting to standing, once standing and walking around the pain improves, but if he sits for any extended periods the position changes generate increased pain, ambulatory with cane without distress.

## 2017-09-14 NOTE — ED Provider Notes (Signed)
Elderly 77 year old male has had pain for the last 4 or 5 days since trying to lift the lawn more.  This is located in his bilateral lower back, it is not present when he is sitting but comes on when he tries to stand up or when he tries to walk.  He denies numbness or weakness to his legs and on my exam in fact he does have tenderness in the bilateral paraspinal lumbar area.  He has no pain with straight leg raise.  He is able to move both legs with normal strength, he has normal sensation to both legs, I have watched him ambulate and he has an antalgic gait with his cane.  He is able to walk.  I doubt that he has a focal spinal cord issue, it does not appear consistent with cauda equina, he has no midline tenderness.  Anticipate discharge with supportive care and follow-up.  Medical screening examination/treatment/procedure(s) were conducted as a shared visit with non-physician practitioner(s) and myself.  I personally evaluated the patient during the encounter.  Clinical Impression:   Final diagnoses:  Acute midline low back pain without sciatica         Eber Hong, MD 09/18/17 [phone]

## 2017-09-14 NOTE — Discharge Instructions (Addendum)
Please read attached information. If you experience any new or worsening signs or symptoms please return to the emergency room for evaluation. Please follow-up with your primary care provider or specialist as discussed.  °

## 2017-10-18 ENCOUNTER — Telehealth: Payer: Self-pay | Admitting: Cardiology

## 2017-10-18 NOTE — Telephone Encounter (Signed)
Called patient and LVM to schedule followup with Dr. Antoine PocheHochrein.

## 2018-07-21 ENCOUNTER — Encounter (HOSPITAL_COMMUNITY): Payer: Medicare Other

## 2018-07-21 ENCOUNTER — Ambulatory Visit: Payer: Medicare Other | Admitting: Family

## 2018-09-21 IMAGING — CT CT ABD-PELV W/ CM
2 of 5 series · 14 of 46 positions shown, 16 images · IV contrast (APPLIED)
Comparison: Abdominal radiograph performed 06/04/2014, and CT of
the pelvis performed 11/13/2013

CLINICAL DATA: Acute onset of left lower quadrant abdominal pain
and constipation. Initial encounter.

EXAM:
CT ABDOMEN AND PELVIS WITH CONTRAST
TECHNIQUE: Multidetector CT imaging of the abdomen and pelvis was performed
using the standard protocol following bolus administration of
intravenous contrast.
CONTRAST:  100mL 5AOJYE-2NN IOPAMIDOL (5AOJYE-2NN) INJECTION 61%

[Series 3: abd/ pelvis 5.0 i30f 2 · axial · 0.80mm/px · z∈[-465,-80]mm · 11 of 87 slices shown, 13 images]
[im 5/87  soft-tissue]
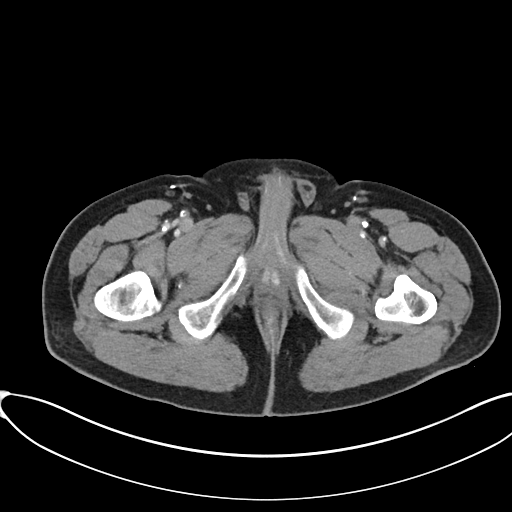
[im 5/87  bone]
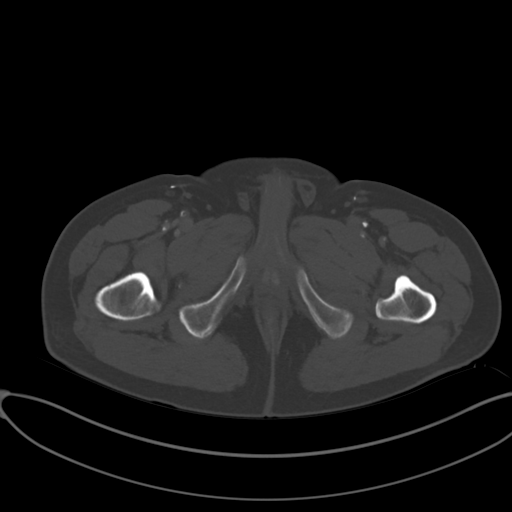
[im 15/87  soft-tissue]
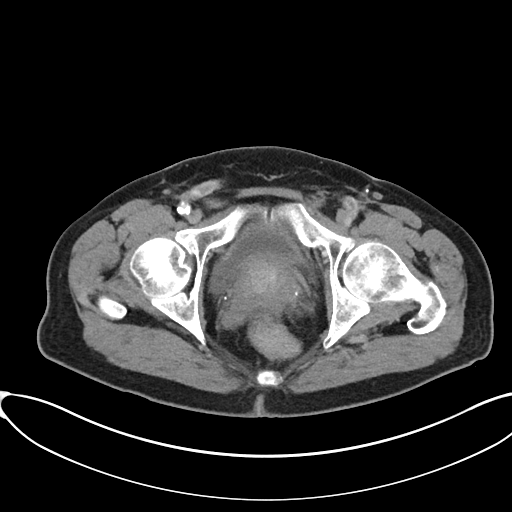
[im 20/87  soft-tissue]
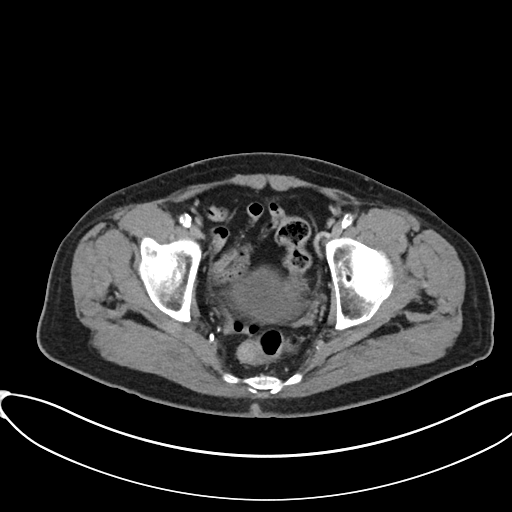
[im 29/87  soft-tissue]
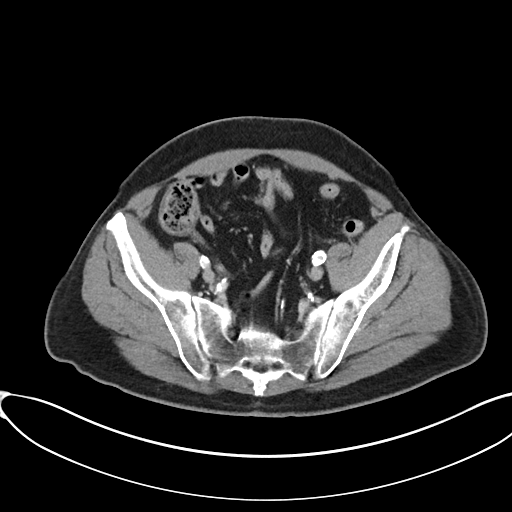
[im 34/87  soft-tissue]
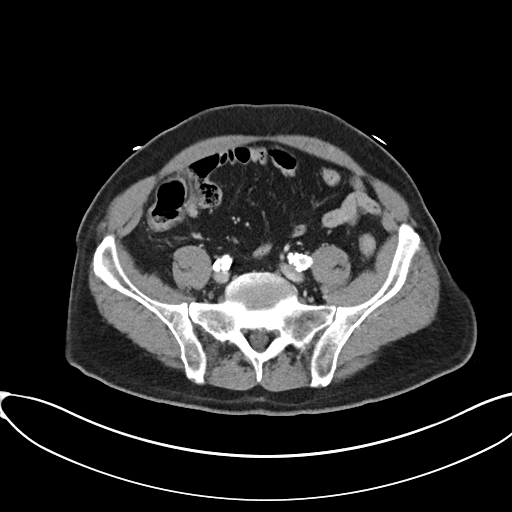
[im 44/87  soft-tissue]
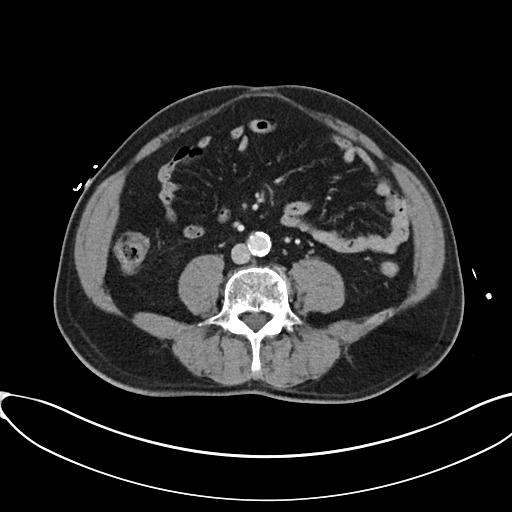
[im 53/87  soft-tissue]
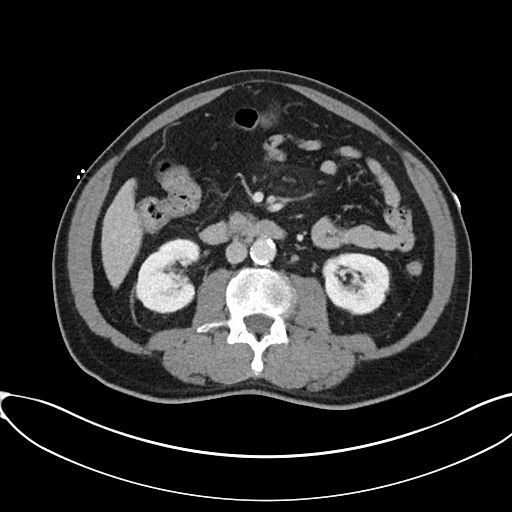
[im 58/87  soft-tissue]
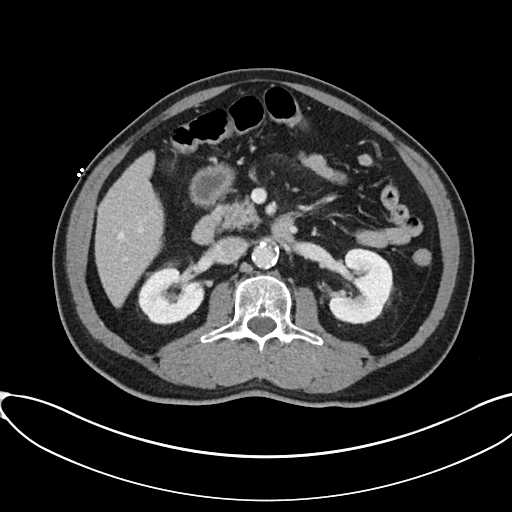
[im 67/87  soft-tissue]
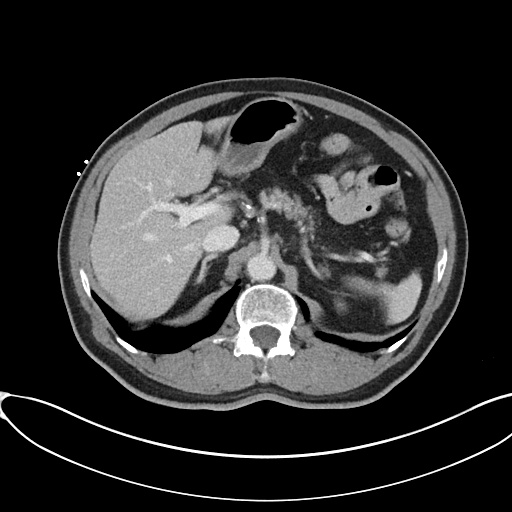
[im 67/87  bone]
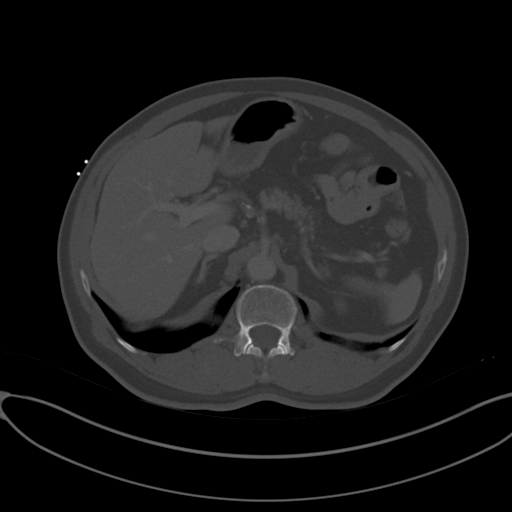
[im 72/87  soft-tissue]
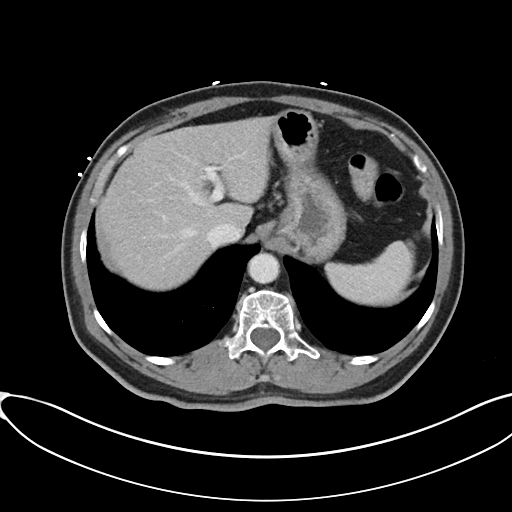
[im 82/87  soft-tissue]
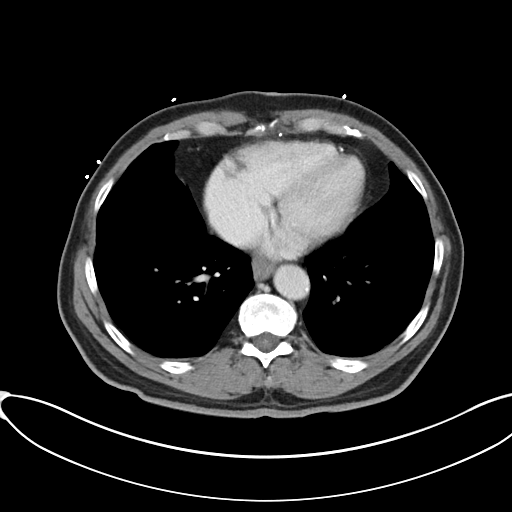

[Series 6: coronal soft tissue · coronal · 0.74mm/px · 3 of 101 slices shown]
[im 34/101  soft-tissue]
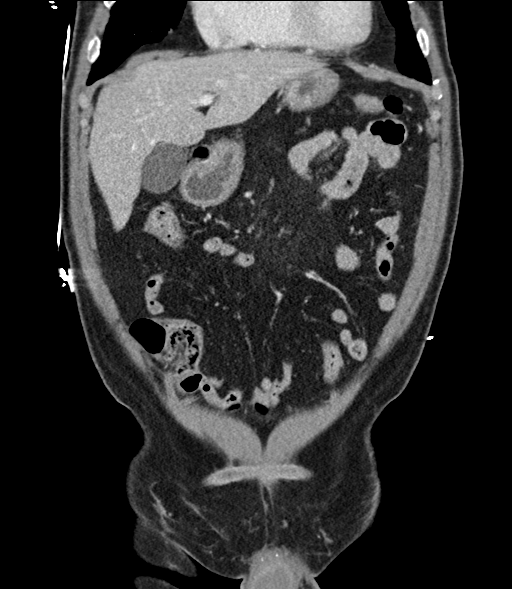
[im 45/101  soft-tissue]
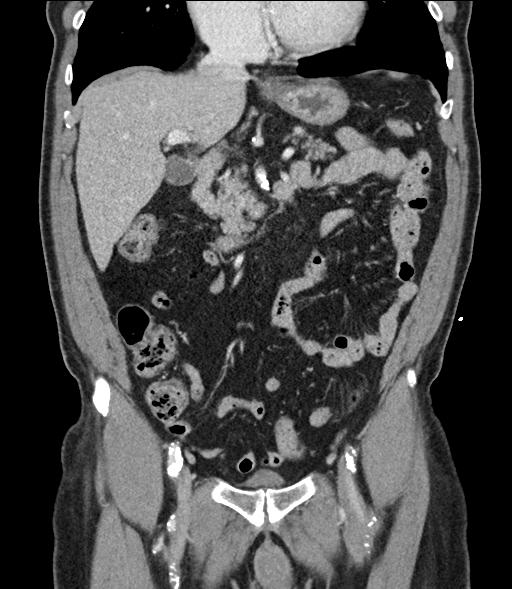
[im 56/101  soft-tissue]
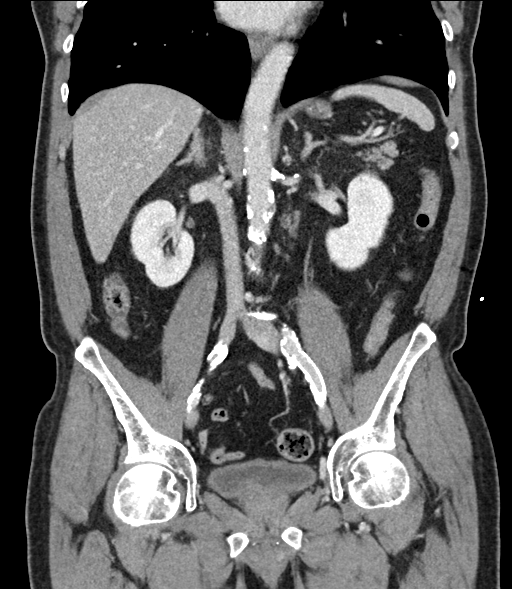

[14 of 46 positions shown; findings below may reference images not displayed]

FINDINGS: Lower chest: The visualized lung bases are clear. Scattered coronary
artery calcifications are seen. The heart is mildly enlarged. The
patient is status post median sternotomy. A tiny hiatal hernia is
seen.

Hepatobiliary: The liver is unremarkable in appearance. Tiny stones
are noted dependently within the gallbladder. The gallbladder is
otherwise unremarkable. The common bile duct remains normal in
caliber.

Pancreas: The pancreas is within normal limits.

Spleen: The spleen is unremarkable in appearance.

Adrenals/Urinary Tract: The adrenal glands are unremarkable in
appearance.

A tiny right renal cyst is noted. Scattered vascular calcifications
are seen at the right renal hilum. Nonspecific perinephric stranding
is noted bilaterally. There is no evidence of hydronephrosis. No
renal or ureteral stones are identified.

Stomach/Bowel: The stomach is unremarkable in appearance. The small
bowel is within normal limits. The appendix is normal in caliber,
without evidence of appendicitis. The colon is unremarkable in
appearance.

There is question of mild wall thickening at the rectum. This may be
artifactual in nature, or could reflect mild proctitis.

Vascular/Lymphatic: Diffuse calcification is seen along the
abdominal aorta and its branches. There is dense calcification along
the proximal superior mesenteric artery and renal arteries
bilaterally, and along the inferior mesenteric artery.

There is diffuse calcification along the common, internal and
external iliac arteries bilaterally, with likely at least mild to
moderate luminal narrowing bilaterally. There appears to be
occlusion of the left common femoral artery. The patient's fem-fem
bypass graft also appears to be occluded. There may be slight
reconstitution of the branches of the left common femoral artery
from collateral vessels.

The inferior vena cava is grossly unremarkable. No retroperitoneal
lymphadenopathy is seen. No pelvic sidewall lymphadenopathy is
identified.

Reproductive: The bladder is mildly distended and grossly
unremarkable. The prostate is enlarged, measuring 5.3 cm in
transverse dimension, with nodularity displacing the base of the
bladder. Would correlate with PSA.

Other: Minimal nonspecific soft tissue inflammation is noted at the
proximal mesentery.

Musculoskeletal: No acute osseous abnormalities are identified. Mild
disc space narrowing is noted at L4-L5. The visualized musculature
is unremarkable in appearance.
IMPRESSION: 1. No definite acute abnormality seen to explain the patient's
symptoms. The colon is largely decompressed. No CT evidence of
constipation.
2. Question of mild wall thickening at the rectum. This may be
artifactual in nature, or could reflect mild proctitis.
3. Dense calcification along the proximal superior mesenteric artery
and renal arteries bilaterally, and along the inferior mesenteric
artery. Would correlate with the patient's symptoms to help exclude
mild mesenteric ischemia.
4. Minimal nonspecific soft tissue inflammation at the proximal
mesentery.
5. Diffuse calcification along the common, internal and external
iliac arteries bilaterally, with likely at least mild to moderate
luminal narrowing bilaterally. There appears to be occlusion of the
left common femoral artery. The patient's fem-fem bypass graft also
appears occluded. There may be slight reconstitution of branches of
the left common femoral artery from collateral vessels.
6. Scattered coronary artery calcifications seen. Mild cardiomegaly.
7. Tiny hiatal hernia noted.
8. Cholelithiasis.  Gallbladder otherwise unremarkable.
9. Tiny right renal cyst noted.
10. Diffuse aortic atherosclerosis.
11. Enlarged prostate, with nodularity displacing the base of the
bladder. Would correlate with PSA.

## 2018-09-27 ENCOUNTER — Other Ambulatory Visit: Payer: Self-pay

## 2018-09-27 DIAGNOSIS — I739 Peripheral vascular disease, unspecified: Secondary | ICD-10-CM

## 2018-09-27 DIAGNOSIS — I70213 Atherosclerosis of native arteries of extremities with intermittent claudication, bilateral legs: Secondary | ICD-10-CM

## 2018-10-05 ENCOUNTER — Encounter: Payer: Self-pay | Admitting: Family

## 2018-10-05 ENCOUNTER — Ambulatory Visit (INDEPENDENT_AMBULATORY_CARE_PROVIDER_SITE_OTHER): Payer: Medicare Other | Admitting: Family

## 2018-10-05 ENCOUNTER — Other Ambulatory Visit: Payer: Self-pay

## 2018-10-05 ENCOUNTER — Ambulatory Visit (HOSPITAL_COMMUNITY)
Admission: RE | Admit: 2018-10-05 | Discharge: 2018-10-05 | Disposition: A | Discharge status: Home / Self Care | Payer: Medicare Other | Source: Ambulatory Visit | Attending: Family | Admitting: Family

## 2018-10-05 VITALS — BP 189/85 | HR 54 | Temp 98.1°F | Ht 66.0 in | Wt 143.7 lb

## 2018-10-05 DIAGNOSIS — I70213 Atherosclerosis of native arteries of extremities with intermittent claudication, bilateral legs: Secondary | ICD-10-CM | POA: Insufficient documentation

## 2018-10-05 DIAGNOSIS — F17200 Nicotine dependence, unspecified, uncomplicated: Secondary | ICD-10-CM

## 2018-10-05 DIAGNOSIS — I7409 Other arterial embolism and thrombosis of abdominal aorta: Secondary | ICD-10-CM | POA: Diagnosis not present

## 2018-10-05 DIAGNOSIS — I739 Peripheral vascular disease, unspecified: Secondary | ICD-10-CM

## 2018-10-05 DIAGNOSIS — R030 Elevated blood-pressure reading, without diagnosis of hypertension: Secondary | ICD-10-CM | POA: Diagnosis not present

## 2018-10-05 NOTE — Patient Instructions (Signed)
Steps to Quit Smoking  Smoking tobacco can be bad for your health. It can also affect almost every organ in your body. Smoking puts you and people around you at risk for many serious long-lasting (chronic) diseases. Quitting smoking is hard, but it is one of the best things that you can do for your health. It is never too late to quit. What are the benefits of quitting smoking? When you quit smoking, you lower your risk for getting serious diseases and conditions. They can include:  Lung cancer or lung disease.  Heart disease.  Stroke.  Heart attack.  Not being able to have children (infertility).  Weak bones (osteoporosis) and broken bones (fractures). If you have coughing, wheezing, and shortness of breath, those symptoms may get better when you quit. You may also get sick less often. If you are pregnant, quitting smoking can help to lower your chances of having a baby of low birth weight. What can I do to help me quit smoking? Talk with your doctor about what can help you quit smoking. Some things you can do (strategies) include:  Quitting smoking totally, instead of slowly cutting back how much you smoke over a period of time.  Going to in-person counseling. You are more likely to quit if you go to many counseling sessions.  Using resources and support systems, such as: ? Online chats with a counselor. ? Phone quitlines. ? Printed self-help materials. ? Support groups or group counseling. ? Text messaging programs. ? Mobile phone apps or applications.  Taking medicines. Some of these medicines may have nicotine in them. If you are pregnant or breastfeeding, do not take any medicines to quit smoking unless your doctor says it is okay. Talk with your doctor about counseling or other things that can help you. Talk with your doctor about using more than one strategy at the same time, such as taking medicines while you are also going to in-person counseling. This can help make  quitting easier. What things can I do to make it easier to quit? Quitting smoking might feel very hard at first, but there is a lot that you can do to make it easier. Take these steps:  Talk to your family and friends. Ask them to support and encourage you.  Call phone quitlines, reach out to support groups, or work with a counselor.  Ask people who smoke to not smoke around you.  Avoid places that make you want (trigger) to smoke, such as: ? Bars. ? Parties. ? Smoke-break areas at work.  Spend time with people who do not smoke.  Lower the stress in your life. Stress can make you want to smoke. Try these things to help your stress: ? Getting regular exercise. ? Deep-breathing exercises. ? Yoga. ? Meditating. ? Doing a body scan. To do this, close your eyes, focus on one area of your body at a time from head to toe, and notice which parts of your body are tense. Try to relax the muscles in those areas.  Download or buy apps on your mobile phone or tablet that can help you stick to your quit plan. There are many free apps, such as QuitGuide from the CDC (Centers for Disease Control and Prevention). You can find more support from smokefree.gov and other websites. This information is not intended to replace advice given to you by your health care provider. Make sure you discuss any questions you have with your health care provider. Document Released: 02/07/2009 Document Revised: 12/10/2015   Document Reviewed: 08/28/2014 Elsevier Interactive Patient Education  2019 Elsevier Inc.     Peripheral Vascular Disease  Peripheral vascular disease (PVD) is a disease of the blood vessels that are not part of your heart and brain. A simple term for PVD is poor circulation. In most cases, PVD narrows the blood vessels that carry blood from your heart to the rest of your body. This can reduce the supply of blood to your arms, legs, and internal organs, like your stomach or kidneys. However, PVD most  often affects a person's lower legs and feet. Without treatment, PVD tends to get worse. PVD can also lead to acute ischemic limb. This is when an arm or leg suddenly cannot get enough blood. This is a medical emergency. Follow these instructions at home: Lifestyle  Do not use any products that contain nicotine or tobacco, such as cigarettes and e-cigarettes. If you need help quitting, ask your doctor.  Lose weight if you are overweight. Or, stay at a healthy weight as told by your doctor.  Eat a diet that is low in fat and cholesterol. If you need help, ask your doctor.  Exercise regularly. Ask your doctor for activities that are right for you. General instructions  Take over-the-counter and prescription medicines only as told by your doctor.  Take good care of your feet: ? Wear comfortable shoes that fit well. ? Check your feet often for any cuts or sores.  Keep all follow-up visits as told by your doctor This is important. Contact a doctor if:  You have cramps in your legs when you walk.  You have leg pain when you are at rest.  You have coldness in a leg or foot.  Your skin changes.  You are unable to get or have an erection (erectile dysfunction).  You have cuts or sores on your feet that do not heal. Get help right away if:  Your arm or leg turns cold, numb, and blue.  Your arms or legs become red, warm, swollen, painful, or numb.  You have chest pain.  You have trouble breathing.  You suddenly have weakness in your face, arm, or leg.  You become very confused or you cannot speak.  You suddenly have a very bad headache.  You suddenly cannot see. Summary  Peripheral vascular disease (PVD) is a disease of the blood vessels.  A simple term for PVD is poor circulation. Without treatment, PVD tends to get worse.  Treatment may include exercise, low fat and low cholesterol diet, and quitting smoking. This information is not intended to replace advice given to  you by your health care provider. Make sure you discuss any questions you have with your health care provider. Document Released: 07/08/2009 Document Revised: 05/21/2016 Document Reviewed: 05/21/2016 Elsevier Interactive Patient Education  2019 Elsevier Inc.  

## 2018-10-05 NOTE — Progress Notes (Signed)
[facility] [place]   CC: Follow up peripheral artery occlusive disease  History of Present Illness Cristian Hayden is a 78 y.o. male who is s/p right to left femorofemoral bypass graft , left profundoplasty and left fem-pop bypass graft by Dr. Madilyn FiremanHayes in 2010. The graft is chronically occluded. Dr. Edilia Boickson has been following him for bilateral lower extremity claudication.   He denies headache or dizziness, denies chest pain or dyspnea at this time, but states he has at times.  He walks about 1 block and left calf hurts, relieved with rest, this remains stable. He denies non healing wounds. He states that his left lower leg has remained cold and numb most of the time for about a year, has not worsened.    [place] vision in his right eye several months after surgery on his right eye in2016.  Diabetic: Yes, does not know how that is doing, no A1C result on file Tobacco use: smoker  (5-6 cigs/day, started in his 20's)  Pt meds include: Statin :Yes Betablocker: Yes ASA: No, states he was advised a year ago by his PCP to stop, denies any bleeding problems Other anticoagulants/antiplatelets: no  Past Medical History:  Diagnosis Date  . Arthritis   . Bronchitis   . CAD (coronary artery disease)   . Diabetes mellitus without complication (HCC)   . Hyperlipidemia    and Dyslipidemia  . Hypertension   . Peripheral vascular disease (HCC)    with claludication  . PVD (peripheral vascular disease) (HCC)     Social History Social History   Tobacco Use  . Smoking status: Current Some Day Smoker    Packs/day: 0.20    Types: Cigarettes  . Smokeless tobacco: Never Used  . Tobacco comment: pt states he is trying, only smokes about 2-3 per day  Substance Use Topics  . Alcohol use: No    Comment: former  . Drug use: No    Family History Family History  Problem Relation Age of Onset  . Hypertension Mother   . Heart disease Father [id number]       CAD  .  Hypertension Father   . Hypertension Sister   . Hypertension Brother     Past Surgical History:  Procedure Laterality Date  . CATARACT EXTRACTION  June 2013   Cataract Right eye  . CORONARY ARTERY BYPASS GRAFT     LIMA to LAD, SVG to PDA, SVG to OM1/OM2  . Femoral-Femoral BPGA  05/29/1999   Left profundoplasty  and Left Fem/Pop 05/28/2008    No Known Allergies  Current Outpatient Medications  Medication Sig Dispense Refill  . amLODipine (NORVASC) 10 MG tablet Take 10 mg by mouth daily.    Marland Kitchen. aspirin 81 MG tablet Take 81 mg by mouth every evening.     Marland Kitchen. atorvastatin (LIPITOR) 40 MG tablet Take 40 mg by mouth daily.     . carvedilol (COREG) 12.5 MG tablet Take 1 tablet (12.5 mg total) by mouth 2 (two) times daily with a meal. 60 tablet 3  . dorzolamide-timolol (COSOPT) 22.3-6.8 MG/ML ophthalmic solution Place 1 drop into both eyes 2 (two) times daily.  4  . L-Methylfolate-Algae-B12-B6 (METANX PO) Take 1 capsule by mouth 2 (two) times daily.    Marland Kitchen. latanoprost (XALATAN) 0.005 % ophthalmic solution Place 1 drop into both eyes at bedtime.  4  . lisinopril (PRINIVIL,ZESTRIL) 20 MG tablet Take 1 tablet (20 mg total) by mouth daily. 30 tablet 3  . omeprazole (PRILOSEC)  40 MG capsule Take 40 mg by mouth daily. For 14 days  0  . pentoxifylline (TRENTAL) 400 MG CR tablet Take 400 mg by mouth 2 (two) times daily.  5  . polyethylene glycol (MIRALAX / GLYCOLAX) packet Take daily until having regular bowel movements 100 each 0  . tamsulosin (FLOMAX) 0.4 MG CAPS capsule Take 0.8 mg by mouth daily. Reported on 07/10/2015    . terbinafine (LAMISIL) 250 MG tablet Take 250 mg by mouth daily.     . Vitamin D, Ergocalciferol, (DRISDOL) 50000 units CAPS capsule Take 50,000 Units by mouth every Sunday.  0   No current facility-administered medications for this visit.     ROS: See HPI for pertinent positives and negatives.   Physical Examination  Vitals:   10/05/18 1201 10/05/18 1207  BP: (!) 189/89 (!)  189/85  Pulse: (!) 54 (!) 54  Temp: 98.1 F (36.7 C)   TempSrc: Temporal   SpO2: 99%   Weight: 143 lb 11.2 oz (65.2 kg)   Height: 5\' 6"  (1.676 m)    Body mass index is 23.19 kg/m.  General: A&O x 3, WDWN, slender male. Gait: slight limp HENT: No gross abnormalities.  Eyes: PERRLA. Pulmonary: Respirations are non labored, fair air movement in all fields, no rales, rhonchi, or wheezes.  Cardiac: regular rhythm, bradycardic (on a beta blocker), no detected murmur.        Carotid Bruits Right Left   Negative Negative   Radial pulses are 1+ palpable bilaterally   Adominal aortic pulse is not palpable                 VASCULAR EXAM: Extremities without ischemic changes, without Gangrene; without open wounds. Fem-fem bypass graft is palpable, but pulse is not palpable                                                                                                             LE Pulses Right Left       FEMORAL  1+ palpable  1+ palpable        POPLITEAL  not palpable   not palpable       POSTERIOR TIBIAL  not palpable   not palpable        DORSALIS PEDIS      ANTERIOR TIBIAL not palpable  not palpable    Abdomen: soft, NT, no palpable masses. Skin: no rashes, no cellulitis, no ulcers noted. Musculoskeletal: no muscle wasting or atrophy.  Neurologic: A&O X 3; appropriate affect, Sensation is normal; MOTOR FUNCTION:  moving all extremities equally, motor strength 5/5 in UE's, 3-4/5 in LE's. Speech is fluent/normal. CN 2-12 intact. Psychiatric: Thought content is normal, mood appropriate for clinical situation.    ASSESSMENT: Cristian Hayden is a 78 y.o. male with stable left calf claudication, claudicates after walking 1 block, relieved by rest.  His left lower leg has remained cold and numb most of the time for about a year, has not worsened.   There are no wounds nor gangrene in his  lower extremities.   ABI;s: right is stable with moderate disease and monophasic  waveforms. Decline in left to severe disease, was moderate, monophasic waveforms. See Plan.   He is hypertensive with occassional dyspnea and occasional left shoulder pain. I advised him to call Dr. Jenene SlickerHochrein's office this afternoon re that, I gave him Dr. Jenene SlickerHochrein's office phone number.   DATA  ABI (Date: 10/05/2018): ABI Findings: +---------+------------------+-----+----------+--------+ Right    Rt Pressure (mmHg)IndexWaveform  Comment  +---------+------------------+-----+----------+--------+ Brachial 219                                       +---------+------------------+-----+----------+--------+ ATA      134               0.61 monophasic         +---------+------------------+-----+----------+--------+ PTA      83                0.38 monophasic         +---------+------------------+-----+----------+--------+ Great Toe151               0.69                    +---------+------------------+-----+----------+--------+  +---------+------------------+-----+----------+-------+ Left     Lt Pressure (mmHg)IndexWaveform  Comment +---------+------------------+-----+----------+-------+ Brachial 212                                      +---------+------------------+-----+----------+-------+ ATA      106               0.48 monophasic        +---------+------------------+-----+----------+-------+ PTA      98                0.45 monophasic        +---------+------------------+-----+----------+-------+ Great Toe73                0.33                   +---------+------------------+-----+----------+-------+  +-------+-----------+-----------+------------+------------+ ABI/TBIToday's ABIToday's TBIPrevious ABIPrevious TBI +-------+-----------+-----------+------------+------------+ Right  0.61       0.69       0.58        0.64         +-------+-----------+-----------+------------+------------+ Left   0.48       0.33        0.65        0.42         +-------+-----------+-----------+------------+------------+ Right ABIs and TBIs appear essentially unchanged. Left ABIs and TBIs appear decreased compared to prior study on 06/13/17.  Summary: Right: Resting right ankle-brachial index indicates moderate right lower extremity arterial disease. The right toe-brachial index is abnormal. RT great toe pressure = 151 mmHg. Left: Resting left ankle-brachial index indicates severe left lower extremity arterial disease. The left toe-brachial index is abnormal. LT Great toe pressure = 73 mmHg.   PLAN:  Based on the patient's vascular studies and examination, pt will return to clinic in 2-4 weeks to discuss with Dr. Edilia Boickson cold and numb feeling in left calf in the last year, decline in left ABI to 0.48.   Resume 81 mg daily ASA, if no contraindications.   I discussed in depth with the patient the nature of atherosclerosis, and emphasized the importance of maximal medical management including  strict control of blood pressure, blood glucose, and lipid levels, obtaining regular exercise, and cessation of smoking.  The patient is aware that without maximal medical management the underlying atherosclerotic disease process will progress, limiting the benefit of any interventions.  The patient was given information about PAD including signs, symptoms, treatment, what symptoms should prompt the patient to seek immediate medical care, and risk reduction measures to take.  Clemon Chambers, RN, MSN, FNP-C [facility] Phone: [phone]  Clinic MD: Scot Dock  10/05/18 12:31 PM

## 2018-10-26 ENCOUNTER — Other Ambulatory Visit: Payer: Self-pay

## 2018-10-26 ENCOUNTER — Ambulatory Visit (INDEPENDENT_AMBULATORY_CARE_PROVIDER_SITE_OTHER): Payer: Medicare Other | Admitting: Vascular Surgery

## 2018-10-26 ENCOUNTER — Encounter: Payer: Self-pay | Admitting: Vascular Surgery

## 2018-10-26 VITALS — BP 181/83 | HR 58 | Temp 97.4°F | Resp 20 | Ht 66.0 in | Wt 142.0 lb

## 2018-10-26 DIAGNOSIS — I70213 Atherosclerosis of native arteries of extremities with intermittent claudication, bilateral legs: Secondary | ICD-10-CM | POA: Diagnosis not present

## 2018-10-26 NOTE — Progress Notes (Signed)
Virtual Visit via Telephone Note   This visit type was conducted due to national recommendations for restrictions regarding the COVID-19 Pandemic (e.g. social distancing) in an effort to limit this patient's exposure and mitigate transmission in our community.  Due to his co-morbid illnesses, this patient is at least at moderate risk for complications without adequate follow up.  This format is felt to be most appropriate for this patient at this time.  The patient did not have access to video technology/had technical difficulties with video requiring transitioning to audio format only (telephone).  All issues noted in this document were discussed and addressed.  No physical exam could be performed with this format.  Please refer to the patient's chart for his  consent [facility].   Date:  10/27/2018   ID:  Cristian Hayden, DOB 1941-02-05, MRN [id number]  Patient Location: Home Provider Location: Home  PCP:  No primary care provider on file.  Cardiologist:  Rollene RotundaJames Latravion Graves, MD  Electrophysiologist:  None   Evaluation Performed:  New Patient Evaluation  Chief Complaint:  Chest pain  History of Present Illness:    Cristian SimmerRobert J Hayden is a 78 y.o. male who presents for  follow up of coronary artery disease with CABG in 2003.  He also has peripheral vascular disease status post lower extremity bypass.  In Sept 2015 he did have a stress perfusion study that was negative for any evidence of ischemia.    I have not seen him since 2016.    He has had some elevated blood pressures and so was sent back here.  I see that yesterday when he was seen in vascular surgery for lower extremity disease that is being managed medically his blood pressure was 181/83.  Other times is been 189/85.  He has been doing relatively well.  Sounds like he is limited by vision and his claudication.  Does not leave the house very much.  He has had some sporadic left-sided chest discomfort.  He said this is  been going on for about a month.  It goes away quickly.  Sharp.  It goes down to his stomach and seems to happen more when he is constipated.  He does not recall any symptoms prior to his bypass.  He does not have associated nausea vomiting or diaphoresis.  He does not have arm or neck pain.  He does not have shortness of breath, PND or orthopnea.  He has no weight gain or edema.  The patient does not have symptoms concerning for COVID-19 infection (fever, chills, cough, or new shortness of breath).    Past Medical History:  Diagnosis Date  . Arthritis   . Bronchitis   . CAD (coronary artery disease)   . Diabetes mellitus without complication (HCC)   . Hyperlipidemia   . Hypertension   . Peripheral vascular disease (HCC)    with claludication  . PVD (peripheral vascular disease) (HCC)    Past Surgical History:  Procedure Laterality Date  . CATARACT EXTRACTION  June 2013   Cataract Right eye  . CORONARY ARTERY BYPASS GRAFT     LIMA to LAD, SVG to PDA, SVG to OM1/OM2  . Femoral-Femoral BPGA  05/29/1999   Left profundoplasty  and Left Fem/Pop 05/28/2008     Current Meds  Medication Sig  . ALPHAGAN P 0.1 % SOLN INT 1 GTT LEY BID  . amLODipine (NORVASC) 10 MG tablet Take 10 mg by mouth daily.  Marland Kitchen. aspirin 81 MG  tablet Take 81 mg by mouth every evening.   Marland Kitchen. atorvastatin (LIPITOR) 40 MG tablet Take 40 mg by mouth daily.   . carvedilol (COREG) 12.5 MG tablet Take 1 tablet (12.5 mg total) by mouth 2 (two) times daily with a meal.  . dorzolamide-timolol (COSOPT) 22.3-6.8 MG/ML ophthalmic solution Place 1 drop into both eyes 2 (two) times daily.  Marland Kitchen. L-Methylfolate-Algae-B12-B6 (METANX PO) Take 1 capsule by mouth 2 (two) times daily.  Marland Kitchen. latanoprost (XALATAN) 0.005 % ophthalmic solution Place 1 drop into both eyes at bedtime.  Marland Kitchen. LINZESS 290 MCG CAPS capsule TK 1 C PO QD ON AN EMPTY STOMACH AT LEAST 30 MINUTES BEFORE 1ST MEAL OF THE DAY  . lisinopril (ZESTRIL) 20 MG tablet Take 1 tablet (20 mg  total) by mouth 2 (two) times a day.  . metFORMIN (GLUCOPHAGE) 500 MG tablet TK 1 T PO  BID WITH MORNING AND EVENING MEALS  . omeprazole (PRILOSEC) 40 MG capsule Take 40 mg by mouth daily. For 14 days  . pentoxifylline (TRENTAL) 400 MG CR tablet Take 400 mg by mouth 2 (two) times daily.  . polyethylene glycol (MIRALAX / GLYCOLAX) packet Take daily until having regular bowel movements  . tamsulosin (FLOMAX) 0.4 MG CAPS capsule Take 0.8 mg by mouth daily. Reported on 07/10/2015  . terbinafine (LAMISIL) 250 MG tablet Take 250 mg by mouth daily.   . Vitamin D, Ergocalciferol, (DRISDOL) 50000 units CAPS capsule Take 50,000 Units by mouth every Sunday.  . [DISCONTINUED] lisinopril (PRINIVIL,ZESTRIL) 20 MG tablet Take 1 tablet (20 mg total) by mouth daily.     Allergies:   Patient has no known allergies.   Social History   Tobacco Use  . Smoking status: Current Some Day Smoker    Packs/day: 0.20    Types: Cigarettes  . Smokeless tobacco: Never Used  . Tobacco comment: pt states he is trying, only smokes about 2-3 per day  Substance Use Topics  . Alcohol use: No    Comment: former  . Drug use: No     Family Hx: The patient's family history includes Heart disease (age of onset: 5988) in his father; Hypertension in his brother, father, mother, and sister.  ROS:   Please see the history of present illness.    As stated in the HPI and negative for all other systems.   Prior CV studies:   The following studies were reviewed today:  None  Labs/Other Tests and Data Reviewed:    EKG:  No ECG reviewed.  Recent Labs: No results found for requested labs within last 8760 hours.   Recent Lipid Panel Lab Results  Component Value Date/Time   CHOL 106 03/02/2013 07:40 AM   TRIG 93.0 03/02/2013 07:40 AM   HDL 34.80 (L) 03/02/2013 07:40 AM   CHOLHDL 3 03/02/2013 07:40 AM   LDLCALC 53 03/02/2013 07:40 AM    Wt Readings from Last 3 Encounters:  10/27/18 142 lb (64.4 kg)  10/26/18 142 lb  (64.4 kg)  10/05/18 143 lb 11.2 oz (65.2 kg)     Objective:    Vital Signs:  Ht 5\' 4"  (1.626 m)   Wt 142 lb (64.4 kg)   BMI 24.37 kg/m    VITAL SIGNS:  reviewed  (Reviewed from yesterday's office visit)  ASSESSMENT & PLAN:    CAD - The patient does have some chest discomfort.  It somewhat atypical but I am going to have a low threshold for stress perfusion testing when I see him next.  I am going to adjust his medicines as below and see him in a couple of weeks.  He should present to the emergency room with any unstable symptoms.   Tobacco abuse - He is rarely smoking cigarettes and he knows he needs to quit altogether.   HTN - The blood pressure is not at target.  I am getting increase his lisinopril to 20 mg twice daily.  I will call in a new prescription.  When he comes back we will check blood work to include fasting lipid profile, being that, A1c.  He will likely need further medication adjustment.   HYPERLIPIDEMIA - As above he is instructed to come back fasting for a lipid profile.    BRADYCARDIA - He is not having any symptoms related to this.  No change in therapy.  DM: Check an A1c when he presents for follow-up.  COVID-19 Education: The signs and symptoms of COVID-19 were discussed with the patient and how to seek care for testing (follow up with PCP or arrange E-visit).  The importance of social distancing was discussed today.  Time:   Today, I have spent 25 minutes with the patient with telehealth technology discussing the above problems.     Medication Adjustments/Labs and Tests Ordered: Current medicines are reviewed at length with the patient today.  Concerns regarding medicines are outlined above.   Tests Ordered: Orders Placed This Encounter  Procedures  . CBC with Differential/Platelet  . Lipid panel  . Comprehensive metabolic panel  . HgB A1c    Medication Changes: Meds ordered this encounter  Medications  . lisinopril (ZESTRIL) 20 MG tablet     Sig: Take 1 tablet (20 mg total) by mouth 2 (two) times a day.    Dispense:  180 tablet    Refill:  3    Follow Up:  In Person in two weeks  Signed, Minus Breeding, MD  10/27/2018 3:06 PM    [facility]

## 2018-10-26 NOTE — Progress Notes (Signed)
Patient name: Cristian Hayden MRN: [id number] DOB: 1940/09/19 Sex: male  REASON FOR VISIT:   Follow-up of peripheral vascular disease.  HPI:   Cristian Hayden is a pleasant 78 y.o. male who I last saw on 07/06/2014. He has undergone previous right to left femorofemoral bypass graft by Dr. Amedeo Plenty in 2010. The graft is chronically occluded.  He was seen in our office by the nurse practitioner on 10/05/2018.  At that time ABI on the right was 61% and ABI on the left was 48%.  On my history, the patient notes that he has had bilateral calf claudication for about a year.  The symptoms have been stable over the last year.  His symptoms are more significant on the left side.  The symptoms are limited to his calf.  He does not have significant thigh or hip claudication.  He denies any history of rest pain or nonhealing ulcers.  He does describe some occasional chest pain which he has had since January.  This has been stable.  He tells me that he does not have a cardiologist.  He did undergo coronary revascularization in the past.  His risk factors for peripheral vascular disease include hypertension, hypercholesterolemia, and a smoking history.  He continues to smoke about 6 cigarettes a day.  He denies any history of diabetes or family history of premature cardiovascular disease.  Past Medical History:  Diagnosis Date  . Arthritis   . Bronchitis   . CAD (coronary artery disease)   . Diabetes mellitus without complication ([place])   . Hyperlipidemia    and Dyslipidemia  . Hypertension   . Peripheral vascular disease ([place])    with claludication  . PVD (peripheral vascular disease) ([facility])     Family History  Problem Relation Age of Onset  . Hypertension Mother   . Heart disease Father 72       CAD  . Hypertension Father   . Hypertension Sister   . Hypertension Brother     SOCIAL HISTORY: Social History   Tobacco Use  . Smoking status: Current Some Day Smoker    Packs/day: 0.20    Types:  Cigarettes  . Smokeless tobacco: Never Used  . Tobacco comment: pt states he is trying, only smokes about 2-3 per day  Substance Use Topics  . Alcohol use: No    Comment: former    No Known Allergies  Current Outpatient Medications  Medication Sig Dispense Refill  . ALPHAGAN P 0.1 % SOLN INT 1 GTT LEY BID    . amLODipine (NORVASC) 10 MG tablet Take 10 mg by mouth daily.    Marland Kitchen aspirin 81 MG tablet Take 81 mg by mouth every evening.     Marland Kitchen atorvastatin (LIPITOR) 40 MG tablet Take 40 mg by mouth daily.     . carvedilol (COREG) 12.5 MG tablet Take 1 tablet (12.5 mg total) by mouth 2 (two) times daily with a meal. 60 tablet 3  . dorzolamide-timolol (COSOPT) 22.3-6.8 MG/ML ophthalmic solution Place 1 drop into both eyes 2 (two) times daily.  4  . L-Methylfolate-Algae-B12-B6 (METANX PO) Take 1 capsule by mouth 2 (two) times daily.    Marland Kitchen latanoprost (XALATAN) 0.005 % ophthalmic solution Place 1 drop into both eyes at bedtime.  4  . LINZESS 290 MCG CAPS capsule TK 1 C PO QD ON AN EMPTY STOMACH AT LEAST 30 MINUTES BEFORE 1ST MEAL OF THE DAY    . lisinopril (PRINIVIL,ZESTRIL) 20 MG tablet Take 1 tablet (  20 mg total) by mouth daily. 30 tablet 3  . metFORMIN (GLUCOPHAGE) 500 MG tablet TK 1 T PO  BID WITH MORNING AND EVENING MEALS    . pentoxifylline (TRENTAL) 400 MG CR tablet Take 400 mg by mouth 2 (two) times daily.  5  . polyethylene glycol (MIRALAX / GLYCOLAX) packet Take daily until having regular bowel movements 100 each 0  . tamsulosin (FLOMAX) 0.4 MG CAPS capsule Take 0.8 mg by mouth daily. Reported on 07/10/2015    . terbinafine (LAMISIL) 250 MG tablet Take 250 mg by mouth daily.     . Vitamin D, Ergocalciferol, (DRISDOL) 50000 units CAPS capsule Take 50,000 Units by mouth every Sunday.  0  . omeprazole (PRILOSEC) 40 MG capsule Take 40 mg by mouth daily. For 14 days  0   No current facility-administered medications for this visit.     REVIEW OF SYSTEMS:  [X]  denotes positive finding, [ ]   denotes negative finding Cardiac  Comments:  Chest pain or chest pressure: x   Shortness of breath upon exertion:    Short of breath when lying flat:    Irregular heart rhythm:        Vascular    Pain in calf, thigh, or hip brought on by ambulation: x  bilateral calf  Pain in feet at night that wakes you up from your sleep:     Blood clot in your veins:    Leg swelling:         Pulmonary    Oxygen at home:    Productive cough:     Wheezing:         Neurologic    Sudden weakness in arms or legs:     Sudden numbness in arms or legs:     Sudden onset of difficulty speaking or slurred speech:    Temporary loss of vision in one eye:     Problems with dizziness:         Gastrointestinal    Blood in stool:     Vomited blood:         Genitourinary    Burning when urinating:     Blood in urine:        Psychiatric    Major depression:         Hematologic    Bleeding problems:    Problems with blood clotting too easily:        Skin    Rashes or ulcers:        Constitutional    Fever or chills:     PHYSICAL EXAM:   Vitals:   10/26/18 1400  BP: (!) 181/83  Pulse: (!) 58  Resp: 20  Temp: (!) 97.4 F (36.3 C)  TempSrc: Temporal  SpO2: 99%  Weight: 142 lb (64.4 kg)  Height: 5\' 6"  (1.676 m)    GENERAL: The patient is a well-nourished male, in no acute distress. The vital signs are documented above. CARDIAC: There is a regular rate and rhythm.  VASCULAR: I do not detect carotid bruits. He has a right femoral pulse.  I cannot palpate a left femoral pulse. I cannot palpate pedal pulses. Both feet appear adequately perfused. He has no significant lower extremity swelling. PULMONARY: There is good air exchange bilaterally without wheezing or rales. ABDOMEN: Soft and non-tender with normal pitched bowel sounds.  MUSCULOSKELETAL: There are no major deformities or cyanosis. NEUROLOGIC: No focal weakness or paresthesias are detected. SKIN: There are no ulcers or rashes  noted.  PSYCHIATRIC: The patient has a normal affect.  DATA:    ARTERIAL DOPPLER: I have independently interpreted his arterial Doppler study.  On the left side, which is the more symptomatic side, he has a monophasic dorsalis pedis and posterior tibial signal.  ABI is 48%.  Toe pressure is 73 mmHg.  On the right side he has a monophasic dorsalis pedis and posterior tibial signal.  ABI is 61%.  Toe pressures 151 mmHg.  MEDICAL ISSUES:   MULTILEVEL ARTERIAL OCCLUSIVE DISEASE: This patient has stable bilateral lower extremity claudication.  His symptoms are more significant on the left side.  On the left side he has an occlusion of the iliac system and an occluded right to left femorofemoral bypass.  He likely has underlying infrainguinal arterial occlusive disease bilaterally.  Given that his symptoms are stable I explained that we do not necessarily have to proceed with arteriography.  I have encouraged him to quit smoking.  I encouraged him to walk as much as possible to develop collaterals.  If his symptoms progress then certainly I think we could consider arteriography.  We would stick the right side and also study his arch in case we need to consider axillofemoral bypass grafting given his age.  However currently his symptoms are stable so we will hold off on an aggressive approach.  I have ordered follow-up ABIs in 6 months and I will see him back at that time.  He knows to call sooner if he has problems.  CHEST PAIN: This patient has had some stable chest pain.  I have encouraged him to follow-up with his primary care physician as he may need cardiac evaluation given his history of previous coronary revascularization.   HYPERTENSION: The patient's initial blood pressure today was elevated. We repeated this and this was still elevated. We have encouraged the patient to follow up with their primary care physician for management of their blood pressure.   Waverly Ferrarihristopher Siarra Gilkerson [facility] [phone]

## 2018-10-27 ENCOUNTER — Telehealth (INDEPENDENT_AMBULATORY_CARE_PROVIDER_SITE_OTHER): Payer: Medicare Other | Admitting: Cardiology

## 2018-10-27 ENCOUNTER — Encounter: Payer: Self-pay | Admitting: Cardiology

## 2018-10-27 ENCOUNTER — Telehealth: Payer: Medicare Other | Admitting: Cardiology

## 2018-10-27 VITALS — Ht 64.0 in | Wt 142.0 lb

## 2018-10-27 DIAGNOSIS — I25118 Atherosclerotic heart disease of native coronary artery with other forms of angina pectoris: Secondary | ICD-10-CM

## 2018-10-27 DIAGNOSIS — I10 Essential (primary) hypertension: Secondary | ICD-10-CM | POA: Insufficient documentation

## 2018-10-27 DIAGNOSIS — I2510 Atherosclerotic heart disease of native coronary artery without angina pectoris: Secondary | ICD-10-CM | POA: Insufficient documentation

## 2018-10-27 DIAGNOSIS — E119 Type 2 diabetes mellitus without complications: Secondary | ICD-10-CM | POA: Insufficient documentation

## 2018-10-27 DIAGNOSIS — E139 Other specified diabetes mellitus without complications: Secondary | ICD-10-CM | POA: Diagnosis not present

## 2018-10-27 DIAGNOSIS — Z7189 Other specified counseling: Secondary | ICD-10-CM

## 2018-10-27 DIAGNOSIS — E7800 Pure hypercholesterolemia, unspecified: Secondary | ICD-10-CM

## 2018-10-27 MED ORDER — LISINOPRIL 20 MG PO TABS: 20.0000 mg | ORAL_TABLET | Freq: Two times a day (BID) | ORAL | 3 refills | Status: DC

## 2018-10-27 NOTE — Patient Instructions (Addendum)
  Medication Instructions:  INCREASE YOUR LISINOPRIL TO 20 MG TWICE A DAY   If you need a refill on your cardiac medications before your next appointment, please call your pharmacy.   Lab work: WILL GET FASTING LABS AT YOUR FOLLOW UP, DRINK ONLY WATER MORNING OF APPOINTMENT   If you have labs (blood work) drawn today and your tests are completely normal, you will receive your results only by: Marland Kitchen MyChart Message (if you have MyChart) OR . A paper copy in the mail If you have any lab test that is abnormal or we need to change your treatment, we will call you to review the results.  Testing/Procedures: NONE  Follow-Up: 11/17/18 AT 10:20 IN THE OFFICE WITH DR [facility]

## 2018-11-16 ENCOUNTER — Telehealth: Payer: Self-pay | Admitting: Cardiology

## 2018-11-16 NOTE — Telephone Encounter (Signed)

## 2018-11-16 NOTE — Progress Notes (Signed)
Cardiology Office Note   Date:  11/17/2018   ID:  Cristian Hayden, DOB [birth year], MRN [id number]  PCP:  Patient, No Pcp Per  Cardiologist:   Rollene RotundaJames Hubert Raatz, MD   No chief complaint on file.     History of Present Illness: Cristian Hayden is a 78 y.o. male who who presents for  follow up of coronary artery disease with CABG in 2003.  He also has peripheral vascular disease status post lower extremity bypass.  In Sept 2015 he did have a stress perfusion study that was negative for any evidence of ischemia.    He had a telehealth visit recently.  He had HTN at a vascular surgery appt.    At the recent visit I did increase the lisinopril.  Blood pressure is elevated today.  Unfortunately does not have any means of taking it at home.  Sounds like he is relatively sedentary.  He does do lots of walking and working at home.  He gets some atypical left axillary discomfort under his left breast that is sharp and sporadic.  He does not describe associated nausea vomiting or diaphoresis.  He does not have jaw or arm discomfort.  He does not have shortness of breath, PND or orthopnea.  He has had no weight gain or edema.   Past Medical History:  Diagnosis Date  . Arthritis   . Bronchitis   . CAD (coronary artery disease)   . Diabetes mellitus without complication ([facility])   . Hyperlipidemia   . Hypertension   . Peripheral vascular disease ([facility])    with claludication  . PVD (peripheral vascular disease) ([facility])     Past Surgical History:  Procedure Laterality Date  . CATARACT EXTRACTION  June 2013   Cataract Right eye  . CORONARY ARTERY BYPASS GRAFT     LIMA to LAD, SVG to PDA, SVG to OM1/OM2  . Femoral-Femoral BPGA  05/29/1999   Left profundoplasty  and Left Fem/Pop 05/28/2008     Current Outpatient Medications  Medication Sig Dispense Refill  . ALPHAGAN P 0.1 % SOLN INT 1 GTT LEY BID    . amLODipine (NORVASC) 10 MG tablet Take 10 mg by mouth daily.    Marland Kitchen. aspirin 81 MG tablet Take 81 mg  by mouth every evening.     Marland Kitchen. atorvastatin (LIPITOR) 40 MG tablet Take 40 mg by mouth daily.     . carvedilol (COREG) 12.5 MG tablet Take 1 tablet (12.5 mg total) by mouth 2 (two) times daily with a meal. 60 tablet 3  . dorzolamide-timolol (COSOPT) 22.3-6.8 MG/ML ophthalmic solution Place 1 drop into both eyes 2 (two) times daily.  4  . L-Methylfolate-Algae-B12-B6 (METANX PO) Take 1 capsule by mouth 2 (two) times daily.    Marland Kitchen. latanoprost (XALATAN) 0.005 % ophthalmic solution Place 1 drop into both eyes at bedtime.  4  . LINZESS 290 MCG CAPS capsule TK 1 C PO QD ON AN EMPTY STOMACH AT LEAST 30 MINUTES BEFORE 1ST MEAL OF THE DAY    . lisinopril (ZESTRIL) 20 MG tablet Take 1 tablet (20 mg total) by mouth 2 (two) times a day. 180 tablet 3  . metFORMIN (GLUCOPHAGE) 500 MG tablet TK 1 T PO  BID WITH MORNING AND EVENING MEALS    . pentoxifylline (TRENTAL) 400 MG CR tablet Take 400 mg by mouth 2 (two) times daily.  5  . polyethylene glycol (MIRALAX / GLYCOLAX) packet Take daily until having regular bowel movements 100 each  0  . tamsulosin (FLOMAX) 0.4 MG CAPS capsule Take 0.8 mg by mouth daily. Reported on 07/10/2015    . terbinafine (LAMISIL) 250 MG tablet Take 250 mg by mouth daily.     . Vitamin D, Ergocalciferol, (DRISDOL) 50000 units CAPS capsule Take 50,000 Units by mouth every Sunday.  0  . omeprazole (PRILOSEC) 40 MG capsule Take 40 mg by mouth daily. For 14 days  0   No current facility-administered medications for this visit.     Allergies:   Patient has no known allergies.    ROS:  Please see the history of present illness.   Otherwise, review of systems are positive for none.   All other systems are reviewed and negative.    PHYSICAL EXAM: VS:  BP (!) 172/79   Pulse 67   Ht 5\' 4"  (1.626 m)   Wt 143 lb 12.8 oz (65.2 kg)   SpO2 97%   BMI 24.68 kg/m  , BMI Body mass index is 24.68 kg/m. GENERAL:  Well appearing NECK:  No jugular venous distention, waveform within normal limits,  carotid upstroke brisk and symmetric, no bruits, no thyromegaly LUNGS:  Clear to auscultation bilaterally BACK:  No CVA tenderness CHEST:  Unremarkable HEART:  PMI not displaced or sustained,S1 and S2 within normal limits, no S3, no S4, no clicks, no rubs, no murmurs ABD:  Flat, positive bowel sounds normal in frequency in pitch, no bruits, no rebound, no guarding, no midline pulsatile mass, no hepatomegaly, no splenomegaly EXT:  2 plus pulses upper, absent dorsalis pedis and posterior tibialis bilateral, no edema, no cyanosis no clubbing   EKG:  EKG is ordered today. The ekg ordered today demonstrates sinus rhythm, rate 67, axis within normal limits, intervals within normal limits, no acute ST-T wave changes.   Recent Labs: No results found for requested labs within last 8760 hours.    Lipid Panel    Component Value Date/Time   CHOL 106 03/02/2013 0740   TRIG 93.0 03/02/2013 0740   HDL 34.80 (L) 03/02/2013 0740   CHOLHDL 3 03/02/2013 0740   VLDL 18.6 03/02/2013 0740   LDLCALC 53 03/02/2013 0740      Wt Readings from Last 3 Encounters:  11/17/18 143 lb 12.8 oz (65.2 kg)  10/27/18 142 lb (64.4 kg)  10/26/18 142 lb (64.4 kg)      Other studies Reviewed: Additional studies/ records that were reviewed today include: EKG. Review of the above records demonstrates:  Please see elsewhere in the note.     ASSESSMENT AND PLAN:  CAD - The patient has no new sypmtoms.  No further cardiovascular testing is indicated.  We will continue with aggressive risk reduction and meds as listed.  He does have some chest pain but is quite atypical.  At this point I will continue with aggressive risk reduction.  Tobacco abuse - He is rarely smoking cigarettes and I asked him to quit altogether.   HTN - I am going to add chlorthalidone 25 mg daily.  I thought about going up on his beta-blocker but he is a bradycardia in the past.  HYPERLIPIDEMIA - I will check a lipid profile.  DM:   Check an A1c.  Current medicines are reviewed at length with the patient today.  The patient does not have concerns regarding medicines.  The following changes have been made:  no change  Labs/ tests ordered today include:  No orders of the defined types were placed in this encounter.  Disposition:   FU with APP in 3 months   Signed, Minus Breeding, MD  11/17/2018 10:56 AM    [place]

## 2018-11-17 ENCOUNTER — Other Ambulatory Visit: Payer: Self-pay

## 2018-11-17 ENCOUNTER — Ambulatory Visit (INDEPENDENT_AMBULATORY_CARE_PROVIDER_SITE_OTHER): Payer: Medicare Other | Admitting: Cardiology

## 2018-11-17 ENCOUNTER — Encounter: Payer: Self-pay | Admitting: Cardiology

## 2018-11-17 VITALS — BP 172/79 | HR 67 | Ht 64.0 in | Wt 143.8 lb

## 2018-11-17 DIAGNOSIS — Z720 Tobacco use: Secondary | ICD-10-CM | POA: Diagnosis not present

## 2018-11-17 DIAGNOSIS — I25118 Atherosclerotic heart disease of native coronary artery with other forms of angina pectoris: Secondary | ICD-10-CM | POA: Diagnosis not present

## 2018-11-17 DIAGNOSIS — E7800 Pure hypercholesterolemia, unspecified: Secondary | ICD-10-CM

## 2018-11-17 DIAGNOSIS — I10 Essential (primary) hypertension: Secondary | ICD-10-CM | POA: Diagnosis not present

## 2018-11-17 DIAGNOSIS — E118 Type 2 diabetes mellitus with unspecified complications: Secondary | ICD-10-CM

## 2018-11-17 MED ORDER — CARVEDILOL 25 MG PO TABS: 25.0000 mg | ORAL_TABLET | Freq: Two times a day (BID) | ORAL | 3 refills | Status: DC

## 2018-11-17 NOTE — Patient Instructions (Addendum)
Medication Instructions:  INCREASE CARVEDILOL 25MG  TWICE DAILY  If you need a refill on your cardiac medications before your next appointment, please call your pharmacy.  Labwork: HAVE LABS DONE TODAY HERE IN OUR OFFICE AT [facility]    Take the provided lab slips with you to the lab for your blood draw.   When you have your labs (blood work) drawn today and your tests are completely normal, you will receive your results only by MyChart Message (if you have MyChart) -OR-  A paper copy in the mail.  If you have any lab test that is abnormal or we need to change your treatment, we will call you to review these results  FOLLOW UP You will need a follow up appointment in 3 monthsWITH WITH ONE OF THE PA's HERE IN OUR OFFICE.  You may see Minus Breeding, MD or one of the following Advanced Practice Providers on your designated Care Team:  Rosaria Ferries, PA-C  Jory Sims, DNP, ANP  At [facility], you and your health needs are our priority.  As part of our continuing mission to provide you with exceptional heart care, we have created designated Provider Care Teams.  These Care Teams include your primary Cardiologist (physician) and Advanced Practice Providers (APPs -  Physician Assistants and Nurse Practitioners) who all work together to provide you with the care you need, when you need it.  Thank you for choosing [facility]!!

## 2018-11-18 LAB — COMPREHENSIVE METABOLIC PANEL
ALT: 14 IU/L (ref 0–44)
AST: 15 IU/L (ref 0–40)
Albumin/Globulin Ratio: 2 (ref 1.2–2.2)
Albumin: 4.7 g/dL (ref 3.7–4.7)
Alkaline Phosphatase: 85 IU/L (ref 39–117)
BUN/Creatinine Ratio: 12 (ref 10–24)
BUN: 10 mg/dL (ref 8–27)
Bilirubin Total: 0.5 mg/dL (ref 0.0–1.2)
CO2: 25 mmol/L (ref 20–29)
Calcium: 9.4 mg/dL (ref 8.6–10.2)
Chloride: 102 mmol/L (ref 96–106)
Creatinine, Ser: 0.82 mg/dL (ref 0.76–1.27)
GFR calc Af Amer: 99 mL/min/{1.73_m2} (ref >59)
GFR calc non Af Amer: 85 mL/min/{1.73_m2} (ref >59)
Globulin, Total: 2.3 g/dL (ref 1.5–4.5)
Glucose: 93 mg/dL (ref 65–99)
Potassium: 4.6 mmol/L (ref 3.5–5.2)
Sodium: 146 mmol/L — ABNORMAL HIGH (ref 134–144)
Total Protein: 7 g/dL (ref 6.0–8.5)

## 2018-11-18 LAB — CBC WITH DIFFERENTIAL/PLATELET
Basophils Absolute: 0 10*3/uL (ref 0.0–0.2)
Basos: 0 %
EOS (ABSOLUTE): 0.1 10*3/uL (ref 0.0–0.4)
Eos: 2 %
Hematocrit: 40.2 % (ref 37.5–51.0)
Hemoglobin: 13.8 g/dL (ref 13.0–17.7)
Immature Grans (Abs): 0 10*3/uL (ref 0.0–0.1)
Immature Granulocytes: 0 %
Lymphocytes Absolute: 2.7 10*3/uL (ref 0.7–3.1)
Lymphs: 43 %
MCH: 30.3 pg (ref 26.6–33.0)
MCHC: 34.3 g/dL (ref 31.5–35.7)
MCV: 88 fL (ref 79–97)
Monocytes Absolute: 0.5 10*3/uL (ref 0.1–0.9)
Monocytes: 8 %
Neutrophils Absolute: 2.9 10*3/uL (ref 1.4–7.0)
Neutrophils: 47 %
Platelets: 177 10*3/uL (ref 150–450)
RBC: 4.55 x10E6/uL (ref 4.14–5.80)
RDW: 13.6 % (ref 11.6–15.4)
WBC: 6.2 10*3/uL (ref 3.4–10.8)

## 2018-11-18 LAB — LIPID PANEL
Chol/HDL Ratio: 2.6 ratio (ref 0.0–5.0)
Cholesterol, Total: 116 mg/dL (ref 100–199)
HDL: 44 mg/dL (ref >39)
LDL Calculated: 56 mg/dL (ref 0–99)
Triglycerides: 80 mg/dL (ref 0–149)
VLDL Cholesterol Cal: 16 mg/dL (ref 5–40)

## 2018-11-18 LAB — HEMOGLOBIN A1C
Est. average glucose Bld gHb Est-mCnc: 131 mg/dL
Hgb A1c MFr Bld: 6.2 % — ABNORMAL HIGH (ref 4.8–5.6)

## 2018-11-21 ENCOUNTER — Encounter: Payer: Self-pay | Admitting: *Deleted

## 2019-02-13 NOTE — Progress Notes (Signed)
Cardiology Office Note   Date:  02/15/2019   ID:  Cristian Hayden, DOB 1941/03/12, MRN [id number]  PCP:  Patient, No Pcp Per  Cardiologist: Dr.Hochrien  EQ:ASTMHD Up    History of Present Illness: Cristian Hayden is a 78 y.o. male who presents for ongoing assessment and management of coronary artery disease with history of CABG in 2003, (LIMA to LAD, SVG to PDA, SVG to OM1/OM2) PAD status post femorofemoral BP GA on 05/28/2008.,  Hypertension, hyperlipidemia, with other history to include arthritis, chronic bronchitis, and diabetes mellitus type 2.  He was last seen in the office by Dr. Percival Spanish on 11/17/2018.  At that time the patient's blood pressure was elevated.  He was found to be relatively sedentary and was not doing a lot of walking at his home.  Due to hypertension, he was started on chlorthalidone 25 mg daily.  Beta-blocker was not increased due to bradycardia.  Dr. Percival Spanish did not plan any further ischemic testing, and he was continued on secondary management and aggressive risk reduction.  Lipids and LFTs were checked at that office visit.  Cristian Hayden comes today hypertensive.  He has not been taking carvedilol, amlodipine, or chlorthalidone.  He states that he ran out and thought that he did not have to continue to take it as there were no refills.  He does have some mild chest pressure and shortness of breath with exertion however he is not very active.  He denies headaches, blurred vision, or dizziness.  He continues to have arthritis pain.  Past Medical History:  Diagnosis Date  . Arthritis   . Bronchitis   . CAD (coronary artery disease)   . Diabetes mellitus without complication ([place])   . Hyperlipidemia   . Hypertension   . Peripheral vascular disease ([place])    with claludication  . PVD (peripheral vascular disease) ([place])     Past Surgical History:  Procedure Laterality Date  . CATARACT EXTRACTION  June 2013   Cataract Right eye  . CORONARY ARTERY BYPASS GRAFT     LIMA  to LAD, SVG to PDA, SVG to OM1/OM2  . Femoral-Femoral BPGA  05/29/1999   Left profundoplasty  and Left Fem/Pop 05/28/2008     Current Outpatient Medications  Medication Sig Dispense Refill  . ALPHAGAN P 0.1 % SOLN INT 1 GTT LEY BID    . atorvastatin (LIPITOR) 40 MG tablet Take 40 mg by mouth daily.     Marland Kitchen dicyclomine (BENTYL) 10 MG capsule Take 1 capsule by mouth 3 (three) times daily.    . dorzolamide-timolol (COSOPT) 22.3-6.8 MG/ML ophthalmic solution Place 1 drop into both eyes 2 (two) times daily.  4  . latanoprost (XALATAN) 0.005 % ophthalmic solution Place 1 drop into both eyes at bedtime.  4  . LINZESS 290 MCG CAPS capsule TK 1 C PO QD ON AN EMPTY STOMACH AT LEAST 30 MINUTES BEFORE 1ST MEAL OF THE DAY    . lisinopril (ZESTRIL) 20 MG tablet Take 1 tablet (20 mg total) by mouth 2 (two) times a day. 180 tablet 3  . metFORMIN (GLUCOPHAGE) 500 MG tablet TK 1 T PO  BID WITH MORNING AND EVENING MEALS    . omeprazole (PRILOSEC) 40 MG capsule Take 40 mg by mouth daily. For 14 days  0  . pentoxifylline (TRENTAL) 400 MG CR tablet Take 400 mg by mouth 2 (two) times daily.  5  . polyethylene glycol (MIRALAX / GLYCOLAX) packet Take daily until having regular bowel movements  100 each 0  . tamsulosin (FLOMAX) 0.4 MG CAPS capsule Take 0.8 mg by mouth daily. Reported on 07/10/2015    . carvedilol (COREG) 6.25 MG tablet Take 1 tablet (6.25 mg total) by mouth 2 (two) times daily with a meal. 90 tablet 3  . chlorthalidone (HYGROTON) 25 MG tablet Take 1 tablet (25 mg total) by mouth daily. 90 tablet 3   No current facility-administered medications for this visit.     Allergies:   Patient has no known allergies.    Social History:  The patient  reports that he has been smoking cigarettes. He has been smoking about 0.20 packs per day. He has never used smokeless tobacco. He reports that he does not drink alcohol or use drugs.   Family History:  The patient's family history includes Heart disease (age of  onset: 18) in his father; Hypertension in his brother, father, mother, and sister.    ROS: All other systems are reviewed and negative. Unless otherwise mentioned in H&P    PHYSICAL EXAM: VS:  BP (!) 176/73   Pulse (!) 55   Ht 5\' 4"  (1.626 m)   Wt 140 lb 9.6 oz (63.8 kg)   SpO2 99%   BMI 24.13 kg/m  , BMI Body mass index is 24.13 kg/m. GEN: Well nourished, well developed, in no acute distress HEENT: normal Neck: no JVD, carotid bruits, or masses Cardiac: RRR; no murmurs, rubs, or gallops,no edema  Respiratory:  Clear to auscultation bilaterally, normal work of breathing GI: soft, nontender, nondistended, + BS MS: no deformity or atrophy Skin: warm and dry, no rash Neuro:  Strength and sensation are intact Psych: euthymic mood, full affect   EKG: Sinus bradycardia heart rate of 55 bpm with frequent PVCs.  Mild LVH.  (Change from prior EKG which revealed normal sinus rhythm).  Recent Labs: 11/17/2018: ALT 14; BUN 10; Creatinine, Ser 0.82; Hemoglobin 13.8; Platelets 177; Potassium 4.6; Sodium 146    Lipid Panel    Component Value Date/Time   CHOL 116 11/17/2018 1125   TRIG 80 11/17/2018 1125   HDL 44 11/17/2018 1125   CHOLHDL 2.6 11/17/2018 1125   CHOLHDL 3 03/02/2013 0740   VLDL 18.6 03/02/2013 0740   LDLCALC 56 11/17/2018 1125      Wt Readings from Last 3 Encounters:  02/15/19 140 lb 9.6 oz (63.8 kg)  11/17/18 143 lb 12.8 oz (65.2 kg)  10/27/18 142 lb (64.4 kg)      Other studies Reviewed: None  ASSESSMENT AND PLAN:  1.  Hypertension: Blood pressures not controlled today.  He has not taken carvedilol, amlodipine, or chlorthalidone.  I am going to restart his carvedilol only at a lower dose at 6.25 mg twice daily.  He is to restart chlorthalidone 25 mg daily.  I will recheck an echocardiogram for changes in LV function.  See him back in a month for blood pressure check and follow-up.  2.  Coronary artery disease: History of coronary artery disease, CABG in  2003, he offers no complaints of chest pain at this time.  Aggressive secondary management is planned.  He will need to have much better blood pressure control.  Close follow-up for this with above changes.  3.  PAD: History of femorofemoral BPGA.  He continues to have some pain in his ankles related to arthritis but no significant edema or claudication symptoms are expressed.  4.  Hyperlipidemia: Continue atorvastatin 40 mg daily.  LDL 56  drawn 11/17/2018  with total cholesterol  of 116.  No changes in medication regimen.   Current medicines are reviewed at length with the patient today.    Labs/ tests ordered today include: Echocardiogram  Bettey MareKathryn M. Liborio NixonLawrence DNP, ANP, AACC   02/15/2019 9:59 AM    [facility] [address] Office [phone] Fax [phone]  Notice: This dictation was prepared with Dragon dictation along with smaller phrase technology. Any transcriptional errors that result from this process are unintentional and may not be corrected upon review.

## 2019-02-15 ENCOUNTER — Encounter: Payer: Self-pay | Admitting: Adult Health

## 2019-02-15 ENCOUNTER — Ambulatory Visit: Payer: Medicare Other | Admitting: Adult Health

## 2019-02-15 ENCOUNTER — Other Ambulatory Visit: Payer: Self-pay

## 2019-02-15 VITALS — BP 176/73 | HR 55 | Ht 64.0 in | Wt 140.6 lb

## 2019-02-15 DIAGNOSIS — I10 Essential (primary) hypertension: Secondary | ICD-10-CM | POA: Diagnosis not present

## 2019-02-15 DIAGNOSIS — I519 Heart disease, unspecified: Secondary | ICD-10-CM | POA: Diagnosis not present

## 2019-02-15 DIAGNOSIS — I7409 Other arterial embolism and thrombosis of abdominal aorta: Secondary | ICD-10-CM

## 2019-02-15 DIAGNOSIS — I2510 Atherosclerotic heart disease of native coronary artery without angina pectoris: Secondary | ICD-10-CM

## 2019-02-15 DIAGNOSIS — E782 Mixed hyperlipidemia: Secondary | ICD-10-CM

## 2019-02-15 MED ORDER — CARVEDILOL 6.25 MG PO TABS: 6.2500 mg | ORAL_TABLET | Freq: Two times a day (BID) | ORAL | 3 refills | Status: DC

## 2019-02-15 MED ORDER — CHLORTHALIDONE 25 MG PO TABS: 25.0000 mg | ORAL_TABLET | Freq: Every day | ORAL | 3 refills | Status: DC

## 2019-02-15 NOTE — Patient Instructions (Addendum)
Medication Instructions:  STOP- Amlodipine START- Chlorthalidone 25 mg by mouth daily DECREASE- Carvedilol 6.25 mg by mouth twice a day  If you need a refill on your cardiac medications before your next appointment, please call your pharmacy.  Labwork: None Ordered   Testing/Procedures: Your physician has requested that you have an echocardiogram. Echocardiography is a painless test that uses sound waves to create images of your heart. It provides your doctor with information about the size and shape of your heart and how well your heart's chambers and valves are working. This procedure takes approximately one hour. There are no restrictions for this procedure.   Follow-Up: IN 1 Month with Morton Amy  At [facility], you and your health needs are our priority.  As part of our continuing mission to provide you with exceptional heart care, we have created designated Provider Care Teams.  These Care Teams include your primary Cardiologist (physician) and Advanced Practice Providers (APPs -  Physician Assistants and Nurse Practitioners) who all work together to provide you with the care you need, when you need it.  Thank you for choosing [facility] at [facility]!!

## 2019-02-22 ENCOUNTER — Ambulatory Visit (HOSPITAL_COMMUNITY): Payer: Medicare Other | Attending: Cardiovascular Disease

## 2019-02-22 ENCOUNTER — Other Ambulatory Visit: Payer: Self-pay

## 2019-02-22 DIAGNOSIS — I739 Peripheral vascular disease, unspecified: Secondary | ICD-10-CM | POA: Insufficient documentation

## 2019-02-22 DIAGNOSIS — I358 Other nonrheumatic aortic valve disorders: Secondary | ICD-10-CM | POA: Diagnosis not present

## 2019-02-22 DIAGNOSIS — I2510 Atherosclerotic heart disease of native coronary artery without angina pectoris: Secondary | ICD-10-CM | POA: Insufficient documentation

## 2019-02-22 DIAGNOSIS — I519 Heart disease, unspecified: Secondary | ICD-10-CM

## 2019-03-20 NOTE — Progress Notes (Signed)
Cardiology Office Note   Date:  03/22/2019   ID:  Cristian Hayden, DOB 10-Jan-1941, MRN [id number]  PCP:  Patient, No Pcp Per  Cardiologist:  Antoine Poche  CC: Hypertension follow up   History of Present Illness: Cristian Hayden is a 78 y.o. male who presents for ongoing assessment and management of coronary artery disease with history of CABG in 2003, (LIMA to LAD, SVG to PDA, SVG to OM1/OM2) PAD status post femorofemoral BP GA on 05/28/2008.,  Hypertension, hyperlipidemia, with other history to include arthritis, chronic bronchitis, and diabetes mellitus type 2.  He was last seen in the office by Dr. Antoine Poche on 11/17/2018.  At that time the patient's blood pressure was elevated.  He was found to be relatively sedentary and was not doing a lot of walking at his home.  Due to hypertension, he was started on chlorthalidone 25 mg daily.  Beta-blocker was not increased due to bradycardia.  Dr. Antoine Poche did not plan any further ischemic testing, and he was continued on secondary management and aggressive risk reduction.   He was last seen in the office on 02/15/2019. He had not taken his medication as he had run out. He was complaining of chest pressure. I restarted carvedilol at 6.25 mg BID, chlorthalidone 25 mg, and ordered echocardiogram.   He comes today with better control blood pressure and states that he is taking his medications.  Unfortunately, the patient stopped taking chlorthalidone as he stated that it made his lips swell.  The patient does have some confusion over which medications he is taking in which he is not.  He did bring his medications with him except for the ones that he needed refills on.  He states he had run out and is going by to pick them up sometime today.  He does have his carvedilol lisinopril Metformin and atorvastatin with him today.  He has not been seen by primary care physician.  He states he goes to a clinic located on [address] but does not know the name of  the clinic or the name of the physician.   Past Medical History:  Diagnosis Date  . Arthritis   . Bronchitis   . CAD (coronary artery disease)   . Diabetes mellitus without complication (HCC)   . Hyperlipidemia   . Hypertension   . Peripheral vascular disease (HCC)    with claludication  . PVD (peripheral vascular disease) (HCC)     Past Surgical History:  Procedure Laterality Date  . CATARACT EXTRACTION  June 2013   Cataract Right eye  . CORONARY ARTERY BYPASS GRAFT     LIMA to LAD, SVG to PDA, SVG to OM1/OM2  . Femoral-Femoral BPGA  05/29/1999   Left profundoplasty  and Left Fem/Pop 05/28/2008     Current Outpatient Medications  Medication Sig Dispense Refill  . ALPHAGAN P 0.1 % SOLN INT 1 GTT LEY BID    . atorvastatin (LIPITOR) 40 MG tablet Take 40 mg by mouth daily.     . carvedilol (COREG) 6.25 MG tablet Take 1 tablet (6.25 mg total) by mouth 2 (two) times daily with a meal. 90 tablet 3  . chlorthalidone (HYGROTON) 25 MG tablet Take 1 tablet (25 mg total) by mouth daily. 90 tablet 3  . dicyclomine (BENTYL) 10 MG capsule Take 1 capsule by mouth 3 (three) times daily.    . dorzolamide-timolol (COSOPT) 22.3-6.8 MG/ML ophthalmic solution Place 1 drop into both eyes 2 (two) times daily.  4  .  latanoprost (XALATAN) 0.005 % ophthalmic solution Place 1 drop into both eyes at bedtime.  4  . LINZESS 290 MCG CAPS capsule TK 1 C PO QD ON AN EMPTY STOMACH AT LEAST 30 MINUTES BEFORE 1ST MEAL OF THE DAY    . lisinopril (ZESTRIL) 20 MG tablet Take 1 tablet (20 mg total) by mouth 2 (two) times a day. 180 tablet 3  . metFORMIN (GLUCOPHAGE) 500 MG tablet TK 1 T PO  BID WITH MORNING AND EVENING MEALS    . pentoxifylline (TRENTAL) 400 MG CR tablet Take 400 mg by mouth 2 (two) times daily.  5  . polyethylene glycol (MIRALAX / GLYCOLAX) packet Take daily until having regular bowel movements 100 each 0  . tamsulosin (FLOMAX) 0.4 MG CAPS capsule Take 0.8 mg by mouth daily. Reported on 07/10/2015     . omeprazole (PRILOSEC) 40 MG capsule Take 40 mg by mouth daily. For 14 days  0   No current facility-administered medications for this visit.     Allergies:   Patient has no known allergies.    Social History:  The patient  reports that he has been smoking cigarettes. He has been smoking about 0.20 packs per day. He has never used smokeless tobacco. He reports that he does not drink alcohol or use drugs.   Family History:  The patient's family history includes Heart disease (age of onset: 11) in his father; Hypertension in his brother, father, mother, and sister.    ROS: All other systems are reviewed and negative. Unless otherwise mentioned in H&P    PHYSICAL EXAM: VS:  BP (!) 146/84   Pulse (!) 56   Temp 97.9 F (36.6 C)   Ht 5\' 4"  (1.626 m)   Wt 136 lb 6.4 oz (61.9 kg)   SpO2 96%   BMI 23.41 kg/m  , BMI Body mass index is 23.41 kg/m. GEN: Well nourished, well developed, in no acute distress HEENT: normal Neck: no JVD, carotid bruits, or masses Cardiac: RRR; no murmurs, rubs, or gallops,no edema  Respiratory:  Clear to auscultation bilaterally, normal work of breathing GI: soft, nontender, nondistended, + BS MS: no deformity or atrophy Skin: warm and dry, no rash Neuro:  Strength and sensation are intact Psych: euthymic mood, full affect   EKG: Not completed.   Recent Labs: 11/17/2018: ALT 14; BUN 10; Creatinine, Ser 0.82; Hemoglobin 13.8; Platelets 177; Potassium 4.6; Sodium 146    Lipid Panel    Component Value Date/Time   CHOL 116 11/17/2018 1125   TRIG 80 11/17/2018 1125   HDL 44 11/17/2018 1125   CHOLHDL 2.6 11/17/2018 1125   CHOLHDL 3 03/02/2013 0740   VLDL 18.6 03/02/2013 0740   LDLCALC 56 11/17/2018 1125      Wt Readings from Last 3 Encounters:  03/22/19 136 lb 6.4 oz (61.9 kg)  02/15/19 140 lb 9.6 oz (63.8 kg)  11/17/18 143 lb 12.8 oz (65.2 kg)     Other studies Reviewed:  Echocardiogram 02/22/2019  1. Left ventricular ejection fraction,  by visual estimation, is 55 to 60%. The left ventricle has normal function. There is mildly increased left ventricular hypertrophy.  2. Left ventricular diastolic parameters are consistent with Grade I diastolic dysfunction (impaired relaxation).  3. The left ventricle has no regional wall motion abnormalities.  4. Global right ventricle has normal systolic function.The right ventricular size is normal. No increase in right ventricular wall thickness.  5. Left atrial size was mildly dilated.  6. Right atrial size  was normal.  7. Mild calcification of the posterior and anterior mitral valve leaflet(s).  8. Mild thickening of the anterior and posterior mitral valve leaflet(s).  9. The mitral valve is grossly normal. Trace mitral valve regurgitation. 10. The tricuspid valve is grossly normal. Tricuspid valve regurgitation is trivial. 11. The aortic valve is tricuspid. Aortic valve regurgitation is not visualized. Mild to moderate aortic valve sclerosis/calcification without any evidence of aortic stenosis. 12. The pulmonic valve was grossly normal. Pulmonic valve regurgitation is not visualized. 13. TR signal is inadequate for assessing pulmonary artery systolic pressure. 14. The inferior vena cava is normal in size with <50% respiratory variability, suggesting right atrial pressure of 8 mmHg. 15. The average left ventricular global longitudinal strain is -18.0 %.   ASSESSMENT AND PLAN:  1.  Coronary artery disease: History of CABG in 2003, without complaints of recurrent chest pain, dyspnea on exertion, or fatigue.  He is to continue current medication regimen at this time with secondary prevention.  2.  Hypertension: Not optimal for diabetic patient with CAD.  However, this is much better than what it had been in the past office visit.  He states that he stopped taking chlorthalidone as this caused his lips to swell.  He is not certain which medications he is taken even though he did bring his  medication bottles with him.  [facility] with medication management and referral to PCP to make sure that he does have follow-up concerning noncardiac issues.  3.  Hypercholesterolemia: He remains on atorvastatin.  He will need follow-up fasting lipids and LFTs on next appointment.  Goal of LDL less than 70.  4.  Non-insulin-dependent diabetes: On Metformin.  He does not have a blood glucose monitor at home nor does he see a PCP regularly for management of diabetes.  [facility] so they can be helpful with connecting him to a primary care physician for ongoing management of his diabetes and noncardiac issues.   Current medicines are reviewed at length with the patient today.    Labs/ tests ordered today include: None   Bettey MareKathryn M. Liborio NixonLawrence DNP, ANP, AACC   03/22/2019 2:02 PM    [facility] [address] Office [phone] Fax [phone]  Notice: This dictation was prepared with Dragon dictation along with smaller phrase technology. Any transcriptional errors that result from this process are unintentional and may not be corrected upon review.

## 2019-03-22 ENCOUNTER — Encounter: Payer: Self-pay | Admitting: Adult Health

## 2019-03-22 ENCOUNTER — Other Ambulatory Visit: Payer: Self-pay

## 2019-03-22 ENCOUNTER — Ambulatory Visit (INDEPENDENT_AMBULATORY_CARE_PROVIDER_SITE_OTHER): Payer: Medicare Other | Admitting: Adult Health

## 2019-03-22 VITALS — BP 146/84 | HR 56 | Temp 97.9°F | Ht 64.0 in | Wt 136.4 lb

## 2019-03-22 DIAGNOSIS — E7800 Pure hypercholesterolemia, unspecified: Secondary | ICD-10-CM

## 2019-03-22 DIAGNOSIS — I10 Essential (primary) hypertension: Secondary | ICD-10-CM

## 2019-03-22 DIAGNOSIS — E139 Other specified diabetes mellitus without complications: Secondary | ICD-10-CM | POA: Diagnosis not present

## 2019-03-22 DIAGNOSIS — I2510 Atherosclerotic heart disease of native coronary artery without angina pectoris: Secondary | ICD-10-CM | POA: Diagnosis not present

## 2019-03-22 NOTE — Patient Instructions (Signed)
PLEASE REESTABLISH CARE WITH YOUR PRIMARY CARE PHYSICIAN FOR DIABETES AND ROUTINE CARE.  You have been referred to [employer] to assist with medication compliance.  Medication Instructions:  Jory Sims, DNP recommends that you continue on your current medications as directed. Please refer to the Current Medication list given to you today.  *If you need a refill on your cardiac medications before your next appointment, please call your pharmacy*  Follow-Up: At [facility], you and your health needs are our priority.  As part of our continuing mission to provide you with exceptional heart care, we have created designated Provider Care Teams.  These Care Teams include your primary Cardiologist (physician) and Advanced Practice Providers (APPs -  Physician Assistants and Nurse Practitioners) who all work together to provide you with the care you need, when you need it.  Your next appointment:   3 month(s)  The format for your next appointment:   Virtual Visit   Provider:   You may see Minus Breeding, MD or one of the following Advanced Practice Providers on your designated Care Team:    Jory Sims, DNP, ANP

## 2019-04-04 ENCOUNTER — Other Ambulatory Visit: Payer: Self-pay | Admitting: *Deleted

## 2019-04-04 NOTE — Patient Outreach (Signed)
[facility]) Care Management  04/04/2019  Cristian Hayden 1941-03-25 [id number]   Referral 03/22/2019 Initial Outreach 04/04/2019  Telephone Assessment-Successful-Discipline [place]  RN spoke with pt today and permission to speak with his daughter Cristian Hayden. RN verified any needs related to the recent referral.   MEDICATIONS:  Pt states he has not filled several of his medications due to inability to afford. States he is not sure which medications but his daughter would be able to recite that information. Daughter states he gets his medications through [facility] and she would be able to provide this information to the pharmacy upon that pending call.  HTN: Pt reports his BP is controlled as he has been taking this medications with no issues reported other then an occasional light headache that quickly resolves.  DM: Pt reports a history but states its been controlled for several years and he has not been requested to monitoring his BS (no glucose meter).  Denies any related symptoms.   Pt states he has been visiting a [place] urgent [facility] clinic on [facility] and there is a Dr. Abran Richard noted on his [facility] insurance card but pt unaware of visits with this provider. RN research this provider and contacted the office. Informed the pt is not a pt with this location. RN further discussed the importance with establishing a local provider to assist with his ongoing management of care. RN offered the daughter Cristian Hayden a contact number for finding a provider for this pt through [facility] health [phone]. Daughter very appreciative and will follow up an establish a new primary provider for this pt. No other needs presented at this time..  Plan: Will completed a discipline closure for this team member and notify one of pt's providers of this change and assessment on this pt. No other inquired or request at this time.  Raina Mina, RN Care Management Coordinator [place] Office [phone]

## 2019-04-05 ENCOUNTER — Telehealth: Payer: Self-pay | Admitting: *Deleted

## 2019-04-05 NOTE — Telephone Encounter (Signed)
[place] Visit Initial Request  Date of Request ([place]):  April 05, 2019  Requesting Provider:  Jory Sims, DNP    Agency Requested:    [facility] Contact:  Glory Buff, NP [address] [place], [place] [address] Phone #:  [phone] Fax #:  [phone]  Patient Demographic Information: Name:  Cristian Hayden Age:  78 y.o.   DOB:  March 10, 1941  MRN:  [id number]   Address:   [address] [place] [address]   Phone Numbers:   Home Phone [phone]  Mobile [phone]     Emergency Contact Information on File:   Contact Information    Name Relation Home Work [place] Spouse [phone]        The above family members may be contacted for information on this patient (review DPR on file):  Yes    Patient Clinical Information:  Primary Care Provider:  Patient, No Pcp Per  Primary Cardiologist:  Minus Breeding, MD  Primary Electrophysiologist:  None   Past Medical Hx: Cristian Hayden  has a past medical history of Arthritis, Bronchitis, CAD (coronary artery disease), Diabetes mellitus without complication ([place]), Hyperlipidemia, Hypertension, Peripheral vascular disease ([place]), and PVD (peripheral vascular disease) ([place]).   Allergies: He has No Known Allergies.   Medications: Current Outpatient Medications on File Prior to Visit  Medication Sig  . ALPHAGAN P 0.1 % SOLN INT 1 GTT LEY BID  . atorvastatin (LIPITOR) 40 MG tablet Take 40 mg by mouth daily.   . carvedilol (COREG) 6.25 MG tablet Take 1 tablet (6.25 mg total) by mouth 2 (two) times daily with a meal.  . chlorthalidone (HYGROTON) 25 MG tablet Take 1 tablet (25 mg total) by mouth daily.  Marland Kitchen dicyclomine (BENTYL) 10 MG capsule Take 1 capsule by mouth 3 (three) times daily.  . dorzolamide-timolol (COSOPT) 22.3-6.8 MG/ML ophthalmic solution Place 1 drop into both eyes 2 (two) times daily.  Marland Kitchen latanoprost (XALATAN) 0.005 % ophthalmic solution  Place 1 drop into both eyes at bedtime.  Marland Kitchen LINZESS 290 MCG CAPS capsule TK 1 C PO QD ON AN EMPTY STOMACH AT LEAST 30 MINUTES BEFORE 1ST MEAL OF THE DAY  . lisinopril (ZESTRIL) 20 MG tablet Take 1 tablet (20 mg total) by mouth 2 (two) times a day.  . metFORMIN (GLUCOPHAGE) 500 MG tablet TK 1 T PO  BID WITH MORNING AND EVENING MEALS  . omeprazole (PRILOSEC) 40 MG capsule Take 40 mg by mouth daily. For 14 days  . pentoxifylline (TRENTAL) 400 MG CR tablet Take 400 mg by mouth 2 (two) times daily.  . polyethylene glycol (MIRALAX / GLYCOLAX) packet Take daily until having regular bowel movements  . tamsulosin (FLOMAX) 0.4 MG CAPS capsule Take 0.8 mg by mouth daily. Reported on 07/10/2015   No current facility-administered medications on file prior to visit.      Social Hx: He  reports that he has been smoking cigarettes. He has been smoking about 0.20 packs per day. He has never used smokeless tobacco. He reports that he does not drink alcohol or use drugs.    Diagnosis/Reason for Visit:   Blood Pressure Management  Services Requested:  Vital Signs (BP, Pulse, O2, Weight)  # of Visits Needed/Frequency per Week: 1

## 2019-04-05 NOTE — Telephone Encounter (Signed)
I will forward Home Visit Request to Glory Buff, NP.

## 2019-04-05 NOTE — Telephone Encounter (Signed)
-----   Message from Lendon Colonel, NP sent at 04/04/2019  2:32 PM EST ----- Regarding: Social Worker Referral Please send a referral to social worker to help assess his needs for better BP management. [facility] can't see him.  She may be able to connect him to [facility] and get his family involved if this is warranted.  Thank you,  Curt Bears

## 2019-04-06 ENCOUNTER — Other Ambulatory Visit: Payer: Self-pay | Admitting: Pharmacist

## 2019-04-06 NOTE — Patient Outreach (Signed)
[facility]) Care Management  [place]   04/06/2019  Cristian Hayden Jun 09, 1940 [id number]  Reason for referral: Medication Management  -Per notes, patient not adherent with medications due to cost, requests f/u with daughter, no PCP on file, follows with [facility], [facility] RN provided daughter with contact information to find PCP via [facility] as patient needs f/u on non-cardiac disease states  Referral source: [facility] RN Current insurance: [facility]  PMHx includes but not limited to:  Tobacco abuse, CAD with hx CABG '03, HTN, HLD, PVD, T2DM, arthritis, bronchitis  Outreach:  Successful telephone call with patient.  HIPAA identifiers verified.   Subjective:  Patient reports his daughter is helping him set up an appointment with her own PCP but has not been able to do so yet.  He denies wanting to review medications with me at this time.  He states after his daughter "sets things up" then he can discuss medications.  He declines wanting to discuss anything related to medication management at this time.  I provided him with my phone number for his daughter to contact if needed.   Plan: Will close case at this time as patient declined services Will update cardiology clinic  Ralene Bathe, PharmD, [place] [phone]

## 2019-04-14 ENCOUNTER — Other Ambulatory Visit: Payer: Self-pay

## 2019-04-14 ENCOUNTER — Ambulatory Visit (HOSPITAL_COMMUNITY)
Admission: EM | Admit: 2019-04-14 | Discharge: 2019-04-14 | Disposition: A | Discharge status: Home / Self Care | Payer: Medicare Other

## 2019-04-14 ENCOUNTER — Encounter (HOSPITAL_COMMUNITY): Payer: Self-pay | Admitting: Emergency Medicine

## 2019-04-14 DIAGNOSIS — K5900 Constipation, unspecified: Secondary | ICD-10-CM | POA: Diagnosis not present

## 2019-04-14 NOTE — ED Provider Notes (Signed)
[place]    CSN: [id number] Arrival date & time: 04/14/19  1411      History   Chief Complaint Chief Complaint  Patient presents with  . Constipation    HPI Cristian Hayden is a 78 y.o. male.   Patient is a 78 year old male the presents today with constipation.  Reporting last bowel movement was Monday. syptoms have been constant.   He has a history of constipation and previously took [place].  He can no longer afford this medication.  He took 1 dose of MiraLAX this morning.  He has had some generalized left-sided abdominal discomfort.  No nausea, vomiting or diarrhea.  No fever.  No rectal pain and blood in stool.  ROS per HPI      Past Medical History:  Diagnosis Date  . Arthritis   . Bronchitis   . CAD (coronary artery disease)   . Diabetes mellitus without complication ([place])   . Hyperlipidemia   . Hypertension   . Peripheral vascular disease ([place])    with claludication  . PVD (peripheral vascular disease) [facility])     Patient Active Problem List   Diagnosis Date Noted  . Coronary artery disease 10/27/2018  . Educated about COVID-19 virus infection 10/27/2018  . Diabetes mellitus ([place]) 10/27/2018  . Pure hypercholesterolemia 10/27/2018  . Hypertension 10/27/2018  . Stomach pain 07/06/2014  . Lumbago 07/06/2014  . Central retinal artery occlusion of right eye   . CAD (coronary artery disease), native coronary artery 12/15/2012  . Abnormal EKG 05/25/2012  . Numbness in left leg 03/08/2012  . Peripheral vascular disease, unspecified ([place]) 03/08/2012  . Cold 03/08/2012  . 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 CATARACT EXTRACTION  June 2013   Cataract Right eye  . CORONARY ARTERY BYPASS GRAFT     LIMA to LAD, SVG to PDA, SVG to OM1/OM2  . Femoral-Femoral BPGA  05/29/1999   Left profundoplasty  and Left Fem/Pop 05/28/2008       Home Medications    Prior  to Admission medications   Medication Sig Start Date End Date Taking? Authorizing Provider  atorvastatin (LIPITOR) 40 MG tablet Take 40 mg by mouth daily.  06/02/16  Yes [provider]  carvedilol (COREG) 6.25 MG tablet Take 1 tablet (6.25 mg total) by mouth 2 (two) times daily with a meal. 02/15/19  Yes Lendon Colonel, NP  chlorthalidone (HYGROTON) 25 MG tablet Take 1 tablet (25 mg total) by mouth daily. 02/15/19  Yes Lendon Colonel, NP  lisinopril (ZESTRIL) 20 MG tablet Take 1 tablet (20 mg total) by mouth 2 (two) times a day. 10/27/18  Yes Minus Breeding, MD  metFORMIN (GLUCOPHAGE) 500 MG tablet TK 1 T PO  BID WITH MORNING AND EVENING MEALS 09/11/18  Yes [provider]  pentoxifylline (TRENTAL) 400 MG CR tablet Take 400 mg by mouth 2 (two) times daily. 09/03/16  Yes [provider]  tamsulosin (FLOMAX) 0.4 MG CAPS capsule Take 0.8 mg by mouth daily. Reported on 07/10/2015   Yes [provider]  ALPHAGAN P 0.1 % SOLN INT 1 GTT LEY BID 09/03/18   [provider]  dicyclomine (BENTYL) 10 MG capsule Take 1 capsule by mouth 3 (three) times daily. 01/10/19   [provider]  dorzolamide-timolol (COSOPT) 22.3-6.8 MG/ML ophthalmic solution Place 1 drop into both eyes 2 (two) times daily. 09/08/16   [provider]  latanoprost (XALATAN) 0.005 % ophthalmic solution Place 1 drop into both eyes at bedtime. 09/08/16   [provider]  LINZESS 290 MCG CAPS capsule TK 1 C PO QD ON AN EMPTY STOMACH AT LEAST 30 MINUTES BEFORE 1ST MEAL OF THE DAY 10/12/18   [provider]  omeprazole (PRILOSEC) 40 MG capsule Take 40 mg by mouth daily. For 14 days 10/01/16 02/15/19  [provider]  polyethylene glycol (MIRALAX / GLYCOLAX) packet Take daily until having regular bowel movements 10/08/16   Corena Herter, MD    Family History Family History  Problem Relation Age of Onset  . Hypertension Mother   . Heart disease Father 20        CAD  . Hypertension Father   . Hypertension Sister   . Hypertension Brother     Social History Social History   Tobacco Use  . Smoking status: Current Some Day Smoker    Packs/day: 0.20    Types: Cigarettes  . Smokeless tobacco: Never Used  . Tobacco comment: pt states he is trying, only smokes about 2-3 per day  Substance Use Topics  . Alcohol use: No    Comment: former  . Drug use: No     Allergies   Patient has no known allergies.   Review of Systems Review of Systems   Physical Exam Triage Vital Signs ED Triage Vitals  Enc Vitals Group     BP 04/14/19 1445 (!) 185/74     Pulse Rate 04/14/19 1445 (!) 58     Resp 04/14/19 1445 18     Temp 04/14/19 1445 98.6 F (37 C)     Temp Source 04/14/19 1445 Oral     SpO2 04/14/19 1445 99 %     Weight --      Height --      Head Circumference --      Peak Flow --      Pain Score 04/14/19 1442 3     Pain Loc --      Pain Edu? --      Excl. in GC? --    No data found.  Updated Vital Signs BP (!) 185/74 (BP Location: Left Arm)   Pulse (!) 58   Temp 98.6 F (37 C) (Oral)   Resp 18   SpO2 99%   Visual Acuity Right Eye Distance:   Left Eye Distance:   Bilateral Distance:    Right Eye Near:   Left Eye Near:    Bilateral Near:     Physical Exam Vitals and nursing note reviewed.  Constitutional:      General: He is not in acute distress.    Appearance: Normal appearance. He is not ill-appearing, toxic-appearing or diaphoretic.  HENT:     Head: Normocephalic and atraumatic.  Eyes:     Conjunctiva/sclera: Conjunctivae normal.  Pulmonary:     Effort: Pulmonary effort is normal.  Abdominal:     General: Bowel sounds are normal.     Palpations: Abdomen is soft.     Tenderness: There is abdominal tenderness. There is no guarding.     Comments: Mild tenderness to left upper and left lower quadrant.  No guarding or rebound. Abdomen is soft  Musculoskeletal:        General: Normal range of motion.   Skin:    General: Skin is warm and dry.  Neurological:     Mental Status: He is alert.  Psychiatric:        Mood  and Affect: Mood normal.      UC Treatments / Results  Labs (all labs ordered are listed, but only abnormal results are displayed) Labs Reviewed - No data to display  EKG   Radiology No results found.  Procedures Procedures (including critical care time)  Medications Ordered in UC Medications - No data to display  Initial Impression / Assessment and Plan / UC Course  I have reviewed the triage vital signs and the nursing notes.  Pertinent labs & imaging results that were available during my care of the patient were reviewed by me and considered in my medical decision making (see chart for details).     Constipation-we will have him increase MiraLAX to 1 scoop twice a day.  Reassured it may take a few days for this to work. Recommend increase water intake Return precautions given. Final Clinical Impressions(s) / UC Diagnoses   Final diagnoses:  Constipation, unspecified constipation type     Discharge Instructions     Do the MiraLAX 1 scoop twice a day and to have a good bowel movement.  He can then back off and do once a day once every couple days to stay regular. Make sure you are drinking plenty of water Follow up as needed for continued or worsening symptoms     ED Prescriptions    None     PDMP not reviewed this encounter.   Janace ArisBast, [place] Pacella A, NP 04/14/19 1512

## 2019-04-14 NOTE — ED Triage Notes (Signed)
Pt reports a history of constipation but was taking medication for it.  He states the price of the medication went up and he cant afford it anymore.  He hasn't had it in a week or two.  Pt states his last BM was Monday or Tuesday.  He is having some LUQ and LLQ pain.

## 2019-04-14 NOTE — Discharge Instructions (Addendum)
Do the MiraLAX 1 scoop twice a day and to have a good bowel movement.  He can then back off and do once a day once every couple days to stay regular. Make sure you are drinking plenty of water Follow up as needed for continued or worsening symptoms

## 2019-05-16 ENCOUNTER — Telehealth (HOSPITAL_COMMUNITY): Payer: Self-pay

## 2019-05-16 ENCOUNTER — Other Ambulatory Visit: Payer: Self-pay

## 2019-05-16 DIAGNOSIS — I739 Peripheral vascular disease, unspecified: Secondary | ICD-10-CM

## 2019-05-16 NOTE — Telephone Encounter (Signed)

## 2019-05-17 ENCOUNTER — Other Ambulatory Visit: Payer: Self-pay

## 2019-05-17 ENCOUNTER — Encounter: Payer: Self-pay | Admitting: Vascular Surgery

## 2019-05-17 ENCOUNTER — Other Ambulatory Visit: Payer: Self-pay | Admitting: *Deleted

## 2019-05-17 ENCOUNTER — Ambulatory Visit (INDEPENDENT_AMBULATORY_CARE_PROVIDER_SITE_OTHER)
Admission: RE | Admit: 2019-05-17 | Discharge: 2019-05-17 | Disposition: A | Discharge status: Home / Self Care | Payer: Medicare Other | Source: Ambulatory Visit | Attending: Vascular Surgery | Admitting: Vascular Surgery

## 2019-05-17 ENCOUNTER — Ambulatory Visit (HOSPITAL_COMMUNITY)
Admission: RE | Admit: 2019-05-17 | Discharge: 2019-05-17 | Disposition: A | Discharge status: Home / Self Care | Payer: Medicare Other | Source: Ambulatory Visit | Attending: Vascular Surgery | Admitting: Vascular Surgery

## 2019-05-17 ENCOUNTER — Ambulatory Visit (INDEPENDENT_AMBULATORY_CARE_PROVIDER_SITE_OTHER): Payer: Medicare Other | Admitting: Vascular Surgery

## 2019-05-17 VITALS — BP 155/81 | HR 54 | Temp 97.7°F | Resp 20 | Ht 64.0 in | Wt 132.0 lb

## 2019-05-17 DIAGNOSIS — R0989 Other specified symptoms and signs involving the circulatory and respiratory systems: Secondary | ICD-10-CM | POA: Diagnosis not present

## 2019-05-17 DIAGNOSIS — I70213 Atherosclerosis of native arteries of extremities with intermittent claudication, bilateral legs: Secondary | ICD-10-CM | POA: Diagnosis not present

## 2019-05-17 DIAGNOSIS — I739 Peripheral vascular disease, unspecified: Secondary | ICD-10-CM | POA: Diagnosis not present

## 2019-05-17 DIAGNOSIS — I7409 Other arterial embolism and thrombosis of abdominal aorta: Secondary | ICD-10-CM | POA: Diagnosis not present

## 2019-05-17 NOTE — Progress Notes (Signed)
Patient name: Cristian Hayden MRN: [id number] DOB: 1941-02-25 Sex: male  REASON FOR VISIT:   Follow-up of peripheral vascular disease.  HPI:   Cristian Hayden is a pleasant 79 y.o. male who I last saw in July 2020.  He is undergone a right to left femorofemoral bypass by Dr. Madilyn Fireman in 2016.  The graft is chronically occluded.  We have been following him with bilateral calf claudication.  Since I saw him last, he continues to have bilateral calf claudication.  He states that he can walk less than a half a mile before experiencing symptoms.  The symptoms are slightly more significant on the right side.  He denies any history of rest pain or nonhealing ulcers.  He denies any thigh or hip claudication.  He denies any history of rest pain or nonhealing ulcers.  His risk factors for peripheral vascular disease include diabetes, hypertension, hyperlipidemia, and continued tobacco use.  He smokes about a half a pack per day of cigarettes.  He denies any history of stroke, TIAs, expressive or receptive aphasia, or amaurosis fugax.  Past Medical History:  Diagnosis Date  . Arthritis   . Bronchitis   . CAD (coronary artery disease)   . Diabetes mellitus without complication ([facility])   . Hyperlipidemia   . Hypertension   . Peripheral vascular disease ([facility])    with claludication  . PVD (peripheral vascular disease) ([facility])     Family History  Problem Relation Age of Onset  . Hypertension Mother   . Heart disease Father 31       CAD  . Hypertension Father   . Hypertension Sister   . Hypertension Brother     SOCIAL HISTORY: Social History   Tobacco Use  . Smoking status: Current Some Day Smoker    Packs/day: 0.20    Types: Cigarettes  . Smokeless tobacco: Never Used  . Tobacco comment: pt states he is trying, only smokes about 2-3 per day  Substance Use Topics  . Alcohol use: No    Comment: former    No Known Allergies  Current Outpatient Medications  Medication Sig Dispense Refill    . albuterol (VENTOLIN HFA) 108 (90 Base) MCG/ACT inhaler SMARTSIG:2 Puff(s) By Mouth Every 6 Hours    . ALPHAGAN P 0.1 % SOLN INT 1 GTT LEY BID    . atorvastatin (LIPITOR) 40 MG tablet Take 40 mg by mouth daily.     . carvedilol (COREG) 6.25 MG tablet Take 1 tablet (6.25 mg total) by mouth 2 (two) times daily with a meal. 90 tablet 3  . chlorthalidone (HYGROTON) 25 MG tablet Take 1 tablet (25 mg total) by mouth daily. 90 tablet 3  . lisinopril (ZESTRIL) 20 MG tablet Take 1 tablet (20 mg total) by mouth 2 (two) times a day. 180 tablet 3  . metFORMIN (GLUCOPHAGE) 500 MG tablet TK 1 T PO  BID WITH MORNING AND EVENING MEALS    . pentoxifylline (TRENTAL) 400 MG CR tablet Take 400 mg by mouth 2 (two) times daily.  5  . potassium chloride SA (KLOR-CON) 20 MEQ tablet Take 20 mEq by mouth 2 (two) times daily.    . SYMBICORT 80-4.5 MCG/ACT inhaler     . tamsulosin (FLOMAX) 0.4 MG CAPS capsule Take 0.8 mg by mouth daily. Reported on 07/10/2015    . dicyclomine (BENTYL) 10 MG capsule Take 1 capsule by mouth 3 (three) times daily.    . dorzolamide-timolol (COSOPT) 22.3-6.8 MG/ML ophthalmic solution Place 1  drop into both eyes 2 (two) times daily.  4  . latanoprost (XALATAN) 0.005 % ophthalmic solution Place 1 drop into both eyes at bedtime.  4  . LINZESS 290 MCG CAPS capsule TK 1 C PO QD ON AN EMPTY STOMACH AT LEAST 30 MINUTES BEFORE 1ST MEAL OF THE DAY    . polyethylene glycol (MIRALAX / GLYCOLAX) packet Take daily until having regular bowel movements (Patient not taking: Reported on 05/17/2019) 100 each 0   No current facility-administered medications for this visit.    REVIEW OF SYSTEMS:  [X]  denotes positive finding, [ ]  denotes negative finding Cardiac  Comments:  Chest pain or chest pressure:    Shortness of breath upon exertion:    Short of breath when lying flat:    Irregular heart rhythm:        Vascular    Pain in calf, thigh, or hip brought on by ambulation: x   Pain in feet at night that  wakes you up from your sleep:     Blood clot in your veins:    Leg swelling:         Pulmonary    Oxygen at home:    Productive cough:     Wheezing:         Neurologic    Sudden weakness in arms or legs:     Sudden numbness in arms or legs:     Sudden onset of difficulty speaking or slurred speech:    Temporary loss of vision in one eye:     Problems with dizziness:         Gastrointestinal    Blood in stool:     Vomited blood:         Genitourinary    Burning when urinating:     Blood in urine:        Psychiatric    Major depression:         Hematologic    Bleeding problems:    Problems with blood clotting too easily:        Skin    Rashes or ulcers:        Constitutional    Fever or chills:     PHYSICAL EXAM:   Vitals:   05/17/19 1332  BP: (!) 155/81  Pulse: (!) 54  Resp: 20  Temp: 97.7 F (36.5 C)  SpO2: 100%  Weight: 132 lb (59.9 kg)  Height: 5\' 4"  (1.626 m)    GENERAL: The patient is a well-nourished male, in no acute distress. The vital signs are documented above. CARDIAC: There is a regular rate and rhythm.  VASCULAR: He has a right carotid bruit. On the right side, he has a palpable femoral pulse.  I cannot palpate popliteal or pedal pulses. On the left side I cannot palpate a femoral pulse or pedal pulses. He has no significant lower extremity swelling. PULMONARY: There is good air exchange bilaterally without wheezing or rales. ABDOMEN: Soft and non-tender with normal pitched bowel sounds.  I do not palpate an abdominal aortic aneurysm. MUSCULOSKELETAL: There are no major deformities or cyanosis. NEUROLOGIC: No focal weakness or paresthesias are detected. SKIN: There are no ulcers or rashes noted. PSYCHIATRIC: The patient has a normal affect.  DATA:    ARTERIAL DOPPLER STUDY: I have independently interpreted his arterial Doppler study today.  On the right side he has a monophasic dorsalis pedis and posterior tibial signal.  ABI is 56%.   Toe pressure is 139 mmHg.  On the left side he has a monophasic dorsalis pedis and posterior tibial signal.  ABI is 52%.  Toe pressure is 92 mmHg.  CAROTID DUPLEX: I have independently interpreted his carotid duplex scan today.  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  The right vertebral artery is patent with antegrade flow.  On the left side there is no significant carotid stenosis.  The left vertebral artery is patent with antegrade flow.  MEDICAL ISSUES:   PERIPHERAL VASCULAR DISEASE: The patient has stable multilevel arterial occlusive disease.  He has claudication bilaterally which is not changed in the last 6 months.  His ABIs are stable.  We have again discussed the importance of tobacco cessation.  I encouraged him to stay as active as possible.  I have ordered a follow-up ABIs in 1 year and I will see him back at that time.  He knows to call sooner if he has problems.  RIGHT CAROTID BRUIT: Given that the patient had a right carotid bruit I obtained a carotid duplex scan today.  This shows a 40 to 59% right carotid stenosis with no significant stenosis on the left.  I have ordered a follow-up carotid duplex scan in 1 year and I will see him back at that time.  He is on a statin.  I have encouraged him to begin taking 81 mg of aspirin daily.  This had been discontinued at some point in the past.  Deitra Mayo [facility] [phone]

## 2019-05-19 ENCOUNTER — Other Ambulatory Visit: Payer: Self-pay | Admitting: *Deleted

## 2019-05-19 DIAGNOSIS — R0989 Other specified symptoms and signs involving the circulatory and respiratory systems: Secondary | ICD-10-CM

## 2019-05-19 DIAGNOSIS — I739 Peripheral vascular disease, unspecified: Secondary | ICD-10-CM

## 2019-05-29 DIAGNOSIS — E119 Type 2 diabetes mellitus without complications: Secondary | ICD-10-CM | POA: Diagnosis not present

## 2019-05-29 DIAGNOSIS — I2510 Atherosclerotic heart disease of native coronary artery without angina pectoris: Secondary | ICD-10-CM | POA: Diagnosis not present

## 2019-05-29 DIAGNOSIS — D649 Anemia, unspecified: Secondary | ICD-10-CM | POA: Diagnosis not present

## 2019-05-29 DIAGNOSIS — Z720 Tobacco use: Secondary | ICD-10-CM | POA: Diagnosis not present

## 2019-05-29 DIAGNOSIS — E876 Hypokalemia: Secondary | ICD-10-CM | POA: Diagnosis not present

## 2019-05-29 DIAGNOSIS — J410 Simple chronic bronchitis: Secondary | ICD-10-CM | POA: Diagnosis not present

## 2019-05-29 DIAGNOSIS — I10 Essential (primary) hypertension: Secondary | ICD-10-CM | POA: Diagnosis not present

## 2019-05-29 DIAGNOSIS — E782 Mixed hyperlipidemia: Secondary | ICD-10-CM | POA: Diagnosis not present

## 2019-06-01 ENCOUNTER — Other Ambulatory Visit: Payer: Self-pay | Admitting: *Deleted

## 2019-06-01 NOTE — Patient Outreach (Signed)
[facility]) Care Management  06/01/2019  Cristian Hayden 12-24-1940 [id number]    Referral Received 05/29/2019 Initial Outreach 06/01/2019  Telephone Assessment  RN spoke with pt and reintroduced the [facility] program as pt remembers a recent call with this RN case manager back in Dec 2020 (referral outreach). Pt confirms he has a provider he recent visited however states his daughter [name] arranges all his medical appointments. RN inquired on his health HTN/DM as pt states "they are still working on that". Pt requested RN inquired further with his daughter [name] and provided her contact number.  Pt does not wish to proceed with RN case manager on services but requested RN contact his daughter.   RN attempted to contact pt's daughter [name] [phone] however the line was busy with two attempts.  Plan: Will scheduled another outreach call for pending services.  Elliot Cousin, RN Care Management Coordinator [facility] Main Office [phone]

## 2019-06-07 ENCOUNTER — Other Ambulatory Visit: Payer: Self-pay | Admitting: *Deleted

## 2019-06-07 NOTE — Patient Outreach (Signed)
[facility]) Care Management  06/07/2019  Cristian Hayden December 13, 1940 [id number]    Telephone Assessment-Successful w/ Enrollment  RN spoke with the pt's daughter as requested [name]) who was very receptive to enrolling pt into the [facility] program and services. Daughter was able to verify the pt's new provider and indicated pt recent has a visit with his new provider on 2/1. Several topics discussion related to pt's medical history with DM and HTN. Caregiver daughter has indicated pt's DM readings are controlled around 93 AM readings however the last A1c noted in Epic was July 2000 at 6.2. Encouraged the new provider to recheck if she will be following the pt for his diabetes and briefly dicussed the risk if not monitored. States pt's BP is also normal with no acute issues or problems at this time however continue to be receptive to enrollment based upon the services that are available for social workers, pharmacy and nursing via telephone care management services. RN discussed prevention measures and offered to sent [facility] information packet for better monitoring tools along with a A.D and rescheduled another outreach call to completed the initial assessment. Daughter requested a call back for tomorrow as she is off from work and will be at the pt's home for any additional inquires or the initial assessment.   Plan of care discussion and generated related to  Prevention measures and pt's provider's office will be updated on pt's enrollment. No other inquires or request at this time as RN case manager will follow up tomorrow to completed this process.   TH[facility] CM Care Plan Problem One     Most Recent Value  Care Plan Problem One  Knowledge deficit related to prevention measures  Role Documenting the Problem One  Care Management Telephonic Coordinator  Care Plan for Problem One  Active  THN Long Term Goal   Pt will demonstrate the use of safety  measures to prevent acute events from occurring  within the next 90 days.  [facility]N Long Term Goal Start Date  06/07/19  Interventions for Problem One Long Term Goal  Will educate and discuss prevention measures to lower the risk from acute events from occurring. Will send [facility] packet in the mail fr ongoingmethods of monitorin his BS and BP for providers to  view.  [facility] CM Short Term Goal #1   Adherence with medical appointments within the next 30 days.  [facility] CM Short Term Goal #1 Start Date  06/07/19  Interventions for Short Term Goal #1  Will verify all appointments and that pt has sufficient transportation to all appointments. Will also provider othter resources of transportation if needed in the future.Will stongly encouraged attendance to all medical appointments.  [facility]N CM Short Term Goal #2   Pt will agree to ask for assistance to prevent acute events from his caregivers within the next 30 days.  [facility]N CM Short Term Goal #2 Start Date  06/07/19  Interventions for Short Term Goal #2  Will stress the importance of requesting assistance to prevent acute events such as falls, medication errors or inability to completed any task both inside and outside the home.      Elliot Cousin, RN Care Management Coordinator [facility] Main Office [phone]

## 2019-06-08 ENCOUNTER — Other Ambulatory Visit: Payer: Self-pay | Admitting: *Deleted

## 2019-06-08 NOTE — Patient Outreach (Signed)
[facility]) Care Management  06/08/2019  Cristian Hayden 10-15-1940 [id number]    Telephone Assessment  RN followed up as requested today to complete the initial assessment. RN spoke with the pt's daughter [name]) who requested a call back next week on Thursday to completed the initial assessment.   Plan: Will follow up next Thursday as requested.  Elliot Cousin, RN Care Management Coordinator [facility] Main Office [phone]

## 2019-06-15 ENCOUNTER — Other Ambulatory Visit: Payer: Self-pay | Admitting: *Deleted

## 2019-06-15 ENCOUNTER — Encounter: Payer: Self-pay | Admitting: *Deleted

## 2019-06-15 NOTE — Patient Outreach (Signed)
[facility]) Care Management  06/15/2019  Cristian Hayden 12-02-1940 [id number]   Telephone Assessment-Completed Initial Assessment  RN spoke with daughter Cristian Hayden to completed the initial assessment as discussed earlier in this month. No other inquires or request at this time.  PLAN: Will follow up quarterly as discussed and requested.  Elliot Cousin, RN Care Management Coordinator [facility] Main Office [phone]

## 2019-06-21 DIAGNOSIS — E785 Hyperlipidemia, unspecified: Secondary | ICD-10-CM | POA: Insufficient documentation

## 2019-06-21 NOTE — Progress Notes (Signed)
Virtual Visit via Telephone Note   This visit type was conducted due to national recommendations for restrictions regarding the COVID-19 Pandemic (e.g. social distancing) in an effort to limit this patient's exposure and mitigate transmission in our community.  Due to his co-morbid illnesses, this patient is at least at moderate risk for complications without adequate follow up.  This format is felt to be most appropriate for this patient at this time.  The patient did not have access to video technology/had technical difficulties with video requiring transitioning to audio format only (telephone).  All issues noted in this document were discussed and addressed.  No physical exam could be performed with this format.  Please refer to the patient's chart for his  consent to telehealth for [facility].   Date:  06/22/2019   ID:  Cristian Hayden, DOB 08-Oct-1940, MRN [id number]  Patient Location: Home Provider Location: Home  PCP:  Norm Salt, PA  Cardiologist:  Rollene Rotunda, MD  Electrophysiologist:  None   Evaluation Performed:  Follow-Up Visit  Chief Complaint:  CAD  History of Present Illness:    Cristian Hayden is a 79 y.o. male who presents for follow up of coronary artery disease with history of CABG in 2003, (LIMA to LAD, SVG to PDA, SVG to OM1/OM2) PAD status post femorofemoral BP in [place] on 05/28/2008.   Since I last saw him he has done well.  He is limited by some leg pain but he tries to get outside and walk down the block. The patient denies any new symptoms such as chest discomfort, neck or arm discomfort. There has been no new shortness of breath, PND or orthopnea. There have been no reported palpitations, presyncope or syncope.   The patient does not have symptoms concerning for COVID-19 infection (fever, chills, cough, or new shortness of breath).    Past Medical History:  Diagnosis Date  . Arthritis   . Bronchitis   . CAD (coronary artery disease)   . Diabetes  mellitus without complication (HCC)   . Hyperlipidemia   . Hypertension   . Peripheral vascular disease (HCC)    with claludication  . PVD (peripheral vascular disease) (HCC)    Past Surgical History:  Procedure Laterality Date  . CATARACT EXTRACTION  June 2013   Cataract Right eye  . CORONARY ARTERY BYPASS GRAFT     LIMA to LAD, SVG to PDA, SVG to OM1/OM2  . Femoral-Femoral BPGA  05/29/1999   Left profundoplasty  and Left Fem/Pop 05/28/2008     Current Meds  Medication Sig  . albuterol (VENTOLIN HFA) 108 (90 Base) MCG/ACT inhaler SMARTSIG:2 Puff(s) By Mouth Every 6 Hours  . ALPHAGAN P 0.1 % SOLN INT 1 GTT LEY BID  . atorvastatin (LIPITOR) 40 MG tablet Take 40 mg by mouth daily.   . carvedilol (COREG) 6.25 MG tablet Take 1 tablet (6.25 mg total) by mouth 2 (two) times daily with a meal.  . chlorthalidone (HYGROTON) 25 MG tablet Take 1 tablet (25 mg total) by mouth daily.  Marland Kitchen dicyclomine (BENTYL) 10 MG capsule Take 1 capsule by mouth 3 (three) times daily.  . dorzolamide-timolol (COSOPT) 22.3-6.8 MG/ML ophthalmic solution Place 1 drop into both eyes 2 (two) times daily.  Marland Kitchen latanoprost (XALATAN) 0.005 % ophthalmic solution Place 1 drop into both eyes at bedtime.  Marland Kitchen LINZESS 290 MCG CAPS capsule TK 1 C PO QD ON AN EMPTY STOMACH AT LEAST 30 MINUTES BEFORE 1ST MEAL OF THE DAY  .  lisinopril (ZESTRIL) 20 MG tablet Take 1 tablet (20 mg total) by mouth 2 (two) times a day.  . metFORMIN (GLUCOPHAGE) 500 MG tablet TK 1 T PO  BID WITH MORNING AND EVENING MEALS  . pentoxifylline (TRENTAL) 400 MG CR tablet Take 400 mg by mouth 2 (two) times daily.  . polyethylene glycol (MIRALAX / GLYCOLAX) packet Take daily until having regular bowel movements  . potassium chloride SA (KLOR-CON) 20 MEQ tablet Take 20 mEq by mouth 2 (two) times daily.  . SYMBICORT 80-4.5 MCG/ACT inhaler   . tamsulosin (FLOMAX) 0.4 MG CAPS capsule Take 0.8 mg by mouth daily. Reported on 07/10/2015     Allergies:   Patient has no known  allergies.   Social History   Tobacco Use  . Smoking status: Current Some Day Smoker    Packs/day: 0.20    Types: Cigarettes  . Smokeless tobacco: Never Used  . Tobacco comment: pt states he is trying, only smokes about 2-3 per day  Substance Use Topics  . Alcohol use: No    Comment: former  . Drug use: No     Family Hx: The patient's family history includes Heart disease (age of onset: 46) in his father; Hypertension in his brother, father, mother, and sister.  ROS:   Please see the history of present illness.    As stated in the HPI and negative for all other systems.   Prior CV studies:   The following studies were reviewed today:  Labs  Labs/Other Tests and Data Reviewed:    EKG:  No ECG reviewed.  Recent Labs: 11/17/2018: ALT 14; BUN 10; Creatinine, Ser 0.82; Hemoglobin 13.8; Platelets 177; Potassium 4.6; Sodium 146   Recent Lipid Panel Lab Results  Component Value Date/Time   CHOL 116 11/17/2018 11:25 AM   TRIG 80 11/17/2018 11:25 AM   HDL 44 11/17/2018 11:25 AM   CHOLHDL 2.6 11/17/2018 11:25 AM   CHOLHDL 3 03/02/2013 07:40 AM   LDLCALC 56 11/17/2018 11:25 AM    Wt Readings from Last 3 Encounters:  05/17/19 132 lb (59.9 kg)  03/22/19 136 lb 6.4 oz (61.9 kg)  02/15/19 140 lb 9.6 oz (63.8 kg)     Objective:    Vital Signs:  There were no vitals taken for this visit.     ASSESSMENT & PLAN:    Coronary artery disease:   The patient has no new sypmtoms.  No further cardiovascular testing is indicated.  We will continue with aggressive risk reduction and meds as listed.ntion.  Hypertension:  He does not have a blood pressure cuff at home but he says when his daughter checks it it is fine.  No change in therapy.  Hypercholesterolemia:    His last LDL was 56.  No change in therapy.  Non-insulin-dependent diabetes: A1c 6.2.  He will continue on the meds as listed.   Carotid stenosis:   Last year he had 40 to 59% right stenosis and this was followed  by [facility]  COVID-19 Education:   He apparently had a phone call yesterday to set up his vaccine and his daughters can help him with this.   Time:   Today, I have spent 16 minutes with the patient with telehealth technology discussing the above problems.     Medication Adjustments/Labs and Tests Ordered: Current medicines are reviewed at length with the patient today.  Concerns regarding medicines are outlined above.   Tests Ordered: No orders of the defined types were placed in this encounter.  Medication Changes: No orders of the defined types were placed in this encounter.   Follow Up:  In Person one year  Signed, Minus Breeding, MD  06/22/2019 1:16 PM    [facility]

## 2019-06-22 ENCOUNTER — Telehealth (INDEPENDENT_AMBULATORY_CARE_PROVIDER_SITE_OTHER): Payer: PPO | Admitting: Cardiology

## 2019-06-22 ENCOUNTER — Encounter: Payer: Self-pay | Admitting: Cardiology

## 2019-06-22 DIAGNOSIS — I2510 Atherosclerotic heart disease of native coronary artery without angina pectoris: Secondary | ICD-10-CM | POA: Diagnosis not present

## 2019-06-22 DIAGNOSIS — I10 Essential (primary) hypertension: Secondary | ICD-10-CM

## 2019-06-22 DIAGNOSIS — E118 Type 2 diabetes mellitus with unspecified complications: Secondary | ICD-10-CM

## 2019-06-22 DIAGNOSIS — Z7189 Other specified counseling: Secondary | ICD-10-CM

## 2019-06-22 DIAGNOSIS — I6521 Occlusion and stenosis of right carotid artery: Secondary | ICD-10-CM

## 2019-06-22 DIAGNOSIS — E785 Hyperlipidemia, unspecified: Secondary | ICD-10-CM | POA: Diagnosis not present

## 2019-06-22 NOTE — Patient Instructions (Addendum)
Medication Instructions:  No Changes *If you need a refill on your cardiac medications before your next appointment, please call your pharmacy*  Lab Work: None  Testing/Procedures: None  Follow-Up: At [facility], you and your health needs are our priority.  As part of our continuing mission to provide you with exceptional heart care, we have created designated Provider Care Teams.  These Care Teams include your primary Cardiologist (physician) and Advanced Practice Providers (APPs -  Physician Assistants and Nurse Practitioners) who all work together to provide you with the care you need, when you need it.  Your next appointment:   1 year(s)  You will receive a reminder letter in the mail two months in advance. If you don't receive a letter, please call our office to schedule the follow-up appointment.   The format for your next appointment:   In Person  Provider:   James Hochrein, MD   

## 2019-06-23 DIAGNOSIS — H47011 Ischemic optic neuropathy, right eye: Secondary | ICD-10-CM | POA: Diagnosis not present

## 2019-06-23 DIAGNOSIS — H35033 Hypertensive retinopathy, bilateral: Secondary | ICD-10-CM | POA: Diagnosis not present

## 2019-06-23 DIAGNOSIS — H401132 Primary open-angle glaucoma, bilateral, moderate stage: Secondary | ICD-10-CM | POA: Diagnosis not present

## 2019-06-23 DIAGNOSIS — H16223 Keratoconjunctivitis sicca, not specified as Sjogren's, bilateral: Secondary | ICD-10-CM | POA: Diagnosis not present

## 2019-06-23 DIAGNOSIS — H04123 Dry eye syndrome of bilateral lacrimal glands: Secondary | ICD-10-CM | POA: Diagnosis not present

## 2019-06-23 DIAGNOSIS — H11153 Pinguecula, bilateral: Secondary | ICD-10-CM | POA: Diagnosis not present

## 2019-06-23 DIAGNOSIS — H18413 Arcus senilis, bilateral: Secondary | ICD-10-CM | POA: Diagnosis not present

## 2019-06-23 DIAGNOSIS — H02402 Unspecified ptosis of left eyelid: Secondary | ICD-10-CM | POA: Diagnosis not present

## 2019-06-23 DIAGNOSIS — H43822 Vitreomacular adhesion, left eye: Secondary | ICD-10-CM | POA: Diagnosis not present

## 2019-07-03 ENCOUNTER — Other Ambulatory Visit: Payer: Self-pay | Admitting: *Deleted

## 2019-07-03 ENCOUNTER — Encounter (INDEPENDENT_AMBULATORY_CARE_PROVIDER_SITE_OTHER): Payer: PPO | Admitting: Ophthalmology

## 2019-07-03 DIAGNOSIS — H43813 Vitreous degeneration, bilateral: Secondary | ICD-10-CM

## 2019-07-03 DIAGNOSIS — H35033 Hypertensive retinopathy, bilateral: Secondary | ICD-10-CM

## 2019-07-03 DIAGNOSIS — I10 Essential (primary) hypertension: Secondary | ICD-10-CM | POA: Diagnosis not present

## 2019-07-03 DIAGNOSIS — H47211 Primary optic atrophy, right eye: Secondary | ICD-10-CM | POA: Diagnosis not present

## 2019-07-03 DIAGNOSIS — H35372 Puckering of macula, left eye: Secondary | ICD-10-CM
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
## 2019-07-03 NOTE — Patient Outreach (Signed)
[facility]) Care Management  07/03/2019  PRUITT TABOADA 1940-09-24 [id number]   Case Closure  Pt has changed his [employer] to [employer] and will be under the UM department with [employer]. RN has spoke with pt's daughter [name] concerning case closure with this RN case manager however pt's care will be managed under an external case management services. Will alert provider of this change.  Elliot Cousin, RN Care Management Coordinator [facility] Main Office [phone]

## 2019-07-10 DIAGNOSIS — Z720 Tobacco use: Secondary | ICD-10-CM | POA: Diagnosis not present

## 2019-07-10 DIAGNOSIS — N289 Disorder of kidney and ureter, unspecified: Secondary | ICD-10-CM | POA: Diagnosis not present

## 2019-07-10 DIAGNOSIS — E876 Hypokalemia: Secondary | ICD-10-CM | POA: Diagnosis not present

## 2019-07-10 DIAGNOSIS — I10 Essential (primary) hypertension: Secondary | ICD-10-CM | POA: Diagnosis not present

## 2019-07-10 DIAGNOSIS — E119 Type 2 diabetes mellitus without complications: Secondary | ICD-10-CM | POA: Diagnosis not present

## 2019-07-10 DIAGNOSIS — I2510 Atherosclerotic heart disease of native coronary artery without angina pectoris: Secondary | ICD-10-CM | POA: Diagnosis not present

## 2019-07-10 DIAGNOSIS — J410 Simple chronic bronchitis: Secondary | ICD-10-CM | POA: Diagnosis not present

## 2019-07-10 DIAGNOSIS — E782 Mixed hyperlipidemia: Secondary | ICD-10-CM | POA: Diagnosis not present

## 2019-07-17 ENCOUNTER — Other Ambulatory Visit: Payer: Self-pay | Admitting: *Deleted

## 2019-07-17 NOTE — Patient Outreach (Signed)
[facility]) Care Management  07/17/2019  CHIOKE NOXON 21-Jan-1941 [id number]   Care Coordination   Daughter [name]) contacted RN case manager inquiring on diabetic information (offered to send emmi). Inquired on coverage for the glucometer. Due to recent insurance change from [facility] to HTA may have different products covered. Encouraged caregiver to call HTA for resources and further information device coverage.   Also inquired on [facility] indicating pt not enrolled. RN verified with [facility] that pt had a home visit in January and continue to follow this pt. RN relayed this information to the daughter [name] and provided a contact number for further inquires.  No further inquiries or request at this time. Case has been officially closed via this Scientist, physiological.  Elliot Cousin, RN Care Management Coordinator [facility] Main Office [phone]

## 2019-07-28 ENCOUNTER — Other Ambulatory Visit: Payer: Self-pay | Admitting: Adult Health

## 2019-08-07 DIAGNOSIS — E119 Type 2 diabetes mellitus without complications: Secondary | ICD-10-CM | POA: Diagnosis not present

## 2019-08-07 DIAGNOSIS — Z720 Tobacco use: Secondary | ICD-10-CM | POA: Diagnosis not present

## 2019-08-07 DIAGNOSIS — J410 Simple chronic bronchitis: Secondary | ICD-10-CM | POA: Diagnosis not present

## 2019-08-07 DIAGNOSIS — I10 Essential (primary) hypertension: Secondary | ICD-10-CM | POA: Diagnosis not present

## 2019-08-07 DIAGNOSIS — I2510 Atherosclerotic heart disease of native coronary artery without angina pectoris: Secondary | ICD-10-CM | POA: Diagnosis not present

## 2019-08-07 DIAGNOSIS — E782 Mixed hyperlipidemia: Secondary | ICD-10-CM | POA: Diagnosis not present

## 2019-08-21 DIAGNOSIS — K5900 Constipation, unspecified: Secondary | ICD-10-CM | POA: Diagnosis not present

## 2019-09-01 ENCOUNTER — Encounter: Payer: Self-pay | Admitting: *Deleted

## 2019-09-01 NOTE — Telephone Encounter (Signed)
This encounter was created in error - please disregard.

## 2019-09-21 DIAGNOSIS — H16223 Keratoconjunctivitis sicca, not specified as Sjogren's, bilateral: Secondary | ICD-10-CM | POA: Diagnosis not present

## 2019-09-21 DIAGNOSIS — H47011 Ischemic optic neuropathy, right eye: Secondary | ICD-10-CM | POA: Diagnosis not present

## 2019-09-21 DIAGNOSIS — H401132 Primary open-angle glaucoma, bilateral, moderate stage: Secondary | ICD-10-CM | POA: Diagnosis not present

## 2019-09-21 DIAGNOSIS — H02402 Unspecified ptosis of left eyelid: Secondary | ICD-10-CM | POA: Diagnosis not present

## 2019-09-21 DIAGNOSIS — H18413 Arcus senilis, bilateral: Secondary | ICD-10-CM | POA: Diagnosis not present

## 2019-09-21 DIAGNOSIS — H43822 Vitreomacular adhesion, left eye: Secondary | ICD-10-CM | POA: Diagnosis not present

## 2019-09-21 DIAGNOSIS — H35033 Hypertensive retinopathy, bilateral: Secondary | ICD-10-CM | POA: Diagnosis not present

## 2019-09-21 DIAGNOSIS — H04123 Dry eye syndrome of bilateral lacrimal glands: Secondary | ICD-10-CM | POA: Diagnosis not present

## 2019-10-19 DIAGNOSIS — E119 Type 2 diabetes mellitus without complications: Secondary | ICD-10-CM | POA: Diagnosis not present

## 2019-10-19 DIAGNOSIS — I2510 Atherosclerotic heart disease of native coronary artery without angina pectoris: Secondary | ICD-10-CM | POA: Diagnosis not present

## 2019-10-19 DIAGNOSIS — I10 Essential (primary) hypertension: Secondary | ICD-10-CM | POA: Diagnosis not present

## 2019-10-27 DIAGNOSIS — Z8546 Personal history of malignant neoplasm of prostate: Secondary | ICD-10-CM | POA: Diagnosis not present

## 2019-10-27 DIAGNOSIS — Z125 Encounter for screening for malignant neoplasm of prostate: Secondary | ICD-10-CM | POA: Diagnosis not present

## 2019-10-27 DIAGNOSIS — R3912 Poor urinary stream: Secondary | ICD-10-CM | POA: Diagnosis not present

## 2019-10-27 DIAGNOSIS — I2510 Atherosclerotic heart disease of native coronary artery without angina pectoris: Secondary | ICD-10-CM | POA: Diagnosis not present

## 2019-10-27 DIAGNOSIS — I10 Essential (primary) hypertension: Secondary | ICD-10-CM | POA: Diagnosis not present

## 2019-10-27 DIAGNOSIS — N400 Enlarged prostate without lower urinary tract symptoms: Secondary | ICD-10-CM | POA: Diagnosis not present

## 2019-10-27 DIAGNOSIS — N5201 Erectile dysfunction due to arterial insufficiency: Secondary | ICD-10-CM | POA: Diagnosis not present

## 2019-10-27 DIAGNOSIS — E119 Type 2 diabetes mellitus without complications: Secondary | ICD-10-CM | POA: Diagnosis not present

## 2019-10-27 DIAGNOSIS — N411 Chronic prostatitis: Secondary | ICD-10-CM | POA: Diagnosis not present

## 2019-11-22 ENCOUNTER — Other Ambulatory Visit: Payer: Self-pay | Admitting: Cardiology

## 2019-12-20 DIAGNOSIS — H47011 Ischemic optic neuropathy, right eye: Secondary | ICD-10-CM | POA: Diagnosis not present

## 2019-12-20 DIAGNOSIS — H43822 Vitreomacular adhesion, left eye: Secondary | ICD-10-CM | POA: Diagnosis not present

## 2019-12-20 DIAGNOSIS — H401132 Primary open-angle glaucoma, bilateral, moderate stage: Secondary | ICD-10-CM | POA: Diagnosis not present

## 2019-12-20 DIAGNOSIS — H35033 Hypertensive retinopathy, bilateral: Secondary | ICD-10-CM | POA: Diagnosis not present

## 2019-12-26 DIAGNOSIS — I10 Essential (primary) hypertension: Secondary | ICD-10-CM | POA: Diagnosis not present

## 2019-12-26 DIAGNOSIS — Z720 Tobacco use: Secondary | ICD-10-CM | POA: Diagnosis not present

## 2019-12-26 DIAGNOSIS — E119 Type 2 diabetes mellitus without complications: Secondary | ICD-10-CM | POA: Diagnosis not present

## 2019-12-26 DIAGNOSIS — I2510 Atherosclerotic heart disease of native coronary artery without angina pectoris: Secondary | ICD-10-CM | POA: Diagnosis not present

## 2019-12-26 DIAGNOSIS — J410 Simple chronic bronchitis: Secondary | ICD-10-CM | POA: Diagnosis not present

## 2019-12-26 DIAGNOSIS — E782 Mixed hyperlipidemia: Secondary | ICD-10-CM | POA: Diagnosis not present

## 2020-01-05 ENCOUNTER — Encounter (INDEPENDENT_AMBULATORY_CARE_PROVIDER_SITE_OTHER): Payer: PPO | Admitting: Ophthalmology

## 2020-01-09 ENCOUNTER — Other Ambulatory Visit: Payer: Self-pay

## 2020-01-09 ENCOUNTER — Encounter (INDEPENDENT_AMBULATORY_CARE_PROVIDER_SITE_OTHER): Payer: PPO | Admitting: Ophthalmology

## 2020-01-09 DIAGNOSIS — H3411 Central retinal artery occlusion, right eye: Secondary | ICD-10-CM | POA: Diagnosis not present

## 2020-01-09 DIAGNOSIS — H43813 Vitreous degeneration, bilateral: Secondary | ICD-10-CM

## 2020-01-09 DIAGNOSIS — I10 Essential (primary) hypertension: Secondary | ICD-10-CM

## 2020-01-09 DIAGNOSIS — H35033 Hypertensive retinopathy, bilateral: Secondary | ICD-10-CM

## 2020-01-16 ENCOUNTER — Other Ambulatory Visit: Payer: Self-pay | Admitting: Adult Health

## 2020-01-16 ENCOUNTER — Other Ambulatory Visit: Payer: Self-pay | Admitting: Cardiology

## 2020-01-22 DIAGNOSIS — H04123 Dry eye syndrome of bilateral lacrimal glands: Secondary | ICD-10-CM | POA: Diagnosis not present

## 2020-01-22 DIAGNOSIS — Z961 Presence of intraocular lens: Secondary | ICD-10-CM | POA: Diagnosis not present

## 2020-01-22 DIAGNOSIS — H401132 Primary open-angle glaucoma, bilateral, moderate stage: Secondary | ICD-10-CM | POA: Diagnosis not present

## 2020-02-22 DIAGNOSIS — J410 Simple chronic bronchitis: Secondary | ICD-10-CM | POA: Diagnosis not present

## 2020-02-22 DIAGNOSIS — I10 Essential (primary) hypertension: Secondary | ICD-10-CM | POA: Diagnosis not present

## 2020-02-22 DIAGNOSIS — E782 Mixed hyperlipidemia: Secondary | ICD-10-CM | POA: Diagnosis not present

## 2020-02-22 DIAGNOSIS — I2510 Atherosclerotic heart disease of native coronary artery without angina pectoris: Secondary | ICD-10-CM | POA: Diagnosis not present

## 2020-02-22 DIAGNOSIS — E119 Type 2 diabetes mellitus without complications: Secondary | ICD-10-CM | POA: Diagnosis not present

## 2020-02-22 DIAGNOSIS — Z720 Tobacco use: Secondary | ICD-10-CM | POA: Diagnosis not present

## 2020-05-10 ENCOUNTER — Ambulatory Visit: Payer: PPO | Admitting: Podiatry

## 2020-05-27 DIAGNOSIS — H401132 Primary open-angle glaucoma, bilateral, moderate stage: Secondary | ICD-10-CM | POA: Diagnosis not present

## 2020-05-27 DIAGNOSIS — H04123 Dry eye syndrome of bilateral lacrimal glands: Secondary | ICD-10-CM | POA: Diagnosis not present

## 2020-05-27 DIAGNOSIS — H43822 Vitreomacular adhesion, left eye: Secondary | ICD-10-CM | POA: Diagnosis not present

## 2020-05-27 DIAGNOSIS — H16223 Keratoconjunctivitis sicca, not specified as Sjogren's, bilateral: Secondary | ICD-10-CM | POA: Diagnosis not present

## 2020-05-27 DIAGNOSIS — H35033 Hypertensive retinopathy, bilateral: Secondary | ICD-10-CM | POA: Diagnosis not present

## 2020-05-27 DIAGNOSIS — H47011 Ischemic optic neuropathy, right eye: Secondary | ICD-10-CM | POA: Diagnosis not present

## 2020-05-27 DIAGNOSIS — Z961 Presence of intraocular lens: Secondary | ICD-10-CM | POA: Diagnosis not present

## 2020-06-05 ENCOUNTER — Ambulatory Visit: Payer: PPO | Admitting: Vascular Surgery

## 2020-06-05 ENCOUNTER — Encounter: Payer: Self-pay | Admitting: Vascular Surgery

## 2020-06-05 ENCOUNTER — Ambulatory Visit (HOSPITAL_COMMUNITY)
Admission: RE | Admit: 2020-06-05 | Discharge: 2020-06-05 | Disposition: A | Discharge status: Home / Self Care | Payer: PPO | Source: Ambulatory Visit | Attending: Vascular Surgery | Admitting: Vascular Surgery

## 2020-06-05 ENCOUNTER — Other Ambulatory Visit: Payer: Self-pay

## 2020-06-05 ENCOUNTER — Ambulatory Visit (INDEPENDENT_AMBULATORY_CARE_PROVIDER_SITE_OTHER)
Admission: RE | Admit: 2020-06-05 | Discharge: 2020-06-05 | Disposition: A | Discharge status: Home / Self Care | Payer: PPO | Source: Ambulatory Visit | Attending: Vascular Surgery | Admitting: Vascular Surgery

## 2020-06-05 VITALS — BP 130/80 | HR 86 | Temp 98.3°F | Resp 20 | Ht 64.0 in | Wt 133.0 lb

## 2020-06-05 DIAGNOSIS — I739 Peripheral vascular disease, unspecified: Secondary | ICD-10-CM | POA: Insufficient documentation

## 2020-06-05 DIAGNOSIS — R0989 Other specified symptoms and signs involving the circulatory and respiratory systems: Secondary | ICD-10-CM | POA: Diagnosis not present

## 2020-06-05 DIAGNOSIS — I6521 Occlusion and stenosis of right carotid artery: Secondary | ICD-10-CM | POA: Diagnosis not present

## 2020-06-05 NOTE — Progress Notes (Signed)
REASON FOR VISIT:   Follow-up of peripheral vascular disease and right carotid stenosis  MEDICAL ISSUES:   PERIPHERAL VASCULAR DISEASE: This patient has stable claudication.  He has multilevel arterial occlusive disease bilaterally.  I encouraged him to stay as active as possible.  We have again discussed the importance of tobacco cessation.  He is on aspirin and is on a statin.  We would only consider arteriography or CT angiogram if he developed rest pain or nonhealing ulcer.  Given his age certainly we do not want to be overly aggressive unless he develops critical limb ischemia.  I will see him back in 1 year.  I ordered follow-up ABIs at that time.  He knows to call sooner if he has problems.  RIGHT CAROTID STENOSIS: This patient has an asymptomatic 40 to 59% right carotid stenosis.  He is on aspirin and is on a statin.  We have discussed the importance of tobacco cessation.  He understands we would not consider right carotid endarterectomy unless he developed right hemispheric symptoms of the stenosis progressed to greater than 80%.  I ordered a follow-up carotid duplex scan in 1 year and I will see him at that time.   HPI:   Cristian Hayden is a pleasant 80 y.o. male who I last saw on 05/17/2019.  He is undergone a previous right left femorofemoral bypass graft by Dr. Madilyn Fireman in 2016.  This graft is chronically occluded.  When I saw him last he also had a right carotid bruit which prompted a duplex scan which showed a 40 to 59% right carotid stenosis.  There was no significant stenosis on the left.  The patient had stable claudication with multilevel arterial occlusive disease.  I encouraged him to stay as active as possible.  We discussed the importance of tobacco cessation.  He comes in for a 1 year follow-up visit.  Since I saw him last he has stable bilateral lower extremity claudication.  His symptoms are brought on by ambulation and relieved with rest.  His symptoms have been stable  over the last year.  He denies any history of rest pain or nonhealing ulcers.  He does continue to smoke about 2 cigarettes a day.  He is on aspirin and is on a statin.  He denies any history of stroke, TIAs, expressive or receptive aphasia, or amaurosis fugax.  Past Medical History:  Diagnosis Date  . Arthritis   . Bronchitis   . CAD (coronary artery disease)   . Diabetes mellitus without complication (HCC)   . Hyperlipidemia   . Hypertension   . Peripheral vascular disease (HCC)    with claludication  . PVD (peripheral vascular disease) (HCC)     Family History  Problem Relation Age of Onset  . Hypertension Mother   . Heart disease Father 59       CAD  . Hypertension Father   . Hypertension Sister   . Hypertension Brother     SOCIAL HISTORY: Social History   Tobacco Use  . Smoking status: Current Some Day Smoker    Packs/day: 0.20    Types: Cigarettes  . Smokeless tobacco: Never Used  . Tobacco comment: pt states he is trying, only smokes about 2-3 per day  Substance Use Topics  . Alcohol use: No    Comment: former    No Known Allergies  Current Outpatient Medications  Medication Sig Dispense Refill  . albuterol (VENTOLIN HFA) 108 (90 Base) MCG/ACT inhaler SMARTSIG:2 Puff(s) By  Mouth Every 6 Hours    . ALPHAGAN P 0.1 % SOLN INT 1 GTT LEY BID    . atorvastatin (LIPITOR) 40 MG tablet Take 40 mg by mouth daily.     . carvedilol (COREG) 6.25 MG tablet TAKE 1 TABLET(6.25 MG) BY MOUTH TWICE DAILY WITH A MEAL 90 tablet 2  . chlorthalidone (HYGROTON) 25 MG tablet TAKE 1 TABLET(25 MG) BY MOUTH DAILY 90 tablet 3  . dicyclomine (BENTYL) 10 MG capsule Take 1 capsule by mouth 3 (three) times daily.    . dorzolamide-timolol (COSOPT) 22.3-6.8 MG/ML ophthalmic solution Place 1 drop into both eyes 2 (two) times daily.  4  . latanoprost (XALATAN) 0.005 % ophthalmic solution Place 1 drop into both eyes at bedtime.  4  . LINZESS 290 MCG CAPS capsule TK 1 C PO QD ON AN EMPTY STOMACH  AT LEAST 30 MINUTES BEFORE 1ST MEAL OF THE DAY    . lisinopril (ZESTRIL) 20 MG tablet TAKE 1 TABLET(20 MG) BY MOUTH TWICE DAILY 180 tablet 3  . metFORMIN (GLUCOPHAGE) 500 MG tablet TK 1 T PO  BID WITH MORNING AND EVENING MEALS    . mirtazapine (REMERON) 15 MG tablet     . NOVOLOG FLEXPEN 100 UNIT/ML FlexPen SMARTSIG:2-15 Unit(s) IM 3 Times Daily    . pentoxifylline (TRENTAL) 400 MG CR tablet Take 400 mg by mouth 2 (two) times daily.  5  . polyethylene glycol (MIRALAX / GLYCOLAX) packet Take daily until having regular bowel movements 100 each 0  . potassium chloride SA (KLOR-CON) 20 MEQ tablet Take 20 mEq by mouth 2 (two) times daily.    . SYMBICORT 80-4.5 MCG/ACT inhaler     . tamsulosin (FLOMAX) 0.4 MG CAPS capsule Take 0.8 mg by mouth daily. Reported on 07/10/2015     No current facility-administered medications for this visit.    REVIEW OF SYSTEMS:  [X]  denotes positive finding, [ ]  denotes negative finding Cardiac  Comments:  Chest pain or chest pressure:    Shortness of breath upon exertion:    Short of breath when lying flat:    Irregular heart rhythm:        Vascular    Pain in calf, thigh, or hip brought on by ambulation: x   Pain in feet at night that wakes you up from your sleep:     Blood clot in your veins:    Leg swelling:         Pulmonary    Oxygen at home:    Productive cough:     Wheezing:         Neurologic    Sudden weakness in arms or legs:     Sudden numbness in arms or legs:     Sudden onset of difficulty speaking or slurred speech:    Temporary loss of vision in one eye:     Problems with dizziness:         Gastrointestinal    Blood in stool:     Vomited blood:         Genitourinary    Burning when urinating:     Blood in urine:        Psychiatric    Major depression:         Hematologic    Bleeding problems:    Problems with blood clotting too easily:        Skin    Rashes or ulcers:        Constitutional  Fever or chills:      PHYSICAL EXAM:   Vitals:   06/05/20 1425 06/05/20 1427  BP: 132/83 130/80  Pulse: 86   Resp: 20   Temp: 98.3 F (36.8 C)   SpO2: 96%   Weight: 133 lb (60.3 kg)   Height: 5\' 4"  (1.626 m)     GENERAL: The patient is a well-nourished male, in no acute distress. The vital signs are documented above. CARDIAC: There is a regular rate and rhythm.  VASCULAR: I do not detect carotid bruits. I cannot palpate femoral or pedal pulses. PULMONARY: There is good air exchange bilaterally without wheezing or rales. ABDOMEN: Soft and non-tender with normal pitched bowel sounds.  MUSCULOSKELETAL: There are no major deformities or cyanosis. NEUROLOGIC: No focal weakness or paresthesias are detected. SKIN: There are no ulcers or rashes noted. PSYCHIATRIC: The patient has a normal affect.  DATA:    CAROTID DUPLEX: I have independently interpreted his carotid duplex scan today.  On the right side he has a 40 to 59% stenosis.  The right vertebral artery is patent with antegrade flow.  On the left side there is a less than 39% stenosis.  The left vertebral artery is patent with antegrade flow.  ARTERIAL DOPPLER STUDY: I have independently interpreted his arterial Doppler study today.  On the right side there is a monophasic dorsalis pedis and posterior tibial signal.  ABI is 58%.  Toe pressures 54 mmHg.  On the left side there is a monophasic dorsalis pedis and posterior tibial signal.  ABIs 52%.  Toe pressures 53 mmHg.  [facility] [phone]

## 2020-06-18 ENCOUNTER — Encounter: Payer: Self-pay | Admitting: Podiatry

## 2020-06-18 ENCOUNTER — Other Ambulatory Visit: Payer: Self-pay

## 2020-06-18 ENCOUNTER — Ambulatory Visit (INDEPENDENT_AMBULATORY_CARE_PROVIDER_SITE_OTHER): Payer: PPO | Admitting: Podiatry

## 2020-06-18 DIAGNOSIS — M79675 Pain in left toe(s): Secondary | ICD-10-CM

## 2020-06-18 DIAGNOSIS — I739 Peripheral vascular disease, unspecified: Secondary | ICD-10-CM | POA: Diagnosis not present

## 2020-06-18 DIAGNOSIS — I7092 Chronic total occlusion of artery of the extremities: Secondary | ICD-10-CM

## 2020-06-18 DIAGNOSIS — M79674 Pain in right toe(s): Secondary | ICD-10-CM

## 2020-06-18 DIAGNOSIS — E1142 Type 2 diabetes mellitus with diabetic polyneuropathy: Secondary | ICD-10-CM

## 2020-06-18 DIAGNOSIS — E1140 Type 2 diabetes mellitus with diabetic neuropathy, unspecified: Secondary | ICD-10-CM | POA: Insufficient documentation

## 2020-06-18 DIAGNOSIS — B351 Tinea unguium: Secondary | ICD-10-CM | POA: Insufficient documentation

## 2020-06-18 NOTE — Progress Notes (Signed)
This patient returns to my office for at risk foot care.  This patient requires this care by a professional since this patient will be at risk due to having diabetes mellitus and pvd.  He presents to the officew with male caregiver.  This patient is unable to cut nails himself since the patient cannot reach his nails.These nails are painful walking and wearing shoes.  This patient presents for at risk foot care today.  General Appearance  Alert, conversant and in no acute stress.  Vascular  Dorsalis pedis and posterior tibial  pulses are weakly  palpable  bilaterally.  Capillary return is within normal limits  bilaterally. Cold feet  Bilaterally. Absent digital hair  B/L.  Neurologic  Senn-Weinstein monofilament wire test within normal limits  bilaterally. Muscle power within normal limits bilaterally.  Nails Thick disfigured discolored nails with subungual debris  from hallux to fifth toes bilaterally. No evidence of bacterial infection or drainage bilaterally.  Orthopedic  No limitations of motion  feet .  No crepitus or effusions noted.  No bony pathology or digital deformities noted.  Skin  normotropic skin with no porokeratosis noted bilaterally.  No signs of infections or ulcers noted.     Onychomycosis  Pain in right toes  Pain in left toes  Consent was obtained for treatment procedures.   Mechanical debridement of nails 1-5  bilaterally performed with a nail nipper.  Filed with dremel without incident.    Return office visit     3 months                 Told patient to return for periodic foot care and evaluation due to potential at risk complications.   Deacon Gadbois DPM  

## 2020-06-29 ENCOUNTER — Other Ambulatory Visit: Payer: Self-pay | Admitting: Cardiology

## 2020-07-14 ENCOUNTER — Other Ambulatory Visit: Payer: Self-pay | Admitting: Cardiology

## 2020-07-18 DIAGNOSIS — E782 Mixed hyperlipidemia: Secondary | ICD-10-CM | POA: Diagnosis not present

## 2020-07-18 DIAGNOSIS — Z125 Encounter for screening for malignant neoplasm of prostate: Secondary | ICD-10-CM | POA: Diagnosis not present

## 2020-07-18 DIAGNOSIS — I10 Essential (primary) hypertension: Secondary | ICD-10-CM | POA: Diagnosis not present

## 2020-07-18 DIAGNOSIS — E119 Type 2 diabetes mellitus without complications: Secondary | ICD-10-CM | POA: Diagnosis not present

## 2020-07-18 DIAGNOSIS — Z720 Tobacco use: Secondary | ICD-10-CM | POA: Diagnosis not present

## 2020-07-18 DIAGNOSIS — I2510 Atherosclerotic heart disease of native coronary artery without angina pectoris: Secondary | ICD-10-CM | POA: Diagnosis not present

## 2020-07-18 DIAGNOSIS — J410 Simple chronic bronchitis: Secondary | ICD-10-CM | POA: Diagnosis not present

## 2020-07-30 DIAGNOSIS — R399 Unspecified symptoms and signs involving the genitourinary system: Secondary | ICD-10-CM | POA: Diagnosis not present

## 2020-07-30 DIAGNOSIS — I129 Hypertensive chronic kidney disease with stage 1 through stage 4 chronic kidney disease, or unspecified chronic kidney disease: Secondary | ICD-10-CM | POA: Diagnosis not present

## 2020-07-30 DIAGNOSIS — N1832 Chronic kidney disease, stage 3b: Secondary | ICD-10-CM | POA: Diagnosis not present

## 2020-08-02 ENCOUNTER — Other Ambulatory Visit: Payer: Self-pay | Admitting: Nephrology

## 2020-08-02 DIAGNOSIS — R399 Unspecified symptoms and signs involving the genitourinary system: Secondary | ICD-10-CM

## 2020-08-02 DIAGNOSIS — N1832 Chronic kidney disease, stage 3b: Secondary | ICD-10-CM

## 2020-08-02 DIAGNOSIS — I129 Hypertensive chronic kidney disease with stage 1 through stage 4 chronic kidney disease, or unspecified chronic kidney disease: Secondary | ICD-10-CM

## 2020-08-06 ENCOUNTER — Ambulatory Visit
Admission: RE | Admit: 2020-08-06 | Discharge: 2020-08-06 | Disposition: A | Discharge status: Home / Self Care | Payer: PPO | Source: Ambulatory Visit | Attending: Nephrology | Admitting: Nephrology

## 2020-08-06 DIAGNOSIS — N1832 Chronic kidney disease, stage 3b: Secondary | ICD-10-CM

## 2020-08-06 DIAGNOSIS — R399 Unspecified symptoms and signs involving the genitourinary system: Secondary | ICD-10-CM

## 2020-08-06 DIAGNOSIS — N281 Cyst of kidney, acquired: Secondary | ICD-10-CM | POA: Diagnosis not present

## 2020-08-06 DIAGNOSIS — I129 Hypertensive chronic kidney disease with stage 1 through stage 4 chronic kidney disease, or unspecified chronic kidney disease: Secondary | ICD-10-CM

## 2020-08-07 DIAGNOSIS — K921 Melena: Secondary | ICD-10-CM | POA: Diagnosis not present

## 2020-08-07 DIAGNOSIS — I10 Essential (primary) hypertension: Secondary | ICD-10-CM | POA: Diagnosis not present

## 2020-08-07 DIAGNOSIS — J410 Simple chronic bronchitis: Secondary | ICD-10-CM | POA: Diagnosis not present

## 2020-08-07 DIAGNOSIS — Z720 Tobacco use: Secondary | ICD-10-CM | POA: Diagnosis not present

## 2020-08-07 DIAGNOSIS — I2510 Atherosclerotic heart disease of native coronary artery without angina pectoris: Secondary | ICD-10-CM | POA: Diagnosis not present

## 2020-08-07 DIAGNOSIS — R300 Dysuria: Secondary | ICD-10-CM | POA: Diagnosis not present

## 2020-08-07 DIAGNOSIS — E782 Mixed hyperlipidemia: Secondary | ICD-10-CM | POA: Diagnosis not present

## 2020-08-07 DIAGNOSIS — E119 Type 2 diabetes mellitus without complications: Secondary | ICD-10-CM | POA: Diagnosis not present

## 2020-08-14 ENCOUNTER — Encounter: Payer: Self-pay | Admitting: Physician Assistant

## 2020-08-29 ENCOUNTER — Ambulatory Visit: Payer: Medicare HMO | Admitting: Physician Assistant

## 2020-08-29 ENCOUNTER — Other Ambulatory Visit: Payer: Self-pay

## 2020-08-29 ENCOUNTER — Encounter: Payer: Self-pay | Admitting: Physician Assistant

## 2020-08-29 VITALS — BP 110/80 | HR 68 | Ht 64.0 in | Wt 128.0 lb

## 2020-08-29 DIAGNOSIS — K5909 Other constipation: Secondary | ICD-10-CM | POA: Diagnosis not present

## 2020-08-29 DIAGNOSIS — R634 Abnormal weight loss: Secondary | ICD-10-CM

## 2020-08-29 DIAGNOSIS — K625 Hemorrhage of anus and rectum: Secondary | ICD-10-CM

## 2020-08-29 NOTE — Progress Notes (Signed)
Reviewed. Hopefully he will change his mind about endoscopic evaluation, particularly given the recent blood in the stool.   Addisyn Leclaire L. Orvan Falconer, MD, MPH

## 2020-08-29 NOTE — Progress Notes (Signed)
Chief Complaint: Blood in stool  HPI:    [name] is a 80 year old African-American male with a past medical history as listed below including CAD, PVD and others, who was referred to me by Norm Salt, PA for a complaint of blood in stool.      08/07/2020 patient seen by PCP and described that he had seen some bright red blood in his stool a couple of days prior.  He was sent to our clinic for further evaluation.    Today, the patient tells me that he only saw blood one time and it was just a little bit of bright red blood on the toilet paper.  He used Hydrocortisone suppositories as given by his PCP and has seen no blood since.  Tells me he has a chronic history of constipation and typically uses a laxative every 3 to 5 days.  This is something he has done his whole life and is very comfortable with it.  Explains that he did have a colonoscopy he thinks it was back in the "2000's", not sure exactly where it was or what the results were.  Does describe a decrease in appetite and some weight loss, about 30 pounds over the past couple of years, this is somewhat concerning to him.  Denies any other GI complaints today.    Denies fever, chills, change in bowel habits, heartburn, reflux, nausea or vomiting.  Past Medical History:  Diagnosis Date  . Arthritis   . Bronchitis   . CAD (coronary artery disease)   . Diabetes mellitus without complication (HCC)   . Hyperlipidemia   . Hypertension   . Peripheral vascular disease (HCC)    with claludication  . PVD (peripheral vascular disease) (HCC)     Past Surgical History:  Procedure Laterality Date  . CATARACT EXTRACTION  June 2013   Cataract Right eye  . CORONARY ARTERY BYPASS GRAFT     LIMA to LAD, SVG to PDA, SVG to OM1/OM2  . Femoral-Femoral BPGA  05/29/1999   Left profundoplasty  and Left Fem/Pop 05/28/2008    Current Outpatient Medications  Medication Sig Dispense Refill  . ALPHAGAN P 0.1 % SOLN INT 1 GTT LEY BID    .  atorvastatin (LIPITOR) 40 MG tablet Take 40 mg by mouth daily.     . BD PEN NEEDLE NANO 2ND GEN 32G X 4 MM MISC See admin instructions.    . carvedilol (COREG) 6.25 MG tablet TAKE 1 TABLET(6.25 MG) BY MOUTH TWICE DAILY WITH A MEAL 180 tablet 0  . chlorthalidone (HYGROTON) 25 MG tablet TAKE 1 TABLET(25 MG) BY MOUTH DAILY 90 tablet 3  . dorzolamide-timolol (COSOPT) 22.3-6.8 MG/ML ophthalmic solution Place 1 drop into both eyes 2 (two) times daily.  4  . Lancets (ONETOUCH DELICA PLUS LANCET30G) MISC 4 (four) times daily. for testing    . latanoprost (XALATAN) 0.005 % ophthalmic solution Place 1 drop into both eyes at bedtime.  4  . metFORMIN (GLUCOPHAGE) 500 MG tablet TK 1 T PO  BID WITH MORNING AND EVENING MEALS    . NOVOLOG FLEXPEN 100 UNIT/ML FlexPen SMARTSIG:2-15 Unit(s) IM 3 Times Daily    . Polyethyl Glycol-Propyl Glycol (SYSTANE OP) Apply to eye.    . tamsulosin (FLOMAX) 0.4 MG CAPS capsule Take 0.8 mg by mouth daily. Reported on 07/10/2015     No current facility-administered medications for this visit.    Allergies as of 08/29/2020  . (No Known Allergies)    Family History  Problem Relation Age of Onset  . Hypertension Mother   . Heart disease Father 66       CAD  . Hypertension Father   . Hypertension Sister   . Hypertension Brother     Social History   Socioeconomic History  . Marital status: Married    Spouse name: Not on file  . Number of children: 4  . Years of education: Not on file  . Highest education level: Not on file  Occupational History  . Not on file  Tobacco Use  . Smoking status: Current Some Day Smoker    Packs/day: 0.20    Types: Cigarettes  . Smokeless tobacco: Never Used  . Tobacco comment: pt states he is trying, only smokes about 2-3 per day  Vaping Use  . Vaping Use: Never used  Substance and Sexual Activity  . Alcohol use: No    Comment: former  . Drug use: No  . Sexual activity: Not on file  Other Topics Concern  . Not on file   Social History Narrative   Lives with wife.     Social Determinants of Health   Financial Resource Strain: Not on file  Food Insecurity: Not on file  Transportation Needs: Not on file  Physical Activity: Not on file  Stress: Not on file  Social Connections: Not on file  Intimate Partner Violence: Not on file    Review of Systems:    Constitutional: No weight loss, fever or chills Skin: No rash  Cardiovascular: No chest pain  Respiratory: No SOB  Gastrointestinal: See HPI and otherwise negative Genitourinary: No dysuria Neurological: No headache, dizziness or syncope Musculoskeletal: No new muscle or joint pain Hematologic: No bruising Psychiatric: No history of depression or anxiety   Physical Exam:  Vital signs: BP 110/80   Pulse 68   Ht 5\' 4"  (1.626 m)   Wt 128 lb (58.1 kg)   BMI 21.97 kg/m   Constitutional:   Pleasant Elderly AA male appears to be in NAD, Well developed, Well nourished, alert and cooperative Head:  Normocephalic and atraumatic. Eyes:   PEERL, EOMI. No icterus. Conjunctiva pink. Ears:  Normal auditory acuity. Neck:  Supple Throat: Oral cavity and pharynx without inflammation, swelling or lesion.  Respiratory: Respirations even and unlabored. Lungs clear to auscultation bilaterally.   No wheezes, crackles, or rhonchi.  Cardiovascular: Normal S1, S2. No MRG. Regular rate and rhythm. No peripheral edema, cyanosis or pallor.  Gastrointestinal:  Soft, nondistended, nontender. No rebound or guarding. Normal bowel sounds. No appreciable masses or hepatomegaly. Rectal: Declined Msk:  Symmetrical without gross deformities. Without edema, no deformity or joint abnormality.  Neurologic:  Alert and  oriented x4;  grossly normal neurologically.  Skin:   Dry and intact without significant lesions or rashes. Psychiatric: Demonstrates good judgement and reason without abnormal affect or behaviors.  No recent labs or imaging.  Assessment: 1.  Rectal bleeding:  1 episode of rectal bleeding about a month and a half ago, none since, history of constipation, given Hydrocortisone at the time; most likely hemorrhoids 2.  Weight loss: About 30 pounds over the past couple of years with the described decrease in appetite; consider gastritis versus aging versus cancer versus other  Plan: 1.  Discussed options for further evaluation of weight loss with the patient including EGD/colonoscopy versus CT of the abdomen and pelvis.  Patient declines all work-up today.  He tells me he will call back if he decides that he would like something done.  We did discuss there is a possibility that he has a cancer which is causing his weight loss.  He verbalized understanding. 2.  Discussed other options to control constipation, but patient is happy with his current regimen. 3.  Patient was assigned to Dr. Orvan Falconer today and will return to clinic as needed.  Hyacinth Meeker, PA-C [facility] 08/29/2020, 8:36 AM  Cc: Norm Salt, PA

## 2020-08-29 NOTE — Patient Instructions (Signed)
If you are age 80 or older, your body mass index should be between 23-30. Your Body mass index is 21.97 kg/m. If this is out of the aforementioned range listed, please consider follow up with your Primary Care Provider.  If you are age 35 or younger, your body mass index should be between 19-25. Your Body mass index is 21.97 kg/m. If this is out of the aformentioned range listed, please consider follow up with your Primary Care Provider.   Call us back if you would like [place] to work up the weight loss.  Try supplementing with 2-3 Boost/Ensure/Carabun breakfast shakes per day.  Follow up as needed.  Thank you for choosing me and [facility].  Hyacinth Meeker, PA-C

## 2020-09-04 DIAGNOSIS — Z794 Long term (current) use of insulin: Secondary | ICD-10-CM | POA: Diagnosis not present

## 2020-09-04 DIAGNOSIS — E1165 Type 2 diabetes mellitus with hyperglycemia: Secondary | ICD-10-CM | POA: Diagnosis not present

## 2020-09-04 DIAGNOSIS — H409 Unspecified glaucoma: Secondary | ICD-10-CM | POA: Diagnosis not present

## 2020-09-04 DIAGNOSIS — E785 Hyperlipidemia, unspecified: Secondary | ICD-10-CM | POA: Diagnosis not present

## 2020-09-04 DIAGNOSIS — I2510 Atherosclerotic heart disease of native coronary artery without angina pectoris: Secondary | ICD-10-CM | POA: Diagnosis not present

## 2020-09-04 DIAGNOSIS — N529 Male erectile dysfunction, unspecified: Secondary | ICD-10-CM | POA: Diagnosis not present

## 2020-09-04 DIAGNOSIS — E1142 Type 2 diabetes mellitus with diabetic polyneuropathy: Secondary | ICD-10-CM | POA: Diagnosis not present

## 2020-09-04 DIAGNOSIS — I10 Essential (primary) hypertension: Secondary | ICD-10-CM | POA: Diagnosis not present

## 2020-09-04 DIAGNOSIS — N400 Enlarged prostate without lower urinary tract symptoms: Secondary | ICD-10-CM | POA: Diagnosis not present

## 2020-09-04 DIAGNOSIS — Z720 Tobacco use: Secondary | ICD-10-CM | POA: Diagnosis not present

## 2020-09-09 DIAGNOSIS — I129 Hypertensive chronic kidney disease with stage 1 through stage 4 chronic kidney disease, or unspecified chronic kidney disease: Secondary | ICD-10-CM | POA: Diagnosis not present

## 2020-09-09 DIAGNOSIS — N1832 Chronic kidney disease, stage 3b: Secondary | ICD-10-CM | POA: Diagnosis not present

## 2020-09-09 DIAGNOSIS — R399 Unspecified symptoms and signs involving the genitourinary system: Secondary | ICD-10-CM | POA: Diagnosis not present

## 2020-09-18 ENCOUNTER — Ambulatory Visit (INDEPENDENT_AMBULATORY_CARE_PROVIDER_SITE_OTHER): Payer: Medicare HMO | Admitting: Podiatry

## 2020-09-18 ENCOUNTER — Other Ambulatory Visit: Payer: Self-pay

## 2020-09-18 ENCOUNTER — Encounter: Payer: Self-pay | Admitting: Podiatry

## 2020-09-18 DIAGNOSIS — B351 Tinea unguium: Secondary | ICD-10-CM | POA: Diagnosis not present

## 2020-09-18 DIAGNOSIS — J410 Simple chronic bronchitis: Secondary | ICD-10-CM | POA: Insufficient documentation

## 2020-09-18 DIAGNOSIS — E1142 Type 2 diabetes mellitus with diabetic polyneuropathy: Secondary | ICD-10-CM | POA: Diagnosis not present

## 2020-09-18 DIAGNOSIS — M79675 Pain in left toe(s): Secondary | ICD-10-CM | POA: Diagnosis not present

## 2020-09-18 DIAGNOSIS — I739 Peripheral vascular disease, unspecified: Secondary | ICD-10-CM | POA: Diagnosis not present

## 2020-09-18 DIAGNOSIS — E119 Type 2 diabetes mellitus without complications: Secondary | ICD-10-CM | POA: Insufficient documentation

## 2020-09-18 DIAGNOSIS — M79674 Pain in right toe(s): Secondary | ICD-10-CM | POA: Diagnosis not present

## 2020-09-18 DIAGNOSIS — E782 Mixed hyperlipidemia: Secondary | ICD-10-CM | POA: Insufficient documentation

## 2020-09-18 DIAGNOSIS — E785 Hyperlipidemia, unspecified: Secondary | ICD-10-CM | POA: Insufficient documentation

## 2020-09-18 NOTE — Progress Notes (Signed)
This patient returns to my office for at risk foot care.  This patient requires this care by a professional since this patient will be at risk due to having diabetes mellitus and pvd.  He presents to the officew with male caregiver.  This patient is unable to cut nails himself since the patient cannot reach his nails.These nails are painful walking and wearing shoes.  This patient presents for at risk foot care today.  General Appearance  Alert, conversant and in no acute stress.  Vascular  Dorsalis pedis and posterior tibial  pulses are weakly  palpable  bilaterally.  Capillary return is within normal limits  bilaterally. Cold feet  Bilaterally. Absent digital hair  B/L.  Neurologic  Senn-Weinstein monofilament wire test within normal limits  bilaterally. Muscle power within normal limits bilaterally.  Nails Thick disfigured discolored nails with subungual debris  from hallux to fifth toes bilaterally. No evidence of bacterial infection or drainage bilaterally.  Orthopedic  No limitations of motion  feet .  No crepitus or effusions noted.  No bony pathology or digital deformities noted.  Skin  normotropic skin with no porokeratosis noted bilaterally.  No signs of infections or ulcers noted.     Onychomycosis  Pain in right toes  Pain in left toes  Consent was obtained for treatment procedures.   Mechanical debridement of nails 1-5  bilaterally performed with a nail nipper.  Filed with dremel without incident.    Return office visit     3 months                 Told patient to return for periodic foot care and evaluation due to potential at risk complications.   Reathel Turi DPM  

## 2020-11-14 DIAGNOSIS — Z720 Tobacco use: Secondary | ICD-10-CM | POA: Diagnosis not present

## 2020-11-14 DIAGNOSIS — I10 Essential (primary) hypertension: Secondary | ICD-10-CM | POA: Diagnosis not present

## 2020-11-14 DIAGNOSIS — I2510 Atherosclerotic heart disease of native coronary artery without angina pectoris: Secondary | ICD-10-CM | POA: Diagnosis not present

## 2020-11-14 DIAGNOSIS — J410 Simple chronic bronchitis: Secondary | ICD-10-CM | POA: Diagnosis not present

## 2020-11-14 DIAGNOSIS — E119 Type 2 diabetes mellitus without complications: Secondary | ICD-10-CM | POA: Diagnosis not present

## 2020-11-14 DIAGNOSIS — E782 Mixed hyperlipidemia: Secondary | ICD-10-CM | POA: Diagnosis not present

## 2020-12-16 DIAGNOSIS — Z720 Tobacco use: Secondary | ICD-10-CM | POA: Diagnosis not present

## 2020-12-16 DIAGNOSIS — J410 Simple chronic bronchitis: Secondary | ICD-10-CM | POA: Diagnosis not present

## 2020-12-16 DIAGNOSIS — E782 Mixed hyperlipidemia: Secondary | ICD-10-CM | POA: Diagnosis not present

## 2020-12-16 DIAGNOSIS — E119 Type 2 diabetes mellitus without complications: Secondary | ICD-10-CM | POA: Diagnosis not present

## 2020-12-16 DIAGNOSIS — M778 Other enthesopathies, not elsewhere classified: Secondary | ICD-10-CM | POA: Diagnosis not present

## 2020-12-16 DIAGNOSIS — I2510 Atherosclerotic heart disease of native coronary artery without angina pectoris: Secondary | ICD-10-CM | POA: Diagnosis not present

## 2020-12-16 DIAGNOSIS — I10 Essential (primary) hypertension: Secondary | ICD-10-CM | POA: Diagnosis not present

## 2020-12-20 ENCOUNTER — Ambulatory Visit: Payer: Medicare HMO | Admitting: Podiatry

## 2020-12-25 ENCOUNTER — Encounter: Payer: Self-pay | Admitting: Podiatry

## 2020-12-25 ENCOUNTER — Other Ambulatory Visit: Payer: Self-pay

## 2020-12-25 ENCOUNTER — Ambulatory Visit (INDEPENDENT_AMBULATORY_CARE_PROVIDER_SITE_OTHER): Payer: Medicare HMO | Admitting: Podiatry

## 2020-12-25 DIAGNOSIS — M79675 Pain in left toe(s): Secondary | ICD-10-CM

## 2020-12-25 DIAGNOSIS — B351 Tinea unguium: Secondary | ICD-10-CM

## 2020-12-25 DIAGNOSIS — I739 Peripheral vascular disease, unspecified: Secondary | ICD-10-CM

## 2020-12-25 DIAGNOSIS — M79674 Pain in right toe(s): Secondary | ICD-10-CM

## 2020-12-25 DIAGNOSIS — E1142 Type 2 diabetes mellitus with diabetic polyneuropathy: Secondary | ICD-10-CM | POA: Diagnosis not present

## 2020-12-25 NOTE — Progress Notes (Signed)
This patient returns to my office for at risk foot care.  This patient requires this care by a professional since this patient will be at risk due to having diabetes mellitus and pvd.  He presents to the officew with male caregiver.  This patient is unable to cut nails himself since the patient cannot reach his nails.These nails are painful walking and wearing shoes.  This patient presents for at risk foot care today.  General Appearance  Alert, conversant and in no acute stress.  Vascular  Dorsalis pedis and posterior tibial  pulses are weakly  palpable  bilaterally.  Capillary return is within normal limits  bilaterally. Cold feet  Bilaterally. Absent digital hair  B/L.  Neurologic  Senn-Weinstein monofilament wire test within normal limits  bilaterally. Muscle power within normal limits bilaterally.  Nails Thick disfigured discolored nails with subungual debris  from hallux to fifth toes bilaterally. No evidence of bacterial infection or drainage bilaterally.  Orthopedic  No limitations of motion  feet .  No crepitus or effusions noted.  No bony pathology or digital deformities noted.  Skin  normotropic skin with no porokeratosis noted bilaterally.  No signs of infections or ulcers noted.     Onychomycosis  Pain in right toes  Pain in left toes  Consent was obtained for treatment procedures.   Mechanical debridement of nails 1-5  bilaterally performed with a nail nipper.  Filed with dremel without incident.    Return office visit     3 months                 Told patient to return for periodic foot care and evaluation due to potential at risk complications.   Teigen Parslow DPM  

## 2020-12-27 ENCOUNTER — Emergency Department (HOSPITAL_COMMUNITY): Payer: Medicare HMO

## 2020-12-27 ENCOUNTER — Encounter (HOSPITAL_COMMUNITY): Payer: Self-pay

## 2020-12-27 ENCOUNTER — Emergency Department (HOSPITAL_COMMUNITY)
Admission: EM | Admit: 2020-12-27 | Discharge: 2020-12-27 | Disposition: A | Discharge status: Home / Self Care | Payer: Medicare HMO | Attending: Emergency Medicine | Admitting: Emergency Medicine

## 2020-12-27 ENCOUNTER — Other Ambulatory Visit: Payer: Self-pay

## 2020-12-27 DIAGNOSIS — I2510 Atherosclerotic heart disease of native coronary artery without angina pectoris: Secondary | ICD-10-CM | POA: Diagnosis not present

## 2020-12-27 DIAGNOSIS — Z20822 Contact with and (suspected) exposure to covid-19: Secondary | ICD-10-CM | POA: Diagnosis not present

## 2020-12-27 DIAGNOSIS — I10 Essential (primary) hypertension: Secondary | ICD-10-CM | POA: Insufficient documentation

## 2020-12-27 DIAGNOSIS — R918 Other nonspecific abnormal finding of lung field: Secondary | ICD-10-CM | POA: Diagnosis not present

## 2020-12-27 DIAGNOSIS — R202 Paresthesia of skin: Secondary | ICD-10-CM | POA: Diagnosis not present

## 2020-12-27 DIAGNOSIS — Z794 Long term (current) use of insulin: Secondary | ICD-10-CM | POA: Diagnosis not present

## 2020-12-27 DIAGNOSIS — I951 Orthostatic hypotension: Secondary | ICD-10-CM | POA: Diagnosis not present

## 2020-12-27 DIAGNOSIS — E1140 Type 2 diabetes mellitus with diabetic neuropathy, unspecified: Secondary | ICD-10-CM | POA: Diagnosis not present

## 2020-12-27 DIAGNOSIS — R7989 Other specified abnormal findings of blood chemistry: Secondary | ICD-10-CM

## 2020-12-27 DIAGNOSIS — B999 Unspecified infectious disease: Secondary | ICD-10-CM | POA: Diagnosis not present

## 2020-12-27 DIAGNOSIS — Z951 Presence of aortocoronary bypass graft: Secondary | ICD-10-CM | POA: Diagnosis not present

## 2020-12-27 DIAGNOSIS — Z7984 Long term (current) use of oral hypoglycemic drugs: Secondary | ICD-10-CM | POA: Insufficient documentation

## 2020-12-27 DIAGNOSIS — R200 Anesthesia of skin: Secondary | ICD-10-CM | POA: Diagnosis not present

## 2020-12-27 DIAGNOSIS — R9431 Abnormal electrocardiogram [ECG] [EKG]: Secondary | ICD-10-CM | POA: Diagnosis not present

## 2020-12-27 DIAGNOSIS — R42 Dizziness and giddiness: Secondary | ICD-10-CM | POA: Diagnosis not present

## 2020-12-27 DIAGNOSIS — R52 Pain, unspecified: Secondary | ICD-10-CM

## 2020-12-27 DIAGNOSIS — M795 Residual foreign body in soft tissue: Secondary | ICD-10-CM | POA: Diagnosis not present

## 2020-12-27 DIAGNOSIS — Z79899 Other long term (current) drug therapy: Secondary | ICD-10-CM | POA: Insufficient documentation

## 2020-12-27 DIAGNOSIS — R748 Abnormal levels of other serum enzymes: Secondary | ICD-10-CM | POA: Diagnosis not present

## 2020-12-27 DIAGNOSIS — F17210 Nicotine dependence, cigarettes, uncomplicated: Secondary | ICD-10-CM | POA: Insufficient documentation

## 2020-12-27 DIAGNOSIS — I6521 Occlusion and stenosis of right carotid artery: Secondary | ICD-10-CM | POA: Insufficient documentation

## 2020-12-27 LAB — URINALYSIS, ROUTINE W REFLEX MICROSCOPIC
Bilirubin Urine: NEGATIVE
Glucose, UA: NEGATIVE mg/dL
Hgb urine dipstick: NEGATIVE
Ketones, ur: NEGATIVE mg/dL
Leukocytes,Ua: NEGATIVE
Nitrite: NEGATIVE
Protein, ur: NEGATIVE mg/dL
Specific Gravity, Urine: 1.008 (ref 1.005–1.030)
pH: 6 (ref 5.0–8.0)

## 2020-12-27 LAB — COMPREHENSIVE METABOLIC PANEL
ALT: 9 U/L (ref 0–44)
AST: 20 U/L (ref 15–41)
Albumin: 3.5 g/dL (ref 3.5–5.0)
Alkaline Phosphatase: 67 U/L (ref 38–126)
Anion gap: 10 (ref 5–15)
BUN: 28 mg/dL — ABNORMAL HIGH (ref 8–23)
CO2: 21 mmol/L — ABNORMAL LOW (ref 22–32)
Calcium: 8.7 mg/dL — ABNORMAL LOW (ref 8.9–10.3)
Chloride: 102 mmol/L (ref 98–111)
Creatinine, Ser: 2.13 mg/dL — ABNORMAL HIGH (ref 0.61–1.24)
GFR, Estimated: 31 mL/min — ABNORMAL LOW (ref >60)
Glucose, Bld: 99 mg/dL (ref 70–99)
Potassium: 4.1 mmol/L (ref 3.5–5.1)
Sodium: 133 mmol/L — ABNORMAL LOW (ref 135–145)
Total Bilirubin: 0.4 mg/dL (ref 0.3–1.2)
Total Protein: 6.4 g/dL — ABNORMAL LOW (ref 6.5–8.1)

## 2020-12-27 LAB — PROTIME-INR
INR: 1.1 (ref 0.8–1.2)
Prothrombin Time: 13.7 seconds (ref 11.4–15.2)

## 2020-12-27 LAB — DIFFERENTIAL
Abs Immature Granulocytes: 0.02 10*3/uL (ref 0.00–0.07)
Basophils Absolute: 0 10*3/uL (ref 0.0–0.1)
Basophils Relative: 0 %
Eosinophils Absolute: 0.1 10*3/uL (ref 0.0–0.5)
Eosinophils Relative: 1 %
Immature Granulocytes: 0 %
Lymphocytes Relative: 34 %
Lymphs Abs: 2 10*3/uL (ref 0.7–4.0)
Monocytes Absolute: 0.7 10*3/uL (ref 0.1–1.0)
Monocytes Relative: 12 %
Neutro Abs: 3.1 10*3/uL (ref 1.7–7.7)
Neutrophils Relative %: 53 %

## 2020-12-27 LAB — CBC
HCT: 33 % — ABNORMAL LOW (ref 39.0–52.0)
Hemoglobin: 10.5 g/dL — ABNORMAL LOW (ref 13.0–17.0)
MCH: 29.7 pg (ref 26.0–34.0)
MCHC: 31.8 g/dL (ref 30.0–36.0)
MCV: 93.2 fL (ref 80.0–100.0)
Platelets: 176 10*3/uL (ref 150–400)
RBC: 3.54 MIL/uL — ABNORMAL LOW (ref 4.22–5.81)
RDW: 14.2 % (ref 11.5–15.5)
WBC: 5.9 10*3/uL (ref 4.0–10.5)
nRBC: 0 % (ref 0.0–0.2)

## 2020-12-27 LAB — I-STAT CHEM 8, ED
BUN: 29 mg/dL — ABNORMAL HIGH (ref 8–23)
Calcium, Ion: 1.15 mmol/L (ref 1.15–1.40)
Chloride: 104 mmol/L (ref 98–111)
Creatinine, Ser: 2.2 mg/dL — ABNORMAL HIGH (ref 0.61–1.24)
Glucose, Bld: 98 mg/dL (ref 70–99)
HCT: 33 % — ABNORMAL LOW (ref 39.0–52.0)
Hemoglobin: 11.2 g/dL — ABNORMAL LOW (ref 13.0–17.0)
Potassium: 4.3 mmol/L (ref 3.5–5.1)
Sodium: 138 mmol/L (ref 135–145)
TCO2: 23 mmol/L (ref 22–32)

## 2020-12-27 LAB — RESP PANEL BY RT-PCR (FLU A&B, COVID) ARPGX2
Influenza A by PCR: NEGATIVE
Influenza B by PCR: NEGATIVE
SARS Coronavirus 2 by RT PCR: NEGATIVE

## 2020-12-27 LAB — CBG MONITORING, ED: Glucose-Capillary: 73 mg/dL (ref 70–99)

## 2020-12-27 LAB — TROPONIN I (HIGH SENSITIVITY)
Troponin I (High Sensitivity): 21 ng/L — ABNORMAL HIGH (ref <18)
Troponin I (High Sensitivity): 20 ng/L — ABNORMAL HIGH (ref <18)

## 2020-12-27 LAB — APTT: aPTT: 33 seconds (ref 24–36)

## 2020-12-27 MED ORDER — SODIUM CHLORIDE 0.9% FLUSH
3.0000 mL | Freq: Once | INTRAVENOUS | Status: AC
Start: 2020-12-27 — End: 2020-12-27
  Administered 2020-12-27: 3 mL via INTRAVENOUS

## 2020-12-27 MED ORDER — SODIUM CHLORIDE 0.9 % IV BOLUS
1000.0000 mL | Freq: Once | INTRAVENOUS | Status: AC
  Administered 2020-12-27: 1000 mL via INTRAVENOUS

## 2020-12-27 MED ORDER — IOHEXOL 350 MG/ML SOLN
50.0000 mL | Freq: Once | INTRAVENOUS | Status: AC | PRN
  Administered 2020-12-27: 50 mL via INTRAVENOUS
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
## 2020-12-27 NOTE — ED Notes (Signed)
Patient transported to MRI 

## 2020-12-27 NOTE — Discharge Instructions (Addendum)
This is a summary of your work-up.  Please keep this for your records and bring it to your doctor's office  1.) Dizziness -we found that you have orthostatic hypotension.  This means that your blood pressure drops when you go from lying down to standing up.  This is very likely due to dehydration, but is also a natural part of aging.  Please take your time when standing up, especially in the morning.  2.) Kidney function - your creatinine kidney level was high today.  The last time we have a record of this in our system was 2 years ago.  This may again be a sign of some dehydration or other issues.  You should follow-up with your primary care doctor for this issue.  Drink a lot of water for the next 2 weeks, and your doctor should recheck your kidney function  3.) Carotid stenosis -the CT scan of your neck today showed that you have some plaque buildup in the big artery of your neck.  You have an 80% blockage on the right side.  You can follow-up with your doctor for this, but may also need to see a vascular specialist.  I gave you a phone number for one.  Your CT scan of the brain did not show signs of a stroke.  You are not a candidate for an MRI of the brain because you have a small metal BB behind your eye on your xrays.

## 2020-12-27 NOTE — ED Triage Notes (Signed)
Pt reports he was getting dressed this morning and suddenly felt dizzy, had a near fall but was able to catch himself and sit down. Pt also reports right sided facial numbness since Wednesday. Denies numbness in his right arm or leg. No facial droop noted. Pt a.o

## 2021-01-01 ENCOUNTER — Other Ambulatory Visit: Payer: Self-pay | Admitting: Cardiology

## 2021-01-01 DIAGNOSIS — E782 Mixed hyperlipidemia: Secondary | ICD-10-CM | POA: Diagnosis not present

## 2021-01-01 DIAGNOSIS — J410 Simple chronic bronchitis: Secondary | ICD-10-CM | POA: Diagnosis not present

## 2021-01-01 DIAGNOSIS — Z720 Tobacco use: Secondary | ICD-10-CM | POA: Diagnosis not present

## 2021-01-01 DIAGNOSIS — I2510 Atherosclerotic heart disease of native coronary artery without angina pectoris: Secondary | ICD-10-CM | POA: Diagnosis not present

## 2021-01-01 DIAGNOSIS — I6521 Occlusion and stenosis of right carotid artery: Secondary | ICD-10-CM | POA: Diagnosis not present

## 2021-01-01 DIAGNOSIS — I10 Essential (primary) hypertension: Secondary | ICD-10-CM | POA: Diagnosis not present

## 2021-01-01 DIAGNOSIS — E119 Type 2 diabetes mellitus without complications: Secondary | ICD-10-CM | POA: Diagnosis not present

## 2021-01-08 ENCOUNTER — Encounter (INDEPENDENT_AMBULATORY_CARE_PROVIDER_SITE_OTHER): Payer: Medicare HMO | Admitting: Ophthalmology

## 2021-01-21 ENCOUNTER — Encounter (INDEPENDENT_AMBULATORY_CARE_PROVIDER_SITE_OTHER): Payer: Medicare HMO | Admitting: Ophthalmology

## 2021-01-22 DIAGNOSIS — I6521 Occlusion and stenosis of right carotid artery: Secondary | ICD-10-CM | POA: Diagnosis not present

## 2021-01-22 DIAGNOSIS — J410 Simple chronic bronchitis: Secondary | ICD-10-CM | POA: Diagnosis not present

## 2021-01-22 DIAGNOSIS — Z0000 Encounter for general adult medical examination without abnormal findings: Secondary | ICD-10-CM | POA: Diagnosis not present

## 2021-01-22 DIAGNOSIS — E782 Mixed hyperlipidemia: Secondary | ICD-10-CM | POA: Diagnosis not present

## 2021-01-22 DIAGNOSIS — E119 Type 2 diabetes mellitus without complications: Secondary | ICD-10-CM | POA: Diagnosis not present

## 2021-01-22 DIAGNOSIS — Z720 Tobacco use: Secondary | ICD-10-CM | POA: Diagnosis not present

## 2021-01-22 DIAGNOSIS — I2510 Atherosclerotic heart disease of native coronary artery without angina pectoris: Secondary | ICD-10-CM | POA: Diagnosis not present

## 2021-01-22 DIAGNOSIS — I10 Essential (primary) hypertension: Secondary | ICD-10-CM | POA: Diagnosis not present

## 2021-01-23 ENCOUNTER — Ambulatory Visit: Payer: Medicare HMO | Admitting: Vascular Surgery

## 2021-01-31 DIAGNOSIS — I10 Essential (primary) hypertension: Secondary | ICD-10-CM | POA: Diagnosis not present

## 2021-01-31 DIAGNOSIS — E162 Hypoglycemia, unspecified: Secondary | ICD-10-CM | POA: Diagnosis not present

## 2021-01-31 DIAGNOSIS — R404 Transient alteration of awareness: Secondary | ICD-10-CM | POA: Diagnosis not present

## 2021-01-31 DIAGNOSIS — E161 Other hypoglycemia: Secondary | ICD-10-CM | POA: Diagnosis not present

## 2021-01-31 DIAGNOSIS — R4020 Unspecified coma: Secondary | ICD-10-CM | POA: Diagnosis not present

## 2021-02-01 ENCOUNTER — Encounter (HOSPITAL_COMMUNITY): Payer: Self-pay | Admitting: Emergency Medicine

## 2021-02-01 ENCOUNTER — Other Ambulatory Visit: Payer: Self-pay

## 2021-02-01 ENCOUNTER — Emergency Department (HOSPITAL_COMMUNITY)
Admission: EM | Admit: 2021-02-01 | Discharge: 2021-02-01 | Disposition: A | Discharge status: Home / Self Care | Payer: Medicare HMO | Attending: Emergency Medicine | Admitting: Emergency Medicine

## 2021-02-01 DIAGNOSIS — Z951 Presence of aortocoronary bypass graft: Secondary | ICD-10-CM | POA: Diagnosis not present

## 2021-02-01 DIAGNOSIS — Z20822 Contact with and (suspected) exposure to covid-19: Secondary | ICD-10-CM | POA: Diagnosis not present

## 2021-02-01 DIAGNOSIS — I2510 Atherosclerotic heart disease of native coronary artery without angina pectoris: Secondary | ICD-10-CM | POA: Diagnosis not present

## 2021-02-01 DIAGNOSIS — E162 Hypoglycemia, unspecified: Secondary | ICD-10-CM

## 2021-02-01 DIAGNOSIS — E11649 Type 2 diabetes mellitus with hypoglycemia without coma: Secondary | ICD-10-CM | POA: Diagnosis not present

## 2021-02-01 DIAGNOSIS — Z743 Need for continuous supervision: Secondary | ICD-10-CM | POA: Diagnosis not present

## 2021-02-01 DIAGNOSIS — E1140 Type 2 diabetes mellitus with diabetic neuropathy, unspecified: Secondary | ICD-10-CM | POA: Diagnosis not present

## 2021-02-01 DIAGNOSIS — R0902 Hypoxemia: Secondary | ICD-10-CM | POA: Diagnosis not present

## 2021-02-01 DIAGNOSIS — R69 Illness, unspecified: Secondary | ICD-10-CM | POA: Diagnosis not present

## 2021-02-01 DIAGNOSIS — F17210 Nicotine dependence, cigarettes, uncomplicated: Secondary | ICD-10-CM | POA: Diagnosis not present

## 2021-02-01 DIAGNOSIS — I10 Essential (primary) hypertension: Secondary | ICD-10-CM | POA: Insufficient documentation

## 2021-02-01 DIAGNOSIS — E161 Other hypoglycemia: Secondary | ICD-10-CM | POA: Diagnosis not present

## 2021-02-01 LAB — RESP PANEL BY RT-PCR (FLU A&B, COVID) ARPGX2
Influenza A by PCR: NEGATIVE
Influenza B by PCR: NEGATIVE
SARS Coronavirus 2 by RT PCR: NEGATIVE

## 2021-02-01 LAB — COMPREHENSIVE METABOLIC PANEL
ALT: 24 U/L (ref 0–44)
AST: 34 U/L (ref 15–41)
Albumin: 3.9 g/dL (ref 3.5–5.0)
Alkaline Phosphatase: 64 U/L (ref 38–126)
Anion gap: 8 (ref 5–15)
BUN: 32 mg/dL — ABNORMAL HIGH (ref 8–23)
CO2: 23 mmol/L (ref 22–32)
Calcium: 9.1 mg/dL (ref 8.9–10.3)
Chloride: 106 mmol/L (ref 98–111)
Creatinine, Ser: 1.94 mg/dL — ABNORMAL HIGH (ref 0.61–1.24)
GFR, Estimated: 34 mL/min — ABNORMAL LOW (ref >60)
Glucose, Bld: 133 mg/dL — ABNORMAL HIGH (ref 70–99)
Potassium: 5.4 mmol/L — ABNORMAL HIGH (ref 3.5–5.1)
Sodium: 137 mmol/L (ref 135–145)
Total Bilirubin: 1.6 mg/dL — ABNORMAL HIGH (ref 0.3–1.2)
Total Protein: 7.1 g/dL (ref 6.5–8.1)

## 2021-02-01 LAB — URINALYSIS, ROUTINE W REFLEX MICROSCOPIC
Bilirubin Urine: NEGATIVE
Glucose, UA: NEGATIVE mg/dL
Hgb urine dipstick: NEGATIVE
Ketones, ur: NEGATIVE mg/dL
Leukocytes,Ua: NEGATIVE
Nitrite: NEGATIVE
Protein, ur: NEGATIVE mg/dL
Specific Gravity, Urine: 1.013 (ref 1.005–1.030)
pH: 5 (ref 5.0–8.0)

## 2021-02-01 LAB — CBC
HCT: 31.8 % — ABNORMAL LOW (ref 39.0–52.0)
Hemoglobin: 10 g/dL — ABNORMAL LOW (ref 13.0–17.0)
MCH: 29.4 pg (ref 26.0–34.0)
MCHC: 31.4 g/dL (ref 30.0–36.0)
MCV: 93.5 fL (ref 80.0–100.0)
Platelets: 160 10*3/uL (ref 150–400)
RBC: 3.4 MIL/uL — ABNORMAL LOW (ref 4.22–5.81)
RDW: 14.3 % (ref 11.5–15.5)
WBC: 5.1 10*3/uL (ref 4.0–10.5)
nRBC: 0 % (ref 0.0–0.2)

## 2021-02-01 LAB — CBG MONITORING, ED
Glucose-Capillary: 140 mg/dL — ABNORMAL HIGH (ref 70–99)
Glucose-Capillary: 144 mg/dL — ABNORMAL HIGH (ref 70–99)
Glucose-Capillary: 119 mg/dL — ABNORMAL HIGH (ref 70–99)
Glucose-Capillary: 141 mg/dL — ABNORMAL HIGH (ref 70–99)

## 2021-02-01 NOTE — ED Triage Notes (Signed)
Per EMS, patient from home, hypoglycemic 38 on fire arrival. Oral glucose given CBG 58. Last CBG 152. Family concerned as patient has had multiple hypoglycemia episodes recently. Sliding scale per family. Recent 30 pound weight loss over the last couple of months per patient. A&Ox4.  20R AC D10 with EMS

## 2021-02-01 NOTE — ED Provider Notes (Signed)
Emergency Department Provider Note   I have reviewed the triage vital signs and the nursing notes.   HISTORY  Chief Complaint Hypoglycemia   HPI Cristian Hayden is a 80 y.o. male with past medical history reviewed below including insulin-dependent diabetes presents to the emergency department with episodic hypoglycemia.  Patient states that he has been feeling well but intermittently will feel very weak and lightheaded.  He checks his blood sugar and has dropped significantly.  He tells me that his daughter manages his medications and assists him with checking his blood sugar and administering the insulin.  He has found that he is lost around 20 pounds in the last several months.  He tells me his appetite is not quite as good but is not having pain in the abdomen or chest.  No fevers or chills.  He has developed a mild runny nose.  No changes to any other medications.  He notes that during a recent encounter with EMS he was encouraged to begin taking his insulin 3 times per day whereas he had typically been administering it only as needed.  He also takes metformin.  He is not completely clear on the circumstances surrounding the EMS recommendation but believes that is when the more frequent dosing started.   EMS arrives to find the patient with CBG of 38. They gave PO glucose and started a D10 infusion en route.    Past Medical History:  Diagnosis Date   Arthritis    Bronchitis    CAD (coronary artery disease)    Diabetes mellitus without complication (HCC)    Hyperlipidemia    Hypertension    Peripheral vascular disease (HCC)    with claludication   PVD (peripheral vascular disease) (HCC)     Patient Active Problem List   Diagnosis Date Noted   Mixed hyperlipidemia 09/18/2020   Simple chronic bronchitis (HCC) 09/18/2020   Type 2 diabetes mellitus without complication (HCC) 09/18/2020   Pain due to onychomycosis of toenails of both feet 06/18/2020   Diabetic neuropathy (HCC)  06/18/2020   Dyslipidemia 06/21/2019   Coronary artery disease 10/27/2018   Educated about COVID-19 virus infection 10/27/2018   Diabetes mellitus (HCC) 10/27/2018   Pure hypercholesterolemia 10/27/2018   Hypertension 10/27/2018   Stomach pain 07/06/2014   Lumbago 07/06/2014   Central retinal artery occlusion of right eye    CAD (coronary artery disease), native coronary artery 12/15/2012   Abnormal EKG 05/25/2012   Numbness in left leg 03/08/2012   Peripheral vascular disease, unspecified (HCC) 03/08/2012   Cold 03/08/2012   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  Family History  Problem Relation Age of Onset   Hypertension Mother    Heart disease Father 67       CAD   Hypertension Father    Hypertension Sister    Hypertension Brother     Social History Social History   Tobacco Use   Smoking status: Some Days    Packs/day: 0.20    Types: Cigarettes   Smokeless tobacco: Never   Tobacco comments:    pt states  he is trying, only smokes about 2-3 per day  Vaping Use   Vaping Use: Never used  Substance Use Topics   Alcohol use: No    Comment: former   Drug use: No    Review of Systems  Constitutional: No fever/chills. Intermittent weakness.  Eyes: No visual changes. ENT: No sore throat. Positive mild runny nose.  Cardiovascular: Denies chest pain. Respiratory: Denies shortness of breath. Gastrointestinal: No abdominal pain.  No nausea, no vomiting.  No diarrhea.  No constipation. Genitourinary: Negative for dysuria. Musculoskeletal: Negative for back pain. Skin: Negative for rash. Neurological: Negative for headaches,  focal weakness or numbness.  10-point ROS otherwise negative.  ____________________________________________   PHYSICAL EXAM:  VITAL SIGNS: ED Triage Vitals  Enc Vitals Group     BP 02/01/21 1758 (!) 168/51     Pulse Rate 02/01/21 1758 (!) 56     Resp 02/01/21 1758 16     Temp 02/01/21 1758 97.7 F (36.5 C)     Temp Source 02/01/21 1758 Oral     SpO2 02/01/21 1758 97 %   Constitutional: Alert and oriented. Well appearing and in no acute distress. Eyes: Conjunctivae are normal.  Head: Atraumatic. Nose: No congestion/rhinnorhea. Mouth/Throat: Mucous membranes are moist.  Neck: No stridor.   Cardiovascular: Normal rate, regular rhythm. Good peripheral circulation. Grossly normal heart sounds.   Respiratory: Normal respiratory effort.  No retractions. Lungs CTAB. Gastrointestinal: Soft and nontender. No distention.  Musculoskeletal: No lower extremity tenderness nor edema. No gross deformities of extremities. Neurologic:  Normal speech and language. No gross focal neurologic deficits are appreciated.  Skin:  Skin is warm, dry and intact. No rash noted.  ____________________________________________   LABS (all labs ordered are listed, but only abnormal results are displayed)  Labs Reviewed  CBC - Abnormal; Notable for the following components:      Result Value   RBC 3.40 (*)    Hemoglobin 10.0 (*)    HCT 31.8 (*)    All other components within normal limits  COMPREHENSIVE METABOLIC PANEL - Abnormal; Notable for the following components:   Potassium 5.4 (*)    Glucose, Bld 133 (*)    BUN 32 (*)    Creatinine, Ser 1.94 (*)    Total Bilirubin 1.6 (*)    GFR, Estimated 34 (*)    All other components within normal limits  CBG MONITORING, ED - Abnormal; Notable for the following components:   Glucose-Capillary 140 (*)    All other components within normal limits  CBG MONITORING, ED - Abnormal; Notable for the following components:   Glucose-Capillary 144 (*)    All  other components within normal limits  CBG MONITORING, ED - Abnormal; Notable for the following components:   Glucose-Capillary 119 (*)    All other components within normal limits  CBG MONITORING, ED - Abnormal; Notable for the following components:   Glucose-Capillary 141 (*)    All other components within normal limits  RESP PANEL BY RT-PCR (FLU A&B, COVID) ARPGX2  URINALYSIS, ROUTINE W REFLEX MICROSCOPIC  CBG MONITORING, ED   ____________________________________________  RADIOLOGY  None  ____________________________________________   PROCEDURES  Procedure(s) performed:   Procedures  None  ____________________________________________   INITIAL IMPRESSION / ASSESSMENT AND PLAN / ED COURSE  Pertinent labs & imaging results that were available during my care of the patient were reviewed by me and considered in my medical decision making (see chart for details).   Patient presents to the emergency department with intermittent  hypoglycemia.  Appears to be taking the NovoLog FlexPen along with metformin.  We will try and confirm the patient's insulin type and dosing with the daughter, when she arrives.  Some question if the patient may be taking his insulin more frequently than before.  He is also had some unintentional weight loss in the past 2 months which which may affect his insulin dosing. CBG here is WNL after treatment in the field. No clear infection signs other than mild rhinorrhea. Plan for serial CBG, labs, COVD/Flu swab, and reassess.   10:42 PM Patient's lab work reviewed.  No signs or symptoms to suspect infection.  No evidence of kidney injury or other electrolyte disturbance.  Patient has been observed in the emergency department for approximately 5 hours at this point with no return of hypoglycemia.  He is not on Raven Harmes-acting insulin.  I had a discussion with the patient's daughter, Randa Evens, by phone.  We reviewed the patient's sliding scale by phone along with his  medication list.  She will continue to check his blood sugars and give half the dose prescribed by the sliding scale until he can be seen by his PCP. Will need to be seen ASAP. Discussed return precautions.  ____________________________________________  FINAL CLINICAL IMPRESSION(S) / ED DIAGNOSES  Final diagnoses:  Hypoglycemia     Note:  This document was prepared using Dragon voice recognition software and may include unintentional dictation errors.  Alona Bene, MD, [facility]    Kamir Selover, Arlyss Repress, MD 02/01/21 [phone]

## 2021-02-01 NOTE — Discharge Instructions (Signed)
You were seen in the emergency room today with low blood sugars.  After speaking with your daughter and you we are going to cut your sliding scale insulin dose in half.  Please continue to check your blood sugar and refer to the sliding scale but give half the dose required and recheck blood sugar 1 hour later.  Please continue this until you can be seen by your primary care doctor.  I like for you to be seen ideally within the next week.  If you have additional episodes of very low or very high blood pressure please call EMS and/or return to the emergency department for reevaluation.

## 2021-03-18 DIAGNOSIS — J410 Simple chronic bronchitis: Secondary | ICD-10-CM | POA: Diagnosis not present

## 2021-03-18 DIAGNOSIS — Z720 Tobacco use: Secondary | ICD-10-CM | POA: Diagnosis not present

## 2021-03-18 DIAGNOSIS — I6521 Occlusion and stenosis of right carotid artery: Secondary | ICD-10-CM | POA: Diagnosis not present

## 2021-03-18 DIAGNOSIS — I2510 Atherosclerotic heart disease of native coronary artery without angina pectoris: Secondary | ICD-10-CM | POA: Diagnosis not present

## 2021-03-18 DIAGNOSIS — E782 Mixed hyperlipidemia: Secondary | ICD-10-CM | POA: Diagnosis not present

## 2021-03-18 DIAGNOSIS — I10 Essential (primary) hypertension: Secondary | ICD-10-CM | POA: Diagnosis not present

## 2021-03-18 DIAGNOSIS — R630 Anorexia: Secondary | ICD-10-CM | POA: Diagnosis not present

## 2021-03-18 DIAGNOSIS — E119 Type 2 diabetes mellitus without complications: Secondary | ICD-10-CM | POA: Diagnosis not present

## 2021-03-26 ENCOUNTER — Other Ambulatory Visit: Payer: Self-pay

## 2021-03-26 ENCOUNTER — Encounter: Payer: Self-pay | Admitting: Podiatry

## 2021-03-26 ENCOUNTER — Ambulatory Visit: Payer: Medicare HMO | Admitting: Podiatry

## 2021-03-26 DIAGNOSIS — E1142 Type 2 diabetes mellitus with diabetic polyneuropathy: Secondary | ICD-10-CM | POA: Diagnosis not present

## 2021-03-26 DIAGNOSIS — I739 Peripheral vascular disease, unspecified: Secondary | ICD-10-CM

## 2021-03-26 DIAGNOSIS — B351 Tinea unguium: Secondary | ICD-10-CM

## 2021-03-26 DIAGNOSIS — M79675 Pain in left toe(s): Secondary | ICD-10-CM

## 2021-03-26 DIAGNOSIS — M79674 Pain in right toe(s): Secondary | ICD-10-CM

## 2021-03-26 NOTE — Progress Notes (Signed)
This patient returns to my office for at risk foot care.  This patient requires this care by a professional since this patient will be at risk due to having diabetes mellitus and pvd.  He presents to the officew with male caregiver.  This patient is unable to cut nails himself since the patient cannot reach his nails.These nails are painful walking and wearing shoes.  This patient presents for at risk foot care today.  General Appearance  Alert, conversant and in no acute stress.  Vascular  Dorsalis pedis and posterior tibial  pulses are weakly  palpable  bilaterally.  Capillary return is within normal limits  bilaterally. Cold feet  Bilaterally. Absent digital hair  B/L.  Neurologic  Senn-Weinstein monofilament wire test within normal limits  bilaterally. Muscle power within normal limits bilaterally.  Nails Thick disfigured discolored nails with subungual debris  from hallux to fifth toes bilaterally. No evidence of bacterial infection or drainage bilaterally.  Orthopedic  No limitations of motion  feet .  No crepitus or effusions noted.  No bony pathology or digital deformities noted.  Skin  normotropic skin with no porokeratosis noted bilaterally.  No signs of infections or ulcers noted.     Onychomycosis  Pain in right toes  Pain in left toes  Consent was obtained for treatment procedures.   Mechanical debridement of nails 1-5  bilaterally performed with a nail nipper.  Filed with dremel without incident.    Return office visit     3 months                 Told patient to return for periodic foot care and evaluation due to potential at risk complications.   Kesa Birky DPM  

## 2021-03-27 DIAGNOSIS — R1310 Dysphagia, unspecified: Secondary | ICD-10-CM | POA: Diagnosis not present

## 2021-03-27 DIAGNOSIS — I129 Hypertensive chronic kidney disease with stage 1 through stage 4 chronic kidney disease, or unspecified chronic kidney disease: Secondary | ICD-10-CM | POA: Diagnosis not present

## 2021-03-27 DIAGNOSIS — R399 Unspecified symptoms and signs involving the genitourinary system: Secondary | ICD-10-CM | POA: Diagnosis not present

## 2021-03-27 DIAGNOSIS — R634 Abnormal weight loss: Secondary | ICD-10-CM | POA: Diagnosis not present

## 2021-03-27 DIAGNOSIS — N1832 Chronic kidney disease, stage 3b: Secondary | ICD-10-CM | POA: Diagnosis not present

## 2021-03-28 ENCOUNTER — Telehealth: Payer: Self-pay

## 2021-03-28 ENCOUNTER — Other Ambulatory Visit: Payer: Self-pay

## 2021-03-28 DIAGNOSIS — I739 Peripheral vascular disease, unspecified: Secondary | ICD-10-CM

## 2021-03-28 DIAGNOSIS — I6521 Occlusion and stenosis of right carotid artery: Secondary | ICD-10-CM

## 2021-03-28 NOTE — Telephone Encounter (Signed)
Daughter calls today to schedule appt for her father, says PCP wants them to make appt for carotid stenosis. Denies that he is having any problems with stroke like symptoms. Also denies any problems with his legs. Moved surveillance appt for carotid and ABIs up from February to December.

## 2021-04-01 ENCOUNTER — Other Ambulatory Visit: Payer: Self-pay | Admitting: Cardiology

## 2021-04-08 DIAGNOSIS — H35033 Hypertensive retinopathy, bilateral: Secondary | ICD-10-CM | POA: Diagnosis not present

## 2021-04-08 DIAGNOSIS — H04123 Dry eye syndrome of bilateral lacrimal glands: Secondary | ICD-10-CM | POA: Diagnosis not present

## 2021-04-08 DIAGNOSIS — H43822 Vitreomacular adhesion, left eye: Secondary | ICD-10-CM | POA: Diagnosis not present

## 2021-04-08 DIAGNOSIS — H47011 Ischemic optic neuropathy, right eye: Secondary | ICD-10-CM | POA: Diagnosis not present

## 2021-04-08 DIAGNOSIS — H16223 Keratoconjunctivitis sicca, not specified as Sjogren's, bilateral: Secondary | ICD-10-CM | POA: Diagnosis not present

## 2021-04-08 DIAGNOSIS — Z961 Presence of intraocular lens: Secondary | ICD-10-CM | POA: Diagnosis not present

## 2021-04-08 DIAGNOSIS — H401132 Primary open-angle glaucoma, bilateral, moderate stage: Secondary | ICD-10-CM | POA: Diagnosis not present

## 2021-04-17 ENCOUNTER — Ambulatory Visit (HOSPITAL_COMMUNITY): Payer: Medicare HMO

## 2021-04-17 ENCOUNTER — Ambulatory Visit: Payer: Medicare HMO | Admitting: Vascular Surgery

## 2021-04-24 ENCOUNTER — Ambulatory Visit (INDEPENDENT_AMBULATORY_CARE_PROVIDER_SITE_OTHER)
Admission: RE | Admit: 2021-04-24 | Discharge: 2021-04-24 | Disposition: A | Discharge status: Home / Self Care | Payer: Medicare HMO | Source: Ambulatory Visit | Attending: Vascular Surgery | Admitting: Vascular Surgery

## 2021-04-24 ENCOUNTER — Other Ambulatory Visit: Payer: Self-pay

## 2021-04-24 ENCOUNTER — Ambulatory Visit (HOSPITAL_COMMUNITY)
Admission: RE | Admit: 2021-04-24 | Discharge: 2021-04-24 | Disposition: A | Discharge status: Home / Self Care | Payer: Medicare HMO | Source: Ambulatory Visit | Attending: Vascular Surgery | Admitting: Vascular Surgery

## 2021-04-24 DIAGNOSIS — I6521 Occlusion and stenosis of right carotid artery: Secondary | ICD-10-CM | POA: Insufficient documentation

## 2021-04-24 DIAGNOSIS — I739 Peripheral vascular disease, unspecified: Secondary | ICD-10-CM | POA: Insufficient documentation

## 2021-05-01 ENCOUNTER — Encounter: Payer: Self-pay | Admitting: Vascular Surgery

## 2021-05-01 ENCOUNTER — Ambulatory Visit (INDEPENDENT_AMBULATORY_CARE_PROVIDER_SITE_OTHER): Payer: Medicare HMO | Admitting: Vascular Surgery

## 2021-05-01 ENCOUNTER — Other Ambulatory Visit: Payer: Self-pay

## 2021-05-01 VITALS — BP 81/51 | HR 83 | Temp 97.8°F | Resp 18 | Ht 64.0 in | Wt 120.0 lb

## 2021-05-01 DIAGNOSIS — I6521 Occlusion and stenosis of right carotid artery: Secondary | ICD-10-CM | POA: Diagnosis not present

## 2021-05-01 DIAGNOSIS — I739 Peripheral vascular disease, unspecified: Secondary | ICD-10-CM | POA: Diagnosis not present

## 2021-05-01 NOTE — Progress Notes (Signed)
REASON FOR VISIT:   Follow-up of peripheral vascular disease and carotid.  MEDICAL ISSUES:   PERIPHERAL VASCULAR DISEASE: This patient has stable multilevel arterial occlusive disease.  He has a chronically occluded femoral-femoral graft that was done in 2010.  I encouraged him to stay as active as possible.  He does not have critical limb ischemia.  He denies rest pain or nonhealing ulcers.  He is fairly debilitated and I would not recommend an aggressive approach unless he developed critical limb ischemia.  We also discussed the importance of tobacco cessation.  I ordered follow-up ABIs in 1 year and I will see him back at that time.  CAROTID DISEASE: His right carotid stenosis which was in the 40 to 59% range previously is now less than 39%.  He has a less than 39% stenosis bilaterally and both vertebral arteries are patent.  He is asymptomatic.  I have ordered a follow-up carotid duplex scan in 1 year and I will see him back at that time.  He is on aspirin and is on a statin  HPI:   Cristian Hayden is a pleasant 81 y.o. male who I last saw on 06/05/2020 for follow-up of his peripheral vascular disease and right carotid stenosis.  At that time he had an asymptomatic 40 to 59% right carotid stenosis.  He was on aspirin and was on a statin.  We discussed the importance of tobacco cessation.  I set him up for a 1 year follow-up visit.  With respect to his carotid disease, he denies any history of stroke, TIAs, expressive or receptive aphasia, or amaurosis fugax.  I was also following him with peripheral vascular disease.  He had undergone a previous right to left femoral-femoral bypass in 2010 by Dr. Madilyn FiremanHayes.  This has been chronically occluded.  He had evidence of multilevel arterial occlusive disease bilaterally.  I encouraged him to stay as active as possible.  I felt he would only consider CTA angiography or arteriography if he develops significant rest pain or nonhealing ulcer.  He was set up for  a 1 year follow-up visit.  He does describe bilateral calf claudication which is more significant on the left side.  This occurs in a fairly short distance.  I think his activity is fairly limited.  He denies any history of rest pain with exception of some occasional pain in the left heel at night.  He denies any history of nonhealing wounds.  He smokes about 6 cigarettes a day.   Past Medical History:  Diagnosis Date   Arthritis    Bronchitis    CAD (coronary artery disease)    Diabetes mellitus without complication (HCC)    Hyperlipidemia    Hypertension    Peripheral vascular disease (HCC)    with claludication   PVD (peripheral vascular disease) (HCC)     Family History  Problem Relation Age of Onset   Hypertension Mother    Heart disease Father [id number]       CAD   Hypertension Father    Hypertension Sister    Hypertension Brother     SOCIAL HISTORY: Social History   Tobacco Use   Smoking status: Some Days    Packs/day: 0.20    Types: Cigarettes   Smokeless tobacco: Never   Tobacco comments:    pt states he is trying, only smokes about 2-3 per day  Substance Use Topics   Alcohol use: No    Comment: former    No  Known Allergies  Current Outpatient Medications  Medication Sig Dispense Refill   ALPHAGAN P 0.1 % SOLN INT 1 GTT LEY BID     atorvastatin (LIPITOR) 40 MG tablet Take 40 mg by mouth daily.      BD PEN NEEDLE NANO 2ND GEN 32G X 4 MM MISC See admin instructions.     brimonidine (ALPHAGAN) 0.2 % ophthalmic solution 1 drop 2 (two) times daily.     carvedilol (COREG) 6.25 MG tablet TAKE 1 TABLET(6.25 MG) BY MOUTH TWICE DAILY WITH A MEAL 180 tablet 0   chlorthalidone (HYGROTON) 25 MG tablet TAKE 1 TABLET(25 MG) BY MOUTH DAILY 30 tablet 1   dorzolamide-timolol (COSOPT) 22.3-6.8 MG/ML ophthalmic solution Place 1 drop into both eyes 2 (two) times daily.  4   hydrocortisone (ANUSOL-HC) 2.5 % rectal cream Place rectally 2 (two) times daily.     Lancets (ONETOUCH  DELICA PLUS LANCET30G) MISC 4 (four) times daily. for testing     latanoprost (XALATAN) 0.005 % ophthalmic solution Place 1 drop into both eyes at bedtime.  4   metFORMIN (GLUCOPHAGE) 500 MG tablet TK 1 T PO  BID WITH MORNING AND EVENING MEALS     NOVOLOG FLEXPEN 100 UNIT/ML FlexPen SMARTSIG:2-15 Unit(s) IM 3 Times Daily     ONETOUCH ULTRA test strip 1 each by Other route 3 (three) times daily. for testing     Polyethyl Glycol-Propyl Glycol (SYSTANE OP) Apply to eye.     tamsulosin (FLOMAX) 0.4 MG CAPS capsule Take 0.8 mg by mouth daily. Reported on 07/10/2015     No current facility-administered medications for this visit.    REVIEW OF SYSTEMS:  [X]  denotes positive finding, [ ]  denotes negative finding Cardiac  Comments:  Chest pain or chest pressure:    Shortness of breath upon exertion:    Short of breath when lying flat:    Irregular heart rhythm:        Vascular    Pain in calf, thigh, or hip brought on by ambulation: x   Pain in feet at night that wakes you up from your sleep:     Blood clot in your veins:    Leg swelling:         Pulmonary    Oxygen at home:    Productive cough:     Wheezing:         Neurologic    Sudden weakness in arms or legs:     Sudden numbness in arms or legs:     Sudden onset of difficulty speaking or slurred speech:    Temporary loss of vision in one eye:     Problems with dizziness:         Gastrointestinal    Blood in stool:     Vomited blood:         Genitourinary    Burning when urinating:     Blood in urine:        Psychiatric    Major depression:         Hematologic    Bleeding problems:    Problems with blood clotting too easily:        Skin    Rashes or ulcers:        Constitutional    Fever or chills:     PHYSICAL EXAM:   Vitals:   05/01/21 1042 05/01/21 1048  BP: (!) 72/48 (!) 81/51  Pulse: 83   Resp: 18   Temp: 97.8 F (36.6 C)  TempSrc: Temporal   SpO2: 100%   Weight: 120 lb (54.4 kg)   Height: 5\' 4"   (1.626 m)     GENERAL: The patient is a well-nourished male, in no acute distress. The vital signs are documented above. CARDIAC: There is a regular rate and rhythm.  VASCULAR: I do not detect carotid bruits. I cannot palpate femoral pulses. I cannot palpate pedal pulses. He has no significant lower extremity swelling. PULMONARY: There is good air exchange bilaterally without wheezing or rales. ABDOMEN: Soft and non-tender with normal pitched bowel sounds.  MUSCULOSKELETAL: There are no major deformities or cyanosis. NEUROLOGIC: No focal weakness or paresthesias are detected. SKIN: There are no ulcers or rashes noted. PSYCHIATRIC: The patient has a normal affect.  DATA:    ARTERIAL DOPPLER STUDY: I have reviewed his arterial Doppler study that was done on 04/24/2021.  On the right side there is a monophasic dorsalis pedis and posterior tibial signal.  ABI was 78%.  Toe pressure was 47 mmHg.  On the left side there is a monophasic dorsalis pedis and posterior tibial signal.  ABI was 56%.  Toe pressure was 39 mmHg.  CAROTID DUPLEX: I have reviewed his carotid duplex scan that was done on 04/24/2021.  On the right side there was a less than 39% stenosis.  The right vertebral artery was patent with antegrade flow.  On the left side there was a less than 39% stenosis.  The left vertebral artery was patent with antegrade flow.  Both subclavian arteries have normal flow.  04/26/2021 [facility] [phone]

## 2021-05-02 DIAGNOSIS — Z860100 Personal history of colon polyps, unspecified: Secondary | ICD-10-CM | POA: Insufficient documentation

## 2021-05-02 DIAGNOSIS — K5900 Constipation, unspecified: Secondary | ICD-10-CM | POA: Insufficient documentation

## 2021-05-02 DIAGNOSIS — R630 Anorexia: Secondary | ICD-10-CM | POA: Insufficient documentation

## 2021-05-07 ENCOUNTER — Ambulatory Visit: Payer: Medicare HMO | Admitting: Gastroenterology

## 2021-06-02 ENCOUNTER — Other Ambulatory Visit: Payer: Self-pay | Admitting: Cardiology

## 2021-06-25 ENCOUNTER — Ambulatory Visit: Payer: Medicare HMO | Admitting: Podiatry

## 2021-07-01 ENCOUNTER — Emergency Department (HOSPITAL_COMMUNITY)
Admission: EM | Admit: 2021-07-01 | Discharge: 2021-07-01 | Discharge status: Against Medical Advice | Payer: Medicare HMO

## 2021-07-01 NOTE — ED Notes (Signed)
LWBS 

## 2021-07-01 NOTE — ED Notes (Signed)
Pt family member stated, "i'm not gonna wait fours, i'm just going to take him to [place]." ?

## 2021-07-02 DIAGNOSIS — I10 Essential (primary) hypertension: Secondary | ICD-10-CM | POA: Diagnosis not present

## 2021-07-02 DIAGNOSIS — E1165 Type 2 diabetes mellitus with hyperglycemia: Secondary | ICD-10-CM | POA: Diagnosis not present

## 2021-07-02 DIAGNOSIS — J410 Simple chronic bronchitis: Secondary | ICD-10-CM | POA: Diagnosis not present

## 2021-07-02 DIAGNOSIS — Z794 Long term (current) use of insulin: Secondary | ICD-10-CM | POA: Diagnosis not present

## 2021-07-02 DIAGNOSIS — Z136 Encounter for screening for cardiovascular disorders: Secondary | ICD-10-CM | POA: Diagnosis not present

## 2021-07-02 DIAGNOSIS — Z0001 Encounter for general adult medical examination with abnormal findings: Secondary | ICD-10-CM | POA: Diagnosis not present

## 2021-07-02 DIAGNOSIS — Z1329 Encounter for screening for other suspected endocrine disorder: Secondary | ICD-10-CM | POA: Diagnosis not present

## 2021-07-02 DIAGNOSIS — E782 Mixed hyperlipidemia: Secondary | ICD-10-CM | POA: Diagnosis not present

## 2021-07-02 DIAGNOSIS — Z125 Encounter for screening for malignant neoplasm of prostate: Secondary | ICD-10-CM | POA: Diagnosis not present

## 2021-07-16 DIAGNOSIS — J410 Simple chronic bronchitis: Secondary | ICD-10-CM | POA: Diagnosis not present

## 2021-07-16 DIAGNOSIS — I10 Essential (primary) hypertension: Secondary | ICD-10-CM | POA: Diagnosis not present

## 2021-07-16 DIAGNOSIS — E1165 Type 2 diabetes mellitus with hyperglycemia: Secondary | ICD-10-CM | POA: Diagnosis not present

## 2021-07-16 DIAGNOSIS — Z794 Long term (current) use of insulin: Secondary | ICD-10-CM | POA: Diagnosis not present

## 2021-07-16 DIAGNOSIS — E782 Mixed hyperlipidemia: Secondary | ICD-10-CM | POA: Diagnosis not present

## 2021-07-31 ENCOUNTER — Telehealth: Payer: Self-pay | Admitting: *Deleted

## 2021-08-26 NOTE — Telephone Encounter (Signed)
error 

## 2021-09-17 ENCOUNTER — Ambulatory Visit: Payer: No Typology Code available for payment source | Admitting: Podiatry

## 2021-09-19 ENCOUNTER — Ambulatory Visit: Payer: No Typology Code available for payment source | Admitting: Podiatry

## 2021-09-19 DIAGNOSIS — Z961 Presence of intraocular lens: Secondary | ICD-10-CM | POA: Diagnosis not present

## 2021-09-19 DIAGNOSIS — H04123 Dry eye syndrome of bilateral lacrimal glands: Secondary | ICD-10-CM | POA: Diagnosis not present

## 2021-09-19 DIAGNOSIS — H16223 Keratoconjunctivitis sicca, not specified as Sjogren's, bilateral: Secondary | ICD-10-CM | POA: Diagnosis not present

## 2021-09-19 DIAGNOSIS — H401132 Primary open-angle glaucoma, bilateral, moderate stage: Secondary | ICD-10-CM | POA: Diagnosis not present

## 2021-09-19 DIAGNOSIS — H47011 Ischemic optic neuropathy, right eye: Secondary | ICD-10-CM | POA: Diagnosis not present

## 2021-09-26 ENCOUNTER — Ambulatory Visit: Payer: No Typology Code available for payment source | Admitting: Podiatry

## 2021-10-29 DIAGNOSIS — I10 Essential (primary) hypertension: Secondary | ICD-10-CM | POA: Diagnosis not present

## 2021-10-29 DIAGNOSIS — E1165 Type 2 diabetes mellitus with hyperglycemia: Secondary | ICD-10-CM | POA: Diagnosis not present

## 2021-10-29 DIAGNOSIS — Z794 Long term (current) use of insulin: Secondary | ICD-10-CM | POA: Diagnosis not present

## 2021-10-29 DIAGNOSIS — J410 Simple chronic bronchitis: Secondary | ICD-10-CM | POA: Diagnosis not present

## 2021-10-29 DIAGNOSIS — K5900 Constipation, unspecified: Secondary | ICD-10-CM | POA: Diagnosis not present

## 2021-10-29 DIAGNOSIS — E782 Mixed hyperlipidemia: Secondary | ICD-10-CM | POA: Diagnosis not present

## 2021-11-26 ENCOUNTER — Ambulatory Visit (INDEPENDENT_AMBULATORY_CARE_PROVIDER_SITE_OTHER): Payer: No Typology Code available for payment source | Admitting: Podiatry

## 2021-11-26 ENCOUNTER — Encounter: Payer: Self-pay | Admitting: Podiatry

## 2021-11-26 DIAGNOSIS — M79674 Pain in right toe(s): Secondary | ICD-10-CM

## 2021-11-26 DIAGNOSIS — E1142 Type 2 diabetes mellitus with diabetic polyneuropathy: Secondary | ICD-10-CM | POA: Diagnosis not present

## 2021-11-26 DIAGNOSIS — M79675 Pain in left toe(s): Secondary | ICD-10-CM | POA: Diagnosis not present

## 2021-11-26 DIAGNOSIS — B351 Tinea unguium: Secondary | ICD-10-CM | POA: Diagnosis not present

## 2021-11-26 DIAGNOSIS — I739 Peripheral vascular disease, unspecified: Secondary | ICD-10-CM | POA: Diagnosis not present

## 2021-11-26 NOTE — Progress Notes (Signed)
This patient returns to my office for at risk foot care.  This patient requires this care by a professional since this patient will be at risk due to having diabetes mellitus and pvd.  He presents to the officew with male caregiver.  This patient is unable to cut nails himself since the patient cannot reach his nails.These nails are painful walking and wearing shoes.  This patient presents for at risk foot care today.  General Appearance  Alert, conversant and in no acute stress.  Vascular  Dorsalis pedis and posterior tibial  pulses are weakly  palpable  bilaterally.  Capillary return is within normal limits  bilaterally. Cold feet  Bilaterally. Absent digital hair  B/L.  Neurologic  Senn-Weinstein monofilament wire test within normal limits  bilaterally. Muscle power within normal limits bilaterally.  Nails Thick disfigured discolored nails with subungual debris  from hallux to fifth toes bilaterally. No evidence of bacterial infection or drainage bilaterally.  Orthopedic  No limitations of motion  feet .  No crepitus or effusions noted.  No bony pathology or digital deformities noted.  Skin  normotropic skin with no porokeratosis noted bilaterally.  No signs of infections or ulcers noted.     Onychomycosis  Pain in right toes  Pain in left toes  Consent was obtained for treatment procedures.   Mechanical debridement of nails 1-5  bilaterally performed with a nail nipper.  Filed with dremel without incident.    Return office visit     3 months                 Told patient to return for periodic foot care and evaluation due to potential at risk complications.   Allicia Culley DPM  

## 2021-11-27 DIAGNOSIS — R634 Abnormal weight loss: Secondary | ICD-10-CM | POA: Diagnosis not present

## 2021-11-27 DIAGNOSIS — I129 Hypertensive chronic kidney disease with stage 1 through stage 4 chronic kidney disease, or unspecified chronic kidney disease: Secondary | ICD-10-CM | POA: Diagnosis not present

## 2021-11-27 DIAGNOSIS — N1832 Chronic kidney disease, stage 3b: Secondary | ICD-10-CM | POA: Diagnosis not present

## 2021-11-27 DIAGNOSIS — R399 Unspecified symptoms and signs involving the genitourinary system: Secondary | ICD-10-CM | POA: Diagnosis not present

## 2021-11-27 DIAGNOSIS — R1310 Dysphagia, unspecified: Secondary | ICD-10-CM | POA: Diagnosis not present

## 2021-12-25 ENCOUNTER — Encounter: Payer: Self-pay | Admitting: Cardiology

## 2021-12-25 NOTE — Progress Notes (Deleted)
Cardiology Office Note   Date:  12/25/2021   ID:  Cristian Hayden, DOB 10-01-1940, MRN [id number]  PCP:  Norm Salt, PA  Cardiologist:   Rollene Rotunda, MD Referring:  ***  No chief complaint on file.   History of Present Illness:  Cristian Hayden is a 81 y.o. male who presents for who presents for follow up of coronary artery disease with history of CABG in 2003, (LIMA to LAD, SVG to PDA, SVG to OM1/OM2) PAD status post femorofemoral BP in [place] on 05/28/2008.   ***    Past Medical History:  Diagnosis Date   Arthritis    Bronchitis    CAD (coronary artery disease)    Diabetes mellitus without complication (HCC)    Hyperlipidemia    Hypertension    Peripheral vascular disease (HCC)    with claludication   PVD (peripheral vascular disease) (HCC)     Past Surgical History:  Procedure Laterality Date   CATARACT EXTRACTION  June 2013   Cataract Right eye   CORONARY ARTERY BYPASS GRAFT     LIMA to LAD, SVG to PDA, SVG to OM1/OM2   Femoral-Femoral BPGA  05/29/1999   Left profundoplasty  and Left Fem/Pop 05/28/2008     Current Outpatient Medications  Medication Sig Dispense Refill   ALPHAGAN P 0.1 % SOLN INT 1 GTT LEY BID     atorvastatin (LIPITOR) 40 MG tablet Take 40 mg by mouth daily.      BD PEN NEEDLE NANO 2ND GEN 32G X 4 MM MISC See admin instructions.     brimonidine (ALPHAGAN) 0.2 % ophthalmic solution 1 drop 2 (two) times daily.     carvedilol (COREG) 6.25 MG tablet TAKE 1 TABLET(6.25 MG) BY MOUTH TWICE DAILY WITH A MEAL 180 tablet 0   chlorthalidone (HYGROTON) 25 MG tablet Take 1 tablet (25 mg total) by mouth daily. 90 tablet 2   dorzolamide-timolol (COSOPT) 22.3-6.8 MG/ML ophthalmic solution Place 1 drop into both eyes 2 (two) times daily.  4   hydrocortisone (ANUSOL-HC) 2.5 % rectal cream Place rectally 2 (two) times daily.     Lancets (ONETOUCH DELICA PLUS LANCET30G) MISC 4 (four) times daily. for testing     latanoprost (XALATAN) 0.005 % ophthalmic  solution Place 1 drop into both eyes at bedtime.  4   metFORMIN (GLUCOPHAGE) 500 MG tablet TK 1 T PO  BID WITH MORNING AND EVENING MEALS     NOVOLOG FLEXPEN 100 UNIT/ML FlexPen SMARTSIG:2-15 Unit(s) IM 3 Times Daily     ONETOUCH ULTRA test strip 1 each by Other route 3 (three) times daily. for testing     Polyethyl Glycol-Propyl Glycol (SYSTANE OP) Apply to eye.     tamsulosin (FLOMAX) 0.4 MG CAPS capsule Take 0.8 mg by mouth daily. Reported on 07/10/2015     No current facility-administered medications for this visit.    Allergies:   Patient has no known allergies.    Social History:  The patient  reports that he has been smoking cigarettes. He has been smoking an average of .2 packs per day. He has never used smokeless tobacco. He reports that he does not drink alcohol and does not use drugs.   Family History:  The patient's ***family history includes Heart disease (age of onset: 31) in his father; Hypertension in his brother, father, mother, and sister.    ROS:  Please see the history of present illness.   Otherwise, review of systems are positive for {NONE  NIDPOEUMP:53614}.   All other systems are reviewed and negative.    PHYSICAL EXAM: VS:  There were no vitals taken for this visit. , BMI There is no height or weight on file to calculate BMI. GENERAL:  Well appearing HEENT:  Pupils equal round and reactive, fundi not visualized, oral mucosa unremarkable NECK:  No jugular venous distention, waveform within normal limits, carotid upstroke brisk and symmetric, no bruits, no thyromegaly LYMPHATICS:  No cervical, inguinal adenopathy LUNGS:  Clear to auscultation bilaterally BACK:  No CVA tenderness CHEST:  Unremarkable HEART:  PMI not displaced or sustained,S1 and S2 within normal limits, no S3, no S4, no clicks, no rubs, *** murmurs ABD:  Flat, positive bowel sounds normal in frequency in pitch, no bruits, no rebound, no guarding, no midline pulsatile mass, no hepatomegaly, no  splenomegaly EXT:  2 plus pulses throughout, no edema, no cyanosis no clubbing SKIN:  No rashes no nodules NEURO:  Cranial nerves II through XII grossly intact, motor grossly intact throughout PSYCH:  Cognitively intact, oriented to person place and time    EKG:  EKG {ACTION; IS/IS ERX:[id number]} ordered today. The ekg ordered today demonstrates ***   Recent Labs: 02/01/2021: ALT 24; BUN 32; Creatinine, Ser 1.94; Hemoglobin 10.0; Platelets 160; Potassium 5.4; Sodium 137    Lipid Panel    Component Value Date/Time   CHOL 116 11/17/2018 1125   TRIG 80 11/17/2018 1125   HDL 44 11/17/2018 1125   CHOLHDL 2.6 11/17/2018 1125   CHOLHDL 3 03/02/2013 0740   VLDL 18.6 03/02/2013 0740   LDLCALC 56 11/17/2018 1125      Wt Readings from Last 3 Encounters:  05/01/21 120 lb (54.4 kg)  12/27/20 125 lb (56.7 kg)  08/29/20 128 lb (58.1 kg)      Other studies Reviewed: Additional studies/ records that were reviewed today include: ***. Review of the above records demonstrates:  Please see elsewhere in the note.  ***   ASSESSMENT AND PLAN:  CAD:  ***  PVD:  ***    Current medicines are reviewed at length with the patient today.  The patient {ACTIONS; HAS/DOES NOT HAVE:19233} concerns regarding medicines.  The following changes have been made:  {PLAN; NO CHANGE:13088:s}  Labs/ tests ordered today include: *** No orders of the defined types were placed in this encounter.    Disposition:   FU with ***    Signed, Rollene Rotunda, MD  12/25/2021 8:09 PM    [facility]

## 2021-12-26 ENCOUNTER — Ambulatory Visit: Payer: No Typology Code available for payment source | Attending: Cardiology | Admitting: Cardiology

## 2021-12-26 DIAGNOSIS — I739 Peripheral vascular disease, unspecified: Secondary | ICD-10-CM

## 2021-12-26 DIAGNOSIS — I25118 Atherosclerotic heart disease of native coronary artery with other forms of angina pectoris: Secondary | ICD-10-CM

## 2022-01-09 ENCOUNTER — Other Ambulatory Visit: Payer: Self-pay | Admitting: Cardiology

## 2022-01-13 ENCOUNTER — Telehealth: Payer: Self-pay | Admitting: Cardiology

## 2022-01-13 MED ORDER — CHLORTHALIDONE 25 MG PO TABS: 25.0000 mg | ORAL_TABLET | Freq: Every day | ORAL | 3 refills | Status: DC

## 2022-01-13 NOTE — Telephone Encounter (Signed)
*  STAT* If patient is at the pharmacy, call can be transferred to refill team.   1. Which medications need to be refilled? (please list name of each medication and dose if known) chlorthalidone (HYGROTON) 25 MG tablet  2. Which pharmacy/location (including street and city if local pharmacy) is medication to be sent to? [facility] - [place] [address]  3. Do they need a 30 day or 90 day supply? 09

## 2022-01-21 DIAGNOSIS — Z961 Presence of intraocular lens: Secondary | ICD-10-CM | POA: Diagnosis not present

## 2022-01-21 DIAGNOSIS — H04123 Dry eye syndrome of bilateral lacrimal glands: Secondary | ICD-10-CM | POA: Diagnosis not present

## 2022-01-21 DIAGNOSIS — H401132 Primary open-angle glaucoma, bilateral, moderate stage: Secondary | ICD-10-CM | POA: Diagnosis not present

## 2022-01-21 DIAGNOSIS — H47011 Ischemic optic neuropathy, right eye: Secondary | ICD-10-CM | POA: Diagnosis not present

## 2022-01-21 DIAGNOSIS — H35033 Hypertensive retinopathy, bilateral: Secondary | ICD-10-CM | POA: Diagnosis not present

## 2022-01-21 DIAGNOSIS — H43822 Vitreomacular adhesion, left eye: Secondary | ICD-10-CM | POA: Diagnosis not present

## 2022-01-22 DIAGNOSIS — K5900 Constipation, unspecified: Secondary | ICD-10-CM | POA: Diagnosis not present

## 2022-01-22 DIAGNOSIS — Z794 Long term (current) use of insulin: Secondary | ICD-10-CM | POA: Diagnosis not present

## 2022-01-22 DIAGNOSIS — E782 Mixed hyperlipidemia: Secondary | ICD-10-CM | POA: Diagnosis not present

## 2022-01-22 DIAGNOSIS — Z0001 Encounter for general adult medical examination with abnormal findings: Secondary | ICD-10-CM | POA: Diagnosis not present

## 2022-01-22 DIAGNOSIS — J410 Simple chronic bronchitis: Secondary | ICD-10-CM | POA: Diagnosis not present

## 2022-01-22 DIAGNOSIS — I10 Essential (primary) hypertension: Secondary | ICD-10-CM | POA: Diagnosis not present

## 2022-01-22 DIAGNOSIS — E1122 Type 2 diabetes mellitus with diabetic chronic kidney disease: Secondary | ICD-10-CM | POA: Diagnosis not present

## 2022-01-29 ENCOUNTER — Other Ambulatory Visit: Payer: Self-pay | Admitting: Cardiology

## 2022-01-29 NOTE — Telephone Encounter (Signed)
*  STAT* If patient is at the pharmacy, call can be transferred to refill team.   1. Which medications need to be refilled? (please list name of each medication and dose if known)  chlorthalidone (HYGROTON) 25 MG tablet  2. Which pharmacy/location (including street and city if local pharmacy) is medication to be sent to? [facility] - [place] - [address]  3. Do they need a 30 day or 90 day supply?   90 day supply Previous Rx was sent to the incorrect pharmacy. Please transfer.

## 2022-01-29 NOTE — Telephone Encounter (Signed)
RX was already sent to [facility] 01/13/22... I left a message for the pt to call us back prior to resending RX since he has not been seen since 06/22/19 for a virtual visit.... needs to make an appt/ labs.

## 2022-02-04 NOTE — Telephone Encounter (Signed)
Spoke with pt, he prefers we talk to his daughter. Left message for simone to return the call.

## 2022-02-05 NOTE — Telephone Encounter (Signed)
Left message for daughter to call back -   1) patient needs an appt  with Dr Percival Spanish  2) calling to see if medication has arrived from [facility] - - Chlorthalidone 25 mg

## 2022-02-12 ENCOUNTER — Other Ambulatory Visit: Payer: Self-pay | Admitting: Cardiology

## 2022-02-13 ENCOUNTER — Telehealth: Payer: Self-pay | Admitting: Cardiology

## 2022-02-13 DIAGNOSIS — I10 Essential (primary) hypertension: Secondary | ICD-10-CM | POA: Diagnosis not present

## 2022-02-13 DIAGNOSIS — R630 Anorexia: Secondary | ICD-10-CM | POA: Diagnosis not present

## 2022-02-13 DIAGNOSIS — Z794 Long term (current) use of insulin: Secondary | ICD-10-CM | POA: Diagnosis not present

## 2022-02-13 DIAGNOSIS — N401 Enlarged prostate with lower urinary tract symptoms: Secondary | ICD-10-CM | POA: Diagnosis not present

## 2022-02-13 DIAGNOSIS — E785 Hyperlipidemia, unspecified: Secondary | ICD-10-CM | POA: Diagnosis not present

## 2022-02-13 DIAGNOSIS — Z1331 Encounter for screening for depression: Secondary | ICD-10-CM | POA: Diagnosis not present

## 2022-02-13 DIAGNOSIS — H409 Unspecified glaucoma: Secondary | ICD-10-CM | POA: Diagnosis not present

## 2022-02-16 DIAGNOSIS — N1832 Chronic kidney disease, stage 3b: Secondary | ICD-10-CM | POA: Diagnosis not present

## 2022-02-19 DIAGNOSIS — I129 Hypertensive chronic kidney disease with stage 1 through stage 4 chronic kidney disease, or unspecified chronic kidney disease: Secondary | ICD-10-CM | POA: Diagnosis not present

## 2022-02-19 DIAGNOSIS — R634 Abnormal weight loss: Secondary | ICD-10-CM | POA: Diagnosis not present

## 2022-02-19 DIAGNOSIS — R1310 Dysphagia, unspecified: Secondary | ICD-10-CM | POA: Diagnosis not present

## 2022-02-19 DIAGNOSIS — N1832 Chronic kidney disease, stage 3b: Secondary | ICD-10-CM | POA: Diagnosis not present

## 2022-02-19 DIAGNOSIS — R399 Unspecified symptoms and signs involving the genitourinary system: Secondary | ICD-10-CM | POA: Diagnosis not present

## 2022-03-06 ENCOUNTER — Other Ambulatory Visit: Payer: Self-pay | Admitting: Cardiology

## 2022-03-25 ENCOUNTER — Ambulatory Visit (INDEPENDENT_AMBULATORY_CARE_PROVIDER_SITE_OTHER): Payer: No Typology Code available for payment source | Admitting: Podiatry

## 2022-03-25 ENCOUNTER — Encounter: Payer: Self-pay | Admitting: Podiatry

## 2022-03-25 DIAGNOSIS — M79675 Pain in left toe(s): Secondary | ICD-10-CM

## 2022-03-25 DIAGNOSIS — B351 Tinea unguium: Secondary | ICD-10-CM | POA: Diagnosis not present

## 2022-03-25 DIAGNOSIS — M79674 Pain in right toe(s): Secondary | ICD-10-CM

## 2022-03-25 DIAGNOSIS — E1142 Type 2 diabetes mellitus with diabetic polyneuropathy: Secondary | ICD-10-CM | POA: Diagnosis not present

## 2022-03-25 DIAGNOSIS — I739 Peripheral vascular disease, unspecified: Secondary | ICD-10-CM

## 2022-03-25 NOTE — Progress Notes (Signed)
This patient returns to my office for at risk foot care.  This patient requires this care by a professional since this patient will be at risk due to having diabetes mellitus and pvd.  He presents to the officew with male caregiver.  This patient is unable to cut nails himself since the patient cannot reach his nails.These nails are painful walking and wearing shoes.  This patient presents for at risk foot care today.  General Appearance  Alert, conversant and in no acute stress.  Vascular  Dorsalis pedis and posterior tibial  pulses are weakly  palpable  bilaterally.  Capillary return is within normal limits  bilaterally. Cold feet  Bilaterally. Absent digital hair  B/L.  Neurologic  Senn-Weinstein monofilament wire test within normal limits  bilaterally. Muscle power within normal limits bilaterally.  Nails Thick disfigured discolored nails with subungual debris  from hallux to fifth toes bilaterally. No evidence of bacterial infection or drainage bilaterally.  Orthopedic  No limitations of motion  feet .  No crepitus or effusions noted.  No bony pathology or digital deformities noted.  Skin  normotropic skin with no porokeratosis noted bilaterally.  No signs of infections or ulcers noted.     Onychomycosis  Pain in right toes  Pain in left toes  Consent was obtained for treatment procedures.   Mechanical debridement of nails 1-5  bilaterally performed with a nail nipper.  Filed with dremel without incident.    Return office visit     3 months                 Told patient to return for periodic foot care and evaluation due to potential at risk complications.   Helane Gunther DPM

## 2022-04-06 ENCOUNTER — Other Ambulatory Visit: Payer: Self-pay | Admitting: Cardiology

## 2022-04-23 DIAGNOSIS — K5900 Constipation, unspecified: Secondary | ICD-10-CM | POA: Diagnosis not present

## 2022-04-23 DIAGNOSIS — E782 Mixed hyperlipidemia: Secondary | ICD-10-CM | POA: Diagnosis not present

## 2022-04-23 DIAGNOSIS — I10 Essential (primary) hypertension: Secondary | ICD-10-CM | POA: Diagnosis not present

## 2022-04-23 DIAGNOSIS — J410 Simple chronic bronchitis: Secondary | ICD-10-CM | POA: Diagnosis not present

## 2022-04-23 DIAGNOSIS — Z794 Long term (current) use of insulin: Secondary | ICD-10-CM | POA: Diagnosis not present

## 2022-04-23 DIAGNOSIS — E1122 Type 2 diabetes mellitus with diabetic chronic kidney disease: Secondary | ICD-10-CM | POA: Diagnosis not present

## 2022-05-22 DIAGNOSIS — I10 Essential (primary) hypertension: Secondary | ICD-10-CM | POA: Diagnosis not present

## 2022-05-22 DIAGNOSIS — Z0000 Encounter for general adult medical examination without abnormal findings: Secondary | ICD-10-CM | POA: Diagnosis not present

## 2022-05-22 DIAGNOSIS — K5900 Constipation, unspecified: Secondary | ICD-10-CM | POA: Diagnosis not present

## 2022-05-22 DIAGNOSIS — E782 Mixed hyperlipidemia: Secondary | ICD-10-CM | POA: Diagnosis not present

## 2022-05-22 DIAGNOSIS — J410 Simple chronic bronchitis: Secondary | ICD-10-CM | POA: Diagnosis not present

## 2022-05-22 DIAGNOSIS — Z794 Long term (current) use of insulin: Secondary | ICD-10-CM | POA: Diagnosis not present

## 2022-05-22 DIAGNOSIS — E1122 Type 2 diabetes mellitus with diabetic chronic kidney disease: Secondary | ICD-10-CM | POA: Diagnosis not present

## 2022-06-03 DIAGNOSIS — H43822 Vitreomacular adhesion, left eye: Secondary | ICD-10-CM | POA: Diagnosis not present

## 2022-06-03 DIAGNOSIS — Z961 Presence of intraocular lens: Secondary | ICD-10-CM | POA: Diagnosis not present

## 2022-06-03 DIAGNOSIS — H401132 Primary open-angle glaucoma, bilateral, moderate stage: Secondary | ICD-10-CM | POA: Diagnosis not present

## 2022-06-03 DIAGNOSIS — H47011 Ischemic optic neuropathy, right eye: Secondary | ICD-10-CM | POA: Diagnosis not present

## 2022-06-03 DIAGNOSIS — H35033 Hypertensive retinopathy, bilateral: Secondary | ICD-10-CM | POA: Diagnosis not present

## 2022-06-03 DIAGNOSIS — H04123 Dry eye syndrome of bilateral lacrimal glands: Secondary | ICD-10-CM | POA: Diagnosis not present

## 2022-06-05 DIAGNOSIS — N1832 Chronic kidney disease, stage 3b: Secondary | ICD-10-CM | POA: Diagnosis not present

## 2022-06-05 DIAGNOSIS — I129 Hypertensive chronic kidney disease with stage 1 through stage 4 chronic kidney disease, or unspecified chronic kidney disease: Secondary | ICD-10-CM | POA: Diagnosis not present

## 2022-06-19 DIAGNOSIS — I10 Essential (primary) hypertension: Secondary | ICD-10-CM | POA: Diagnosis not present

## 2022-06-19 DIAGNOSIS — E782 Mixed hyperlipidemia: Secondary | ICD-10-CM | POA: Diagnosis not present

## 2022-06-19 DIAGNOSIS — J410 Simple chronic bronchitis: Secondary | ICD-10-CM | POA: Diagnosis not present

## 2022-06-19 DIAGNOSIS — E1122 Type 2 diabetes mellitus with diabetic chronic kidney disease: Secondary | ICD-10-CM | POA: Diagnosis not present

## 2022-06-19 DIAGNOSIS — Z794 Long term (current) use of insulin: Secondary | ICD-10-CM | POA: Diagnosis not present

## 2022-06-19 DIAGNOSIS — K5900 Constipation, unspecified: Secondary | ICD-10-CM | POA: Diagnosis not present

## 2022-06-30 ENCOUNTER — Other Ambulatory Visit: Payer: Self-pay

## 2022-06-30 DIAGNOSIS — I6521 Occlusion and stenosis of right carotid artery: Secondary | ICD-10-CM

## 2022-06-30 DIAGNOSIS — I739 Peripheral vascular disease, unspecified: Secondary | ICD-10-CM

## 2022-07-16 ENCOUNTER — Ambulatory Visit (HOSPITAL_COMMUNITY): Payer: Medicare Other

## 2022-07-16 ENCOUNTER — Ambulatory Visit: Payer: Medicare Other | Admitting: Vascular Surgery

## 2022-07-24 ENCOUNTER — Ambulatory Visit: Payer: No Typology Code available for payment source | Admitting: Podiatry

## 2022-07-31 ENCOUNTER — Encounter: Payer: Self-pay | Admitting: Podiatry

## 2022-07-31 ENCOUNTER — Ambulatory Visit (INDEPENDENT_AMBULATORY_CARE_PROVIDER_SITE_OTHER): Payer: Medicare Other | Admitting: Podiatry

## 2022-07-31 DIAGNOSIS — M79674 Pain in right toe(s): Secondary | ICD-10-CM | POA: Diagnosis not present

## 2022-07-31 DIAGNOSIS — B351 Tinea unguium: Secondary | ICD-10-CM

## 2022-07-31 DIAGNOSIS — M79675 Pain in left toe(s): Secondary | ICD-10-CM

## 2022-07-31 DIAGNOSIS — E1142 Type 2 diabetes mellitus with diabetic polyneuropathy: Secondary | ICD-10-CM

## 2022-07-31 NOTE — Progress Notes (Addendum)
This patient returns to my office for at risk foot care.  This patient requires this care by a professional since this patient will be at risk due to having diabetes mellitus and pvd.  He presents to the officew with male caregiver.  This patient is unable to cut nails himself since the patient cannot reach his nails.These nails are painful walking and wearing shoes.  This patient presents for at risk foot care today.  General Appearance  Alert, conversant and in no acute stress.  Vascular  Dorsalis pedis and posterior tibial  pulses are weakly  palpable  bilaterally.  Capillary return is within normal limits  bilaterally. Cold feet  Bilaterally. Absent digital hair  B/L.  Neurologic  Senn-Weinstein monofilament wire test within normal limits  bilaterally. Muscle power within normal limits bilaterally.  Nails Thick disfigured discolored nails with subungual debris  from hallux to fifth toes bilaterally. No evidence of bacterial infection or drainage bilaterally.  Orthopedic  No limitations of motion  feet .  No crepitus or effusions noted. HAV  B/L.  Skin  normotropic skin with no porokeratosis noted bilaterally.  No signs of infections or ulcers noted.     Onychomycosis  Pain in right toes  Pain in left toes  Consent was obtained for treatment procedures.   Mechanical debridement of nails 1-5  bilaterally performed with a nail nipper.  Filed with dremel without incident.    Return office visit     3 months                 Told patient to return for periodic foot care and evaluation due to potential at risk complications.   Helane Gunther DPM

## 2022-08-20 ENCOUNTER — Ambulatory Visit (INDEPENDENT_AMBULATORY_CARE_PROVIDER_SITE_OTHER)
Admission: RE | Admit: 2022-08-20 | Discharge: 2022-08-20 | Disposition: A | Discharge status: Home / Self Care | Payer: Medicare Other | Source: Ambulatory Visit | Attending: Vascular Surgery | Admitting: Vascular Surgery

## 2022-08-20 ENCOUNTER — Ambulatory Visit (HOSPITAL_COMMUNITY)
Admission: RE | Admit: 2022-08-20 | Discharge: 2022-08-20 | Disposition: A | Discharge status: Home / Self Care | Payer: Medicare Other | Source: Ambulatory Visit | Attending: Vascular Surgery | Admitting: Vascular Surgery

## 2022-08-20 ENCOUNTER — Ambulatory Visit: Payer: Medicare Other | Admitting: Vascular Surgery

## 2022-08-20 ENCOUNTER — Encounter: Payer: Self-pay | Admitting: Vascular Surgery

## 2022-08-20 VITALS — BP 143/84 | HR 55 | Temp 97.8°F | Resp 20 | Ht 64.0 in | Wt 121.0 lb

## 2022-08-20 DIAGNOSIS — I6522 Occlusion and stenosis of left carotid artery: Secondary | ICD-10-CM | POA: Diagnosis not present

## 2022-08-20 DIAGNOSIS — I6521 Occlusion and stenosis of right carotid artery: Secondary | ICD-10-CM | POA: Diagnosis not present

## 2022-08-20 DIAGNOSIS — I739 Peripheral vascular disease, unspecified: Secondary | ICD-10-CM

## 2022-08-20 LAB — VAS US ABI WITH/WO TBI
Left ABI: 0.55
Right ABI: 0.71

## 2022-08-20 NOTE — Progress Notes (Signed)
REASON FOR VISIT:   Follow-up of carotid disease and peripheral arterial disease.  MEDICAL ISSUES:   PERIPHERAL ARTERIAL DISEASE: This patient has a chronically occluded right to left femoral-femoral graft.  He has evidence of multilevel arterial occlusive disease on exam.  His ABIs are stable and he is having minimal symptoms.  I encouraged him to stay as active as possible.  We discussed the importance of tobacco cessation.  We also discussed the importance of nutrition.  I have encouraged him to begin taking aspirin again as he had stopped that.  He is on a statin.  I have ordered follow-up ABIs in 1 year.  I explained that I will be retiring so he will be seen by the PA at that time.  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  He understands we would not consider left carotid endarterectomy  the stenosis progressed to greater than 80% or he became symptomatic.  I have asked him to begin taking aspirin again.  He had stopped this.  He is on a statin.  I ordered a follow-up carotid duplex scan in 1 year and he will be seen back at that time.  He knows to call sooner if he has problems.  HPI:   Cristian Hayden is a pleasant 82 y.o. male who I last saw on 05/01/2021.  He had a moderate 40 to 59% right carotid stenosis which had improved when he was seen last and was less than 39%.  He had a less than 39% stenosis on the left.  He was asymptomatic.  He was on aspirin and was on a statin.  I set him up for a 1 year follow-up carotid duplex scan.  Since his last visit, he denies any history of stroke, TIAs, expressive or receptive aphasia, or amaurosis fugax.  On examination he had evidence of multilevel arterial occlusive disease with a chronically occluded right to left femoral-femoral graft that was done by Dr. Liliane Bade in 2010.  I did not recommend an aggressive approach to his peripheral arterial disease.  We discussed tobacco cessation.  I  encouraged him to stay as active as possible.  Today, the patient does note some pain in both legs but this occurs simply with standing and sitting and with walking.  I do not get any clear-cut history of claudication although I think his activity is fairly limited.  He denies any history of rest pain or nonhealing ulcers.  He had been on aspirin but at some point discontinued this.  He is on a statin.  He smokes an occasional cigarette.    Past Medical History:  Diagnosis Date   Arthritis    Bronchitis    CAD (coronary artery disease)    Diabetes mellitus without complication    Hyperlipidemia    Hypertension    Peripheral vascular disease    with claludication    Family History  Problem Relation Age of Onset   Hypertension Mother    Heart disease Father 21       CAD   Hypertension Father    Hypertension Sister    Hypertension Brother     SOCIAL HISTORY: Social History   Tobacco Use   Smoking status: Some Days    Packs/day: .25    Types: Cigarettes   Smokeless tobacco: Never   Tobacco comments:    pt states he is trying, only smokes about 2-3 per day  Substance Use Topics  Alcohol use: No    Comment: former    No Known Allergies  Current Outpatient Medications  Medication Sig Dispense Refill   atorvastatin (LIPITOR) 40 MG tablet Take 40 mg by mouth daily.      BD PEN NEEDLE NANO 2ND GEN 32G X 4 MM MISC See admin instructions.     brimonidine (ALPHAGAN) 0.2 % ophthalmic solution 1 drop 2 (two) times daily.     carvedilol (COREG) 6.25 MG tablet TAKE 1 TABLET(6.25 MG) BY MOUTH TWICE DAILY WITH A MEAL 180 tablet 0   chlorthalidone (HYGROTON) 25 MG tablet TAKE ONE TABLET BY MOUTH DAILY AT 9AM. SCHEDULE AN APPOINTMENT FOR FURTHER REFILLS 30 tablet 0   dorzolamide-timolol (COSOPT) 22.3-6.8 MG/ML ophthalmic solution Place 1 drop into both eyes 2 (two) times daily.  4   Lancets (ONETOUCH DELICA PLUS LANCET30G) MISC 4 (four) times daily. for testing     latanoprost  (XALATAN) 0.005 % ophthalmic solution Place 1 drop into both eyes at bedtime.  4   LINZESS 145 MCG CAPS capsule Take 145 mcg by mouth daily.     NOVOLOG FLEXPEN 100 UNIT/ML FlexPen SMARTSIG:2-15 Unit(s) IM 3 Times Daily     ONETOUCH ULTRA test strip 1 each by Other route 3 (three) times daily. for testing     tamsulosin (FLOMAX) 0.4 MG CAPS capsule Take 0.8 mg by mouth daily. Reported on 07/10/2015     TRESIBA FLEXTOUCH 200 UNIT/ML FlexTouch Pen Inject 10 Units into the skin daily.     No current facility-administered medications for this visit.    REVIEW OF SYSTEMS:   denotes positive finding,  denotes negative finding Cardiac  Comments:  Chest pain or chest pressure:    Shortness of breath upon exertion:    Short of breath when lying flat:    Irregular heart rhythm:        Vascular    Pain in calf, thigh, or hip brought on by ambulation:    Pain in feet at night that wakes you up from your sleep:     Blood clot in your veins:    Leg swelling:         Pulmonary    Oxygen at home:    Productive cough:     Wheezing:         Neurologic    Sudden weakness in arms or legs:     Sudden numbness in arms or legs:     Sudden onset of difficulty speaking or slurred speech:    Temporary loss of vision in one eye:     Problems with dizziness:         Gastrointestinal    Blood in stool:     Vomited blood:         Genitourinary    Burning when urinating:     Blood in urine:        Psychiatric    Major depression:         Hematologic    Bleeding problems:    Problems with blood clotting too easily:        Skin    Rashes or ulcers:        Constitutional    Fever or chills:     PHYSICAL EXAM:   Vitals:   08/20/22 0924 08/20/22 0925  BP: 135/79 (!) 143/84  Pulse: (!) 55   Resp: 20   Temp: 97.8 F (36.6 C)   SpO2: 98%   Weight: 121 lb (54.9 kg)  Height: 5\' 4"  (1.626 m)     GENERAL: The patient is a well-nourished male, in no acute distress. The vital signs  are documented above. CARDIAC: There is a regular rate and rhythm.  VASCULAR: I do not detect carotid bruits. I cannot palpate femoral, popliteal, or pedal pulses bilaterally. He has a chronically occluded femoral-femoral graft. He has no significant lower extremity swelling. PULMONARY: There is good air exchange bilaterally without wheezing or rales. ABDOMEN: Soft and non-tender with normal pitched bowel sounds.  MUSCULOSKELETAL: There are no major deformities or cyanosis. NEUROLOGIC: No focal weakness or paresthesias are detected. SKIN: There are no ulcers or rashes noted. PSYCHIATRIC: The patient has a normal affect.  DATA:    CAROTID DUPLEX: I have independently interpreted his carotid duplex scan today.  On the right side there is a less than 39% stenosis.  The right vertebral artery is patent with antegrade flow.  On the left side there is a 40 to 59% stenosis.  The left vertebral artery is patent with antegrade flow.  ARTERIAL DOPPLER STUDY: I have independently interpreted his arterial Doppler study today.  On the right side there is a monophasic posterior tibial signal and dorsalis pedis signal.  ABI is 71%.  Toe pressure 70 mmHg.  On the left side there is a monophasic posterior tibial and dorsalis pedis signal.  ABIs 55%.  Toe pressures 42 mmHg.  Waverly Ferrari [facility] [phone]

## 2022-08-31 ENCOUNTER — Other Ambulatory Visit: Payer: Self-pay

## 2022-08-31 DIAGNOSIS — I739 Peripheral vascular disease, unspecified: Secondary | ICD-10-CM

## 2022-08-31 DIAGNOSIS — I6522 Occlusion and stenosis of left carotid artery: Secondary | ICD-10-CM

## 2022-08-31 DIAGNOSIS — I6521 Occlusion and stenosis of right carotid artery: Secondary | ICD-10-CM

## 2022-09-06 ENCOUNTER — Emergency Department (HOSPITAL_COMMUNITY)
Admission: EM | Admit: 2022-09-06 | Discharge: 2022-09-06 | Disposition: A | Discharge status: Home / Self Care | Payer: Medicare Other | Attending: Emergency Medicine | Admitting: Emergency Medicine

## 2022-09-06 ENCOUNTER — Encounter (HOSPITAL_COMMUNITY): Payer: Self-pay | Admitting: Pharmacy Technician

## 2022-09-06 ENCOUNTER — Other Ambulatory Visit: Payer: Self-pay

## 2022-09-06 ENCOUNTER — Emergency Department (HOSPITAL_COMMUNITY): Payer: Medicare Other

## 2022-09-06 DIAGNOSIS — R109 Unspecified abdominal pain: Secondary | ICD-10-CM | POA: Diagnosis not present

## 2022-09-06 DIAGNOSIS — E119 Type 2 diabetes mellitus without complications: Secondary | ICD-10-CM | POA: Insufficient documentation

## 2022-09-06 DIAGNOSIS — Z79899 Other long term (current) drug therapy: Secondary | ICD-10-CM | POA: Diagnosis not present

## 2022-09-06 DIAGNOSIS — J9809 Other diseases of bronchus, not elsewhere classified: Secondary | ICD-10-CM | POA: Diagnosis not present

## 2022-09-06 DIAGNOSIS — S199XXA Unspecified injury of neck, initial encounter: Secondary | ICD-10-CM | POA: Diagnosis not present

## 2022-09-06 DIAGNOSIS — Y92410 Unspecified street and highway as the place of occurrence of the external cause: Secondary | ICD-10-CM | POA: Insufficient documentation

## 2022-09-06 DIAGNOSIS — R0789 Other chest pain: Secondary | ICD-10-CM | POA: Diagnosis not present

## 2022-09-06 DIAGNOSIS — I2510 Atherosclerotic heart disease of native coronary artery without angina pectoris: Secondary | ICD-10-CM | POA: Diagnosis not present

## 2022-09-06 DIAGNOSIS — R079 Chest pain, unspecified: Secondary | ICD-10-CM | POA: Insufficient documentation

## 2022-09-06 DIAGNOSIS — Z794 Long term (current) use of insulin: Secondary | ICD-10-CM | POA: Diagnosis not present

## 2022-09-06 DIAGNOSIS — S0990XA Unspecified injury of head, initial encounter: Secondary | ICD-10-CM | POA: Diagnosis not present

## 2022-09-06 DIAGNOSIS — K8020 Calculus of gallbladder without cholecystitis without obstruction: Secondary | ICD-10-CM | POA: Diagnosis not present

## 2022-09-06 DIAGNOSIS — I10 Essential (primary) hypertension: Secondary | ICD-10-CM | POA: Insufficient documentation

## 2022-09-06 DIAGNOSIS — M542 Cervicalgia: Secondary | ICD-10-CM | POA: Diagnosis not present

## 2022-09-06 DIAGNOSIS — N289 Disorder of kidney and ureter, unspecified: Secondary | ICD-10-CM | POA: Diagnosis not present

## 2022-09-06 DIAGNOSIS — J432 Centrilobular emphysema: Secondary | ICD-10-CM | POA: Diagnosis not present

## 2022-09-06 DIAGNOSIS — R001 Bradycardia, unspecified: Secondary | ICD-10-CM | POA: Diagnosis not present

## 2022-09-06 LAB — HEPATIC FUNCTION PANEL
ALT: 15 U/L (ref 0–44)
AST: 18 U/L (ref 15–41)
Albumin: 3.6 g/dL (ref 3.5–5.0)
Alkaline Phosphatase: 100 U/L (ref 38–126)
Bilirubin, Direct: 0.1 mg/dL (ref 0.0–0.2)
Indirect Bilirubin: 0.7 mg/dL (ref 0.3–0.9)
Total Bilirubin: 0.8 mg/dL (ref 0.3–1.2)
Total Protein: 7.1 g/dL (ref 6.5–8.1)

## 2022-09-06 LAB — BASIC METABOLIC PANEL
Anion gap: 10 (ref 5–15)
BUN: 21 mg/dL (ref 8–23)
CO2: 21 mmol/L — ABNORMAL LOW (ref 22–32)
Calcium: 9.3 mg/dL (ref 8.9–10.3)
Chloride: 105 mmol/L (ref 98–111)
Creatinine, Ser: 1.93 mg/dL — ABNORMAL HIGH (ref 0.61–1.24)
GFR, Estimated: 34 mL/min — ABNORMAL LOW (ref >60)
Glucose, Bld: 103 mg/dL — ABNORMAL HIGH (ref 70–99)
Potassium: 4.1 mmol/L (ref 3.5–5.1)
Sodium: 136 mmol/L (ref 135–145)

## 2022-09-06 LAB — CBC
HCT: 39.2 % (ref 39.0–52.0)
Hemoglobin: 12.3 g/dL — ABNORMAL LOW (ref 13.0–17.0)
MCH: 29.4 pg (ref 26.0–34.0)
MCHC: 31.4 g/dL (ref 30.0–36.0)
MCV: 93.8 fL (ref 80.0–100.0)
Platelets: 157 10*3/uL (ref 150–400)
RBC: 4.18 MIL/uL — ABNORMAL LOW (ref 4.22–5.81)
RDW: 14.4 % (ref 11.5–15.5)
WBC: 5 10*3/uL (ref 4.0–10.5)
nRBC: 0 % (ref 0.0–0.2)

## 2022-09-06 LAB — PROTIME-INR
INR: 1.1 (ref 0.8–1.2)
Prothrombin Time: 14 seconds (ref 11.4–15.2)

## 2022-09-06 LAB — TROPONIN I (HIGH SENSITIVITY)
Troponin I (High Sensitivity): 10 ng/L (ref <18)
Troponin I (High Sensitivity): 9 ng/L (ref <18)

## 2022-09-06 LAB — MAGNESIUM: Magnesium: 2 mg/dL (ref 1.7–2.4)

## 2022-09-06 LAB — LACTIC ACID, PLASMA: Lactic Acid, Venous: 0.9 mmol/L (ref 0.5–1.9)

## 2022-09-06 NOTE — Discharge Instructions (Addendum)
The lab work and imaging studies done in the emergency department are reassuring.  Take Tylenol as needed for pain and soreness.  Return to the emergency department for any new or worsening symptoms of concern.
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
## 2022-11-02 ENCOUNTER — Ambulatory Visit (INDEPENDENT_AMBULATORY_CARE_PROVIDER_SITE_OTHER): Payer: Medicare Other | Admitting: Podiatry

## 2022-11-02 ENCOUNTER — Encounter: Payer: Self-pay | Admitting: Podiatry

## 2022-11-02 DIAGNOSIS — M79675 Pain in left toe(s): Secondary | ICD-10-CM

## 2022-11-02 DIAGNOSIS — M79674 Pain in right toe(s): Secondary | ICD-10-CM | POA: Diagnosis not present

## 2022-11-02 DIAGNOSIS — I739 Peripheral vascular disease, unspecified: Secondary | ICD-10-CM | POA: Diagnosis not present

## 2022-11-02 DIAGNOSIS — E1142 Type 2 diabetes mellitus with diabetic polyneuropathy: Secondary | ICD-10-CM | POA: Diagnosis not present

## 2022-11-02 DIAGNOSIS — B351 Tinea unguium: Secondary | ICD-10-CM | POA: Diagnosis not present

## 2022-11-02 NOTE — Progress Notes (Signed)
This patient returns to my office for at risk foot care.  This patient requires this care by a professional since this patient will be at risk due to having diabetes mellitus and pvd.    This patient is unable to cut nails himself since the patient cannot reach his nails.These nails are painful walking and wearing shoes.  This patient presents for at risk foot care today.  General Appearance  Alert, conversant and in no acute stress.  Vascular  Dorsalis pedis and posterior tibial  pulses are weakly  palpable  bilaterally.  Capillary return is within normal limits  bilaterally. Cold feet  Bilaterally. Absent digital hair  B/L.  Neurologic  Senn-Weinstein monofilament wire test within normal limits  bilaterally. Muscle power within normal limits bilaterally.  Nails Thick disfigured discolored nails with subungual debris  from hallux to fifth toes bilaterally. No evidence of bacterial infection or drainage bilaterally.  Orthopedic  No limitations of motion  feet .  No crepitus or effusions noted. HAV  B/L.  Skin  normotropic skin with no porokeratosis noted bilaterally.  No signs of infections or ulcers noted.     Onychomycosis  Pain in right toes  Pain in left toes  Consent was obtained for treatment procedures.   Mechanical debridement of nails 1-5  bilaterally performed with a nail nipper.  Filed with dremel without incident.    Return office visit     3 months                 Told patient to return for periodic foot care and evaluation due to potential at risk complications.   Helane Gunther DPM

## 2022-12-15 ENCOUNTER — Ambulatory Visit: Payer: Medicare Other | Admitting: Nurse Practitioner
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
## 2022-12-21 DIAGNOSIS — K5900 Constipation, unspecified: Secondary | ICD-10-CM | POA: Diagnosis not present

## 2022-12-21 DIAGNOSIS — M545 Low back pain, unspecified: Secondary | ICD-10-CM | POA: Diagnosis not present

## 2022-12-21 DIAGNOSIS — E782 Mixed hyperlipidemia: Secondary | ICD-10-CM | POA: Diagnosis not present

## 2022-12-21 DIAGNOSIS — I10 Essential (primary) hypertension: Secondary | ICD-10-CM | POA: Diagnosis not present

## 2022-12-21 DIAGNOSIS — J410 Simple chronic bronchitis: Secondary | ICD-10-CM | POA: Diagnosis not present

## 2022-12-21 DIAGNOSIS — E1122 Type 2 diabetes mellitus with diabetic chronic kidney disease: Secondary | ICD-10-CM | POA: Diagnosis not present

## 2022-12-21 DIAGNOSIS — Z794 Long term (current) use of insulin: Secondary | ICD-10-CM | POA: Diagnosis not present

## 2023-01-04 DIAGNOSIS — J410 Simple chronic bronchitis: Secondary | ICD-10-CM | POA: Diagnosis not present

## 2023-01-04 DIAGNOSIS — M545 Low back pain, unspecified: Secondary | ICD-10-CM | POA: Diagnosis not present

## 2023-01-04 DIAGNOSIS — E1122 Type 2 diabetes mellitus with diabetic chronic kidney disease: Secondary | ICD-10-CM | POA: Diagnosis not present

## 2023-01-04 DIAGNOSIS — Z794 Long term (current) use of insulin: Secondary | ICD-10-CM | POA: Diagnosis not present

## 2023-01-04 DIAGNOSIS — K5900 Constipation, unspecified: Secondary | ICD-10-CM | POA: Diagnosis not present

## 2023-01-04 DIAGNOSIS — E782 Mixed hyperlipidemia: Secondary | ICD-10-CM | POA: Diagnosis not present

## 2023-01-04 DIAGNOSIS — I10 Essential (primary) hypertension: Secondary | ICD-10-CM | POA: Diagnosis not present

## 2023-01-04 DIAGNOSIS — D649 Anemia, unspecified: Secondary | ICD-10-CM | POA: Diagnosis not present

## 2023-01-14 DIAGNOSIS — N1832 Chronic kidney disease, stage 3b: Secondary | ICD-10-CM | POA: Diagnosis not present

## 2023-01-14 DIAGNOSIS — I129 Hypertensive chronic kidney disease with stage 1 through stage 4 chronic kidney disease, or unspecified chronic kidney disease: Secondary | ICD-10-CM | POA: Diagnosis not present

## 2023-01-25 DIAGNOSIS — E1122 Type 2 diabetes mellitus with diabetic chronic kidney disease: Secondary | ICD-10-CM | POA: Diagnosis not present

## 2023-01-25 DIAGNOSIS — Z0001 Encounter for general adult medical examination with abnormal findings: Secondary | ICD-10-CM | POA: Diagnosis not present

## 2023-01-25 DIAGNOSIS — I10 Essential (primary) hypertension: Secondary | ICD-10-CM | POA: Diagnosis not present

## 2023-01-25 DIAGNOSIS — E782 Mixed hyperlipidemia: Secondary | ICD-10-CM | POA: Diagnosis not present

## 2023-01-25 DIAGNOSIS — J410 Simple chronic bronchitis: Secondary | ICD-10-CM | POA: Diagnosis not present

## 2023-01-25 DIAGNOSIS — Z794 Long term (current) use of insulin: Secondary | ICD-10-CM | POA: Diagnosis not present

## 2023-02-03 ENCOUNTER — Ambulatory Visit: Payer: Medicare Other | Admitting: Podiatry

## 2023-02-17 DIAGNOSIS — J410 Simple chronic bronchitis: Secondary | ICD-10-CM | POA: Diagnosis not present

## 2023-02-17 DIAGNOSIS — E782 Mixed hyperlipidemia: Secondary | ICD-10-CM | POA: Diagnosis not present

## 2023-02-17 DIAGNOSIS — R413 Other amnesia: Secondary | ICD-10-CM | POA: Diagnosis not present

## 2023-02-17 DIAGNOSIS — K5900 Constipation, unspecified: Secondary | ICD-10-CM | POA: Diagnosis not present

## 2023-02-17 DIAGNOSIS — Z794 Long term (current) use of insulin: Secondary | ICD-10-CM | POA: Diagnosis not present

## 2023-02-17 DIAGNOSIS — I10 Essential (primary) hypertension: Secondary | ICD-10-CM | POA: Diagnosis not present

## 2023-02-17 DIAGNOSIS — M542 Cervicalgia: Secondary | ICD-10-CM | POA: Diagnosis not present

## 2023-02-17 DIAGNOSIS — E1122 Type 2 diabetes mellitus with diabetic chronic kidney disease: Secondary | ICD-10-CM | POA: Diagnosis not present

## 2023-02-24 ENCOUNTER — Ambulatory Visit: Payer: Medicare Other | Admitting: Podiatry

## 2023-03-03 ENCOUNTER — Encounter: Payer: Self-pay | Admitting: Physician Assistant

## 2023-03-03 ENCOUNTER — Ambulatory Visit: Payer: Medicare Other | Admitting: Physician Assistant

## 2023-03-03 VITALS — BP 146/82 | HR 56 | Ht 65.0 in | Wt 119.4 lb

## 2023-03-03 DIAGNOSIS — R1032 Left lower quadrant pain: Secondary | ICD-10-CM

## 2023-03-03 DIAGNOSIS — K5909 Other constipation: Secondary | ICD-10-CM

## 2023-03-03 MED ORDER — LUBIPROSTONE 24 MCG PO CAPS: 24.0000 ug | ORAL_CAPSULE | Freq: Two times a day (BID) | ORAL | 6 refills | Status: DC

## 2023-03-03 NOTE — Patient Instructions (Signed)
Stop taking Linzess.   We have sent the following medications to your pharmacy for you to pick up at your convenience: Amitiza 24 mcg twice daily with food.   _______________________________________________________  If your blood pressure at your visit was 140/90 or greater, please contact your primary care physician to follow up on this.  _______________________________________________________  If you are age 82 or older, your body mass index should be between 23-30. Your Body mass index is 19.87 kg/m. If this is out of the aforementioned range listed, please consider follow up with your Primary Care Provider.  If you are age 54 or younger, your body mass index should be between 19-25. Your Body mass index is 19.87 kg/m. If this is out of the aformentioned range listed, please consider follow up with your Primary Care Provider.   ________________________________________________________  The [facility] providers would like to encourage you to use [facility] to communicate with providers for non-urgent requests or questions.  Due to long hold times on the telephone, sending your provider a message by [facility] may be a faster and more efficient way to get a response.  Please allow 48 business hours for a response.  Please remember that this is for non-urgent requests.  _______________________________________________________

## 2023-03-03 NOTE — Progress Notes (Signed)
Attending Physician's Attestation   I have reviewed the chart.   I agree with the Advanced Practitioner's note, impression, and recommendations with any updates as below. Certainly up to the patient to decide his overall medical journey, but with history of anemia (albeit normocytic) and his other constitutional symptoms, an endoscopic evaluation is very reasonable.  If this is still concerning for patient, then hopefully he will at least do imaging as outlined and discussed in the past.   Corliss Parish, MD [facility] Advanced Endoscopy Office # [phone]

## 2023-03-03 NOTE — Progress Notes (Signed)
Chief Complaint: Constipation and left sided abdominal pain  HPI:    Cristian Hayden is an 82 y/o  AA male, previously assigned to Dr. Orvan Falconer, with a past medical history as listed below including CAD, diabetes and multiple others, he presents to clinic today for complaint of constipation and left-sided abdominal pain.    08/29/2020 patient seen in clinic by me for blood in his stool and some weight loss.  He described a colonoscopy back in the 2000's but was not sure.  At that point on chronic laxative every 3 to 5 days for constipation.  This was unchanged.  He had not seen any further blood in the stool since using Hydrocortisone suppositories.  Offered further workup for weight loss and blood in stool with a CT abdomen pelvis or EGD/colonoscopy but patient declined.    Today, patient presents to clinic accompanied by his daughter.  Explained that over the past year or 2 he has had worsening constipation and tells me sometimes it takes 5 to 6 days for him to have a bowel movement.  He does continually pass gas during this time.  Previously using enemas over the past 20 to 30 years which are now not working like they used to.  His PCP started him on Linzess 145 mcg daily but this does not help at all.  Does tell me that he has some left lower quadrant discomfort but this is better when he is able to pass a stool.  Denies any other acute complaints or concerns.  He has not lost any further weight.    Denies fever, chills, blood in his stool or symptoms that awaken him from sleep.  Past Medical History:  Diagnosis Date   Arthritis    Bronchitis    CAD (coronary artery disease)    Diabetes mellitus without complication (HCC)    Hyperlipidemia    Hypertension    Peripheral vascular disease (HCC)    with claludication    Past Surgical History:  Procedure Laterality Date   CATARACT EXTRACTION  June 2013   Cataract Right eye   CORONARY ARTERY BYPASS GRAFT     LIMA to LAD, SVG to PDA, SVG to OM1/OM2    Femoral-Femoral BPGA  05/29/1999   Left profundoplasty  and Left Fem/Pop 05/28/2008    Current Outpatient Medications  Medication Sig Dispense Refill   atorvastatin (LIPITOR) 40 MG tablet Take 40 mg by mouth daily.      BD PEN NEEDLE NANO 2ND GEN 32G X 4 MM MISC See admin instructions.     brimonidine (ALPHAGAN) 0.2 % ophthalmic solution 1 drop 2 (two) times daily.     carvedilol (COREG) 6.25 MG tablet TAKE 1 TABLET(6.25 MG) BY MOUTH TWICE DAILY WITH A MEAL 180 tablet 0   chlorthalidone (HYGROTON) 25 MG tablet TAKE ONE TABLET BY MOUTH DAILY AT 9AM. SCHEDULE AN APPOINTMENT FOR FURTHER REFILLS 30 tablet 0   dorzolamide-timolol (COSOPT) 22.3-6.8 MG/ML ophthalmic solution Place 1 drop into both eyes 2 (two) times daily.  4   Lancets (ONETOUCH DELICA PLUS LANCET30G) MISC 4 (four) times daily. for testing     latanoprost (XALATAN) 0.005 % ophthalmic solution Place 1 drop into both eyes at bedtime.  4   LINZESS 145 MCG CAPS capsule Take 145 mcg by mouth daily.     NOVOLOG FLEXPEN 100 UNIT/ML FlexPen SMARTSIG:2-15 Unit(s) IM 3 Times Daily     ONETOUCH ULTRA test strip 1 each by Other route 3 (three) times daily. for testing  tamsulosin (FLOMAX) 0.4 MG CAPS capsule Take 0.8 mg by mouth daily. Reported on 07/10/2015     TRESIBA FLEXTOUCH 200 UNIT/ML FlexTouch Pen Inject 10 Units into the skin daily.     No current facility-administered medications for this visit.    Allergies as of 03/03/2023   (No Known Allergies)    Family History  Problem Relation Age of Onset   Hypertension Mother    Heart disease Father 75       CAD   Hypertension Father    Hypertension Sister    Hypertension Brother     Social History   Socioeconomic History   Marital status: Married    Spouse name: Not on file   Number of children: 4   Years of education: Not on file   Highest education level: Not on file  Occupational History   Not on file  Tobacco Use   Smoking status: Some Days    Current packs/day:  0.25    Types: Cigarettes   Smokeless tobacco: Never   Tobacco comments:    pt states he is trying, only smokes about 2-3 per day  Vaping Use   Vaping status: Never Used  Substance and Sexual Activity   Alcohol use: No    Comment: former   Drug use: No   Sexual activity: Not on file  Other Topics Concern   Not on file  Social History Narrative   Lives with wife.     Social Determinants of Health   Financial Resource Strain: Not on file  Food Insecurity: Not on file  Transportation Needs: Not on file  Physical Activity: Not on file  Stress: Not on file  Social Connections: Not on file  Intimate Partner Violence: Not on file    Review of Systems:    Constitutional: No weight loss, fever or chills Cardiovascular: No chest pain Respiratory: No SOB  Gastrointestinal: See HPI and otherwise negative   Physical Exam:  Vital signs: BP (!) 146/82   Pulse (!) 56   Ht 5\' 5"  (1.651 m)   Wt 119 lb 6 oz (54.1 kg)   BMI 19.87 kg/m    Constitutional:   Pleasant AA male appears to be in NAD, Well developed, Well nourished, alert and cooperative Respiratory: Respirations even and unlabored. Lungs clear to auscultation bilaterally.   No wheezes, crackles, or rhonchi.  Cardiovascular: Normal S1, S2. No MRG. Regular rate and rhythm. No peripheral edema, cyanosis or pallor.  Gastrointestinal:  Soft, nondistended, nontender. No rebound or guarding. Normal bowel sounds. No appreciable masses or hepatomegaly. Rectal:  Not performed.  Psychiatric: Demonstrates good judgement and reason without abnormal affect or behaviors.  RELEVANT LABS AND IMAGING: CBC    Component Value Date/Time   WBC 5.0 09/06/2022 1028   RBC 4.18 (L) 09/06/2022 1028   HGB 12.3 (L) 09/06/2022 1028   HGB 13.8 11/17/2018 1125   HCT 39.2 09/06/2022 1028   HCT 40.2 11/17/2018 1125   PLT 157 09/06/2022 1028   PLT 177 11/17/2018 1125   MCV 93.8 09/06/2022 1028   MCV 88 11/17/2018 1125   MCH 29.4 09/06/2022 1028    MCHC 31.4 09/06/2022 1028   RDW 14.4 09/06/2022 1028   RDW 13.6 11/17/2018 1125   LYMPHSABS 2.0 12/27/2020 0804   LYMPHSABS 2.7 11/17/2018 1125   MONOABS 0.7 12/27/2020 0804   EOSABS 0.1 12/27/2020 0804   EOSABS 0.1 11/17/2018 1125   BASOSABS 0.0 12/27/2020 0804   BASOSABS 0.0 11/17/2018 1125  CMP     Component Value Date/Time   NA 136 09/06/2022 1028   NA 146 (H) 11/17/2018 1125   K 4.1 09/06/2022 1028   CL 105 09/06/2022 1028   CO2 21 (L) 09/06/2022 1028   GLUCOSE 103 (H) 09/06/2022 1028   BUN 21 09/06/2022 1028   BUN 10 11/17/2018 1125   CREATININE 1.93 (H) 09/06/2022 1028   CALCIUM 9.3 09/06/2022 1028   PROT 7.1 09/06/2022 1127   PROT 7.0 11/17/2018 1125   ALBUMIN 3.6 09/06/2022 1127   ALBUMIN 4.7 11/17/2018 1125   AST 18 09/06/2022 1127   ALT 15 09/06/2022 1127   ALKPHOS 100 09/06/2022 1127   BILITOT 0.8 09/06/2022 1127   BILITOT 0.5 11/17/2018 1125   GFRNONAA 34 (L) 09/06/2022 1028   GFRAA 99 11/17/2018 1125    Assessment: 1.  Constipation: Chronic for the patient, but worse over the past year or 2; consider IBS versus slow transit versus other  Plan: 1.  Again discussed possible procedures including CT or colonoscopy for further evaluation.  Patient would like to try medication first. 2.  Low threshold to order a CT of the abdomen/ pelvis if constipation does not improve with change in laxative. 3.  Discontinue Linzess and start Amitiza 24 mcg twice daily with food #60 with 5 refills. 4.  Patient and/or daughter will let me know how he is doing with this new medicine over the next 3 to 4 weeks, again if no improvement would recommend abdominal imaging. 5.  Patient to follow in clinic with me in 2 to 3 months or sooner if necessary.  Assigned to Dr. Meridee Score.    Hyacinth Meeker, PA-C [facility] 03/03/2023, 9:04 AM  Cc: Norm Salt, PA

## 2023-03-22 DIAGNOSIS — H35033 Hypertensive retinopathy, bilateral: Secondary | ICD-10-CM | POA: Diagnosis not present

## 2023-03-22 DIAGNOSIS — H43822 Vitreomacular adhesion, left eye: Secondary | ICD-10-CM | POA: Diagnosis not present

## 2023-03-22 DIAGNOSIS — Z961 Presence of intraocular lens: Secondary | ICD-10-CM | POA: Diagnosis not present

## 2023-03-22 DIAGNOSIS — H04123 Dry eye syndrome of bilateral lacrimal glands: Secondary | ICD-10-CM | POA: Diagnosis not present

## 2023-03-22 DIAGNOSIS — H401132 Primary open-angle glaucoma, bilateral, moderate stage: Secondary | ICD-10-CM | POA: Diagnosis not present

## 2023-03-22 DIAGNOSIS — H47011 Ischemic optic neuropathy, right eye: Secondary | ICD-10-CM | POA: Diagnosis not present

## 2023-04-16 ENCOUNTER — Observation Stay (HOSPITAL_COMMUNITY)
Admission: EM | Admit: 2023-04-16 | Discharge: 2023-04-18 | Disposition: A | Discharge status: Home / Self Care | Payer: Medicare Other | Attending: Internal Medicine | Admitting: Internal Medicine

## 2023-04-16 ENCOUNTER — Other Ambulatory Visit: Payer: Self-pay

## 2023-04-16 ENCOUNTER — Encounter (HOSPITAL_COMMUNITY): Payer: Self-pay

## 2023-04-16 ENCOUNTER — Emergency Department (HOSPITAL_COMMUNITY): Payer: Medicare Other

## 2023-04-16 DIAGNOSIS — E782 Mixed hyperlipidemia: Secondary | ICD-10-CM | POA: Diagnosis not present

## 2023-04-16 DIAGNOSIS — Z794 Long term (current) use of insulin: Secondary | ICD-10-CM | POA: Diagnosis not present

## 2023-04-16 DIAGNOSIS — D638 Anemia in other chronic diseases classified elsewhere: Secondary | ICD-10-CM | POA: Diagnosis present

## 2023-04-16 DIAGNOSIS — I10 Essential (primary) hypertension: Secondary | ICD-10-CM | POA: Diagnosis not present

## 2023-04-16 DIAGNOSIS — Z720 Tobacco use: Secondary | ICD-10-CM | POA: Diagnosis present

## 2023-04-16 DIAGNOSIS — I6521 Occlusion and stenosis of right carotid artery: Secondary | ICD-10-CM | POA: Diagnosis not present

## 2023-04-16 DIAGNOSIS — I700 Atherosclerosis of aorta: Secondary | ICD-10-CM | POA: Insufficient documentation

## 2023-04-16 DIAGNOSIS — N1832 Chronic kidney disease, stage 3b: Secondary | ICD-10-CM | POA: Diagnosis not present

## 2023-04-16 DIAGNOSIS — D631 Anemia in chronic kidney disease: Secondary | ICD-10-CM | POA: Insufficient documentation

## 2023-04-16 DIAGNOSIS — F17210 Nicotine dependence, cigarettes, uncomplicated: Secondary | ICD-10-CM | POA: Insufficient documentation

## 2023-04-16 DIAGNOSIS — I129 Hypertensive chronic kidney disease with stage 1 through stage 4 chronic kidney disease, or unspecified chronic kidney disease: Secondary | ICD-10-CM | POA: Insufficient documentation

## 2023-04-16 DIAGNOSIS — E119 Type 2 diabetes mellitus without complications: Secondary | ICD-10-CM | POA: Diagnosis not present

## 2023-04-16 DIAGNOSIS — R42 Dizziness and giddiness: Principal | ICD-10-CM

## 2023-04-16 DIAGNOSIS — I6523 Occlusion and stenosis of bilateral carotid arteries: Secondary | ICD-10-CM | POA: Diagnosis not present

## 2023-04-16 DIAGNOSIS — E1122 Type 2 diabetes mellitus with diabetic chronic kidney disease: Secondary | ICD-10-CM | POA: Insufficient documentation

## 2023-04-16 DIAGNOSIS — I2510 Atherosclerotic heart disease of native coronary artery without angina pectoris: Secondary | ICD-10-CM | POA: Insufficient documentation

## 2023-04-16 DIAGNOSIS — Z951 Presence of aortocoronary bypass graft: Secondary | ICD-10-CM | POA: Diagnosis not present

## 2023-04-16 DIAGNOSIS — Z79899 Other long term (current) drug therapy: Secondary | ICD-10-CM | POA: Diagnosis not present

## 2023-04-16 DIAGNOSIS — I6782 Cerebral ischemia: Secondary | ICD-10-CM | POA: Diagnosis not present

## 2023-04-16 DIAGNOSIS — E1169 Type 2 diabetes mellitus with other specified complication: Secondary | ICD-10-CM | POA: Diagnosis not present

## 2023-04-16 DIAGNOSIS — H814 Vertigo of central origin: Secondary | ICD-10-CM | POA: Diagnosis not present

## 2023-04-16 DIAGNOSIS — J432 Centrilobular emphysema: Secondary | ICD-10-CM | POA: Diagnosis not present

## 2023-04-16 DIAGNOSIS — I639 Cerebral infarction, unspecified: Secondary | ICD-10-CM | POA: Diagnosis not present

## 2023-04-16 DIAGNOSIS — E785 Hyperlipidemia, unspecified: Secondary | ICD-10-CM | POA: Diagnosis present

## 2023-04-16 LAB — BASIC METABOLIC PANEL
Anion gap: 6 (ref 5–15)
BUN: 15 mg/dL (ref 8–23)
CO2: 23 mmol/L (ref 22–32)
Calcium: 8.8 mg/dL — ABNORMAL LOW (ref 8.9–10.3)
Chloride: 109 mmol/L (ref 98–111)
Creatinine, Ser: 1.66 mg/dL — ABNORMAL HIGH (ref 0.61–1.24)
GFR, Estimated: 41 mL/min — ABNORMAL LOW (ref >60)
Glucose, Bld: 114 mg/dL — ABNORMAL HIGH (ref 70–99)
Potassium: 4.1 mmol/L (ref 3.5–5.1)
Sodium: 138 mmol/L (ref 135–145)

## 2023-04-16 LAB — URINALYSIS, ROUTINE W REFLEX MICROSCOPIC
Bilirubin Urine: NEGATIVE
Glucose, UA: NEGATIVE mg/dL
Hgb urine dipstick: NEGATIVE
Ketones, ur: NEGATIVE mg/dL
Leukocytes,Ua: NEGATIVE
Nitrite: NEGATIVE
Protein, ur: NEGATIVE mg/dL
Specific Gravity, Urine: 1.033 — ABNORMAL HIGH (ref 1.005–1.030)
pH: 5 (ref 5.0–8.0)

## 2023-04-16 LAB — CBG MONITORING, ED: Glucose-Capillary: 92 mg/dL (ref 70–99)

## 2023-04-16 LAB — MAGNESIUM: Magnesium: 1.8 mg/dL (ref 1.7–2.4)

## 2023-04-16 LAB — CBC
HCT: 34.5 % — ABNORMAL LOW (ref 39.0–52.0)
Hemoglobin: 10.8 g/dL — ABNORMAL LOW (ref 13.0–17.0)
MCH: 29.4 pg (ref 26.0–34.0)
MCHC: 31.3 g/dL (ref 30.0–36.0)
MCV: 94 fL (ref 80.0–100.0)
Platelets: 145 10*3/uL — ABNORMAL LOW (ref 150–400)
RBC: 3.67 MIL/uL — ABNORMAL LOW (ref 4.22–5.81)
RDW: 14.1 % (ref 11.5–15.5)
WBC: 4.7 10*3/uL (ref 4.0–10.5)
nRBC: 0 % (ref 0.0–0.2)

## 2023-04-16 LAB — HEMOGLOBIN A1C
Hgb A1c MFr Bld: 6.2 % — ABNORMAL HIGH (ref 4.8–5.6)
Mean Plasma Glucose: 131.24 mg/dL

## 2023-04-16 MED ORDER — BRIMONIDINE TARTRATE 0.2 % OP SOLN
1.0000 [drp] | Freq: Two times a day (BID) | OPHTHALMIC | Status: DC
  Administered 2023-04-17 – 2023-04-18 (×4): 1 [drp] via OPHTHALMIC
  Filled 2023-04-16: qty 5

## 2023-04-16 MED ORDER — CLOPIDOGREL BISULFATE 75 MG PO TABS
75.0000 mg | ORAL_TABLET | Freq: Every day | ORAL | Status: DC
  Administered 2023-04-16 – 2023-04-18 (×3): 75 mg via ORAL
  Filled 2023-04-16 (×3): qty 1

## 2023-04-16 MED ORDER — ONDANSETRON HCL 4 MG/2ML IJ SOLN: 4.0000 mg | Freq: Four times a day (QID) | INTRAMUSCULAR | Status: DC | PRN

## 2023-04-16 MED ORDER — DORZOLAMIDE HCL-TIMOLOL MAL 2-0.5 % OP SOLN
1.0000 [drp] | Freq: Two times a day (BID) | OPHTHALMIC | Status: DC
  Administered 2023-04-17 – 2023-04-18 (×4): 1 [drp] via OPHTHALMIC
  Filled 2023-04-16: qty 10

## 2023-04-16 MED ORDER — MELATONIN 3 MG PO TABS
3.0000 mg | ORAL_TABLET | Freq: Every evening | ORAL | Status: DC | PRN
Start: 2023-04-16 — End: 2023-04-18

## 2023-04-16 MED ORDER — ASPIRIN 81 MG PO TBEC
81.0000 mg | DELAYED_RELEASE_TABLET | Freq: Every day | ORAL | Status: DC
Start: 2023-04-16 — End: 2023-04-18
  Administered 2023-04-16 – 2023-04-18 (×3): 81 mg via ORAL
  Filled 2023-04-16 (×3): qty 1

## 2023-04-16 MED ORDER — ATORVASTATIN CALCIUM 40 MG PO TABS
40.0000 mg | ORAL_TABLET | Freq: Every day | ORAL | Status: DC
  Administered 2023-04-17 – 2023-04-18 (×2): 40 mg via ORAL
  Filled 2023-04-16 (×2): qty 1

## 2023-04-16 MED ORDER — ACETAMINOPHEN 325 MG PO TABS
650.0000 mg | ORAL_TABLET | Freq: Four times a day (QID) | ORAL | Status: DC | PRN
Start: 2023-04-16 — End: 2023-04-17

## 2023-04-16 MED ORDER — STROKE: EARLY STAGES OF RECOVERY BOOK: Freq: Once | Status: DC

## 2023-04-16 MED ORDER — IOHEXOL 350 MG/ML SOLN
75.0000 mL | Freq: Once | INTRAVENOUS | Status: AC | PRN
  Administered 2023-04-16: 75 mL via INTRAVENOUS

## 2023-04-16 MED ORDER — LATANOPROST 0.005 % OP SOLN
1.0000 [drp] | Freq: Every day | OPHTHALMIC | Status: DC
  Administered 2023-04-17 (×2): 1 [drp] via OPHTHALMIC
  Filled 2023-04-16: qty 2.5

## 2023-04-16 MED ORDER — NICOTINE 14 MG/24HR TD PT24: 14.0000 mg | MEDICATED_PATCH | Freq: Every day | TRANSDERMAL | Status: DC | PRN

## 2023-04-16 MED ORDER — INSULIN ASPART 100 UNIT/ML IJ SOLN: 0.0000 [IU] | Freq: Three times a day (TID) | INTRAMUSCULAR | Status: DC

## 2023-04-16 MED ORDER — HYDRALAZINE HCL 20 MG/ML IJ SOLN: 10.0000 mg | INTRAMUSCULAR | Status: DC | PRN

## 2023-04-16 MED ORDER — ACETAMINOPHEN 650 MG RE SUPP: 650.0000 mg | Freq: Four times a day (QID) | RECTAL | Status: DC | PRN

## 2023-04-16 NOTE — ED Notes (Signed)
ED TO INPATIENT HANDOFF REPORT  ED Nurse Name and Phone #:   Molli Hazard [phone]  S Name/Age/Gender Cristian Hayden 82 y.o. male Room/Bed: 025C/025C  Code Status   Code Status: Full Code  Home/SNF/Other Home Patient oriented to: self, place, time, and situation Is this baseline? Yes   Triage Complete: Triage complete  Chief Complaint Vertigo [R42]  Triage Note Pt states he has felt dizzy when he moves this morning. He currently denies dizziness, but has dizziness when he stands up or moves around. Pt states he woke up with dizziness this am. Pt has also had constipation. Pt denies nausea, vomting, diarrhea or pain anywhere.   Allergies No Known Allergies  Level of Care/Admitting Diagnosis ED Disposition     ED Disposition  Admit   Condition  --   Comment  Hospital Area: [facility] [[id number]]  Level of Care: Telemetry Medical [104]  May place patient in observation at [facility] or [facility] if equivalent level of care is available:: No  Covid Evaluation: Asymptomatic - no recent exposure (last 10 days) testing not required  Diagnosis: Vertigo [207257]  Admitting Physician: Angie Fava [[id number]]  Attending Physician: Angie Fava [[id number]]          B Medical/Surgery History Past Medical History:  Diagnosis Date   Arthritis    Bronchitis    CAD (coronary artery disease)    Diabetes mellitus without complication (HCC)    Hyperlipidemia    Hypertension    Peripheral vascular disease (HCC)    with claludication   Past Surgical History:  Procedure Laterality Date   CATARACT EXTRACTION  June 2013   Cataract Right eye   CORONARY ARTERY BYPASS GRAFT     LIMA to LAD, SVG to PDA, SVG to OM1/OM2   Femoral-Femoral BPGA  05/29/1999   Left profundoplasty  and Left Fem/Pop 05/28/2008     A IV Location/Drains/Wounds Patient Lines/Drains/Airways Status     Active Line/Drains/Airways     Name Placement date Placement time Site Days    Peripheral IV 04/16/23 18 G Right Antecubital 04/16/23  0856  Antecubital  less than 1            Intake/Output Last 24 hours No intake or output data in the 24 hours ending 04/16/23 2340  Labs/Imaging Results for orders placed or performed during the hospital encounter of 04/16/23 (from the past 48 hours)  Basic metabolic panel     Status: Abnormal   Collection Time: 04/16/23  8:35 AM  Result Value Ref Range   Sodium 138 135 - 145 mmol/L   Potassium 4.1 3.5 - 5.1 mmol/L   Chloride 109 98 - 111 mmol/L   CO2 23 22 - 32 mmol/L   Glucose, Bld 114 (H) 70 - 99 mg/dL    Comment: Glucose reference range applies only to samples taken after fasting for at least 8 hours.   BUN 15 8 - 23 mg/dL   Creatinine, Ser 9.56 (H) 0.61 - 1.24 mg/dL   Calcium 8.8 (L) 8.9 - 10.3 mg/dL   GFR, Estimated 41 (L) >60 mL/min    Comment: (NOTE) Calculated using the CKD-EPI Creatinine Equation (2021)    Anion gap 6 5 - 15    Comment: Performed at [facility], [address], [place], [place] [address]  CBC     Status: Abnormal   Collection Time: 04/16/23  8:35 AM  Result Value Ref Range   WBC 4.7 4.0 - 10.5  K/uL   RBC 3.67 (L) 4.22 - 5.81 MIL/uL   Hemoglobin 10.8 (L) 13.0 - 17.0 g/dL   HCT 30.8 (L) 65.7 - 84.6 %   MCV 94.0 80.0 - 100.0 fL   MCH 29.4 26.0 - 34.0 pg   MCHC 31.3 30.0 - 36.0 g/dL   RDW 96.2 95.2 - 84.1 %   Platelets 145 (L) 150 - 400 K/uL   nRBC 0.0 0.0 - 0.2 %    Comment: Performed at [facility], [address], [place], [place] [address]  Hemoglobin A1c     Status: Abnormal   Collection Time: 04/16/23  8:35 AM  Result Value Ref Range   Hgb A1c MFr Bld 6.2 (H) 4.8 - 5.6 %    Comment: (NOTE) Pre diabetes:          5.7%-6.4%  Diabetes:              >6.4%  Glycemic control for   <7.0% adults with diabetes    Mean Plasma Glucose 131.24 mg/dL    Comment: Performed at [facility], [address] [place], [place] [address]  CBG monitoring, ED     Status:  None   Collection Time: 04/16/23  9:03 AM  Result Value Ref Range   Glucose-Capillary 92 70 - 99 mg/dL    Comment: Glucose reference range applies only to samples taken after fasting for at least 8 hours.  Urinalysis, Routine w reflex microscopic -Urine, Clean Catch     Status: Abnormal   Collection Time: 04/16/23  4:14 PM  Result Value Ref Range   Color, Urine YELLOW YELLOW   APPearance CLEAR CLEAR   Specific Gravity, Urine 1.033 (H) 1.005 - 1.030   pH 5.0 5.0 - 8.0   Glucose, UA NEGATIVE NEGATIVE mg/dL   Hgb urine dipstick NEGATIVE NEGATIVE   Bilirubin Urine NEGATIVE NEGATIVE   Ketones, ur NEGATIVE NEGATIVE mg/dL   Protein, ur NEGATIVE NEGATIVE mg/dL   Nitrite NEGATIVE NEGATIVE   Leukocytes,Ua NEGATIVE NEGATIVE    Comment: Performed at [facility], [address] [place], [place] [address]   CT ANGIO HEAD NECK W WO CM Result Date: 04/16/2023 CLINICAL DATA:  Syncope/presyncope, cerebrovascular cause suspected EXAM: CT ANGIOGRAPHY HEAD AND NECK WITH AND WITHOUT CONTRAST TECHNIQUE: Multidetector CT imaging of the head and neck was performed using the standard protocol during bolus administration of intravenous contrast. Multiplanar CT image reconstructions and MIPs were obtained to evaluate the vascular anatomy. Carotid stenosis measurements (when applicable) are obtained utilizing NASCET criteria, using the distal internal carotid diameter as the denominator. RADIATION DOSE REDUCTION: This exam was performed according to the departmental dose-optimization program which includes automated exposure control, adjustment of the mA and/or kV according to patient size and/or use of iterative reconstruction technique. CONTRAST:  75mL OMNIPAQUE IOHEXOL 350 MG/ML SOLN COMPARISON:  CT head 09/06/22 FINDINGS: CT HEAD FINDINGS Brain: No hemorrhage. No hydrocephalus. No extra-axial fluid collection. No CT evidence of an acute cortical infarct. No mass effect. No mass lesion. There is a background of  moderate chronic microvascular ischemic change. Vascular: See below Skull: Normal. Negative for fracture or focal lesion. Sinuses/Orbits: No middle ear or mastoid effusion. Paranasal sinuses are clear. Bilateral lens replacement. Orbits are otherwise unremarkable. Other: None. Review of the MIP images confirms the above findings CTA NECK FINDINGS Aortic arch: Standard branching. Imaged portion shows no evidence of aneurysm or dissection. No significant stenosis of the major arch vessel origins. Right carotid system: No  evidence of dissection. 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 Left carotid system: No evidence of dissection, stenosis (50% or greater), or occlusion. Vertebral arteries: Codominant. No evidence of dissection, stenosis (50% or greater), or occlusion. Skeleton: Negative. Other neck: Negative. Upper chest: Moderate centrilobular emphysema Review of the MIP images confirms the above findings CTA HEAD FINDINGS Anterior circulation: No significant stenosis, proximal occlusion, aneurysm, or vascular malformation. There is atherosclerotic calcification of the carotid siphons bilaterally Posterior circulation: No significant stenosis, proximal occlusion, aneurysm, or vascular malformation. Venous sinuses: As permitted by contrast timing, patent. Anatomic variants: None Review of the MIP images confirms the above findings IMPRESSION: 1. No acute intracranial abnormality. 2. No intracranial large vessel occlusion or significant stenosis. 3. 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 4. 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 Aortic Atherosclerosis (ICD10-I70.0) and Emphysema (ICD10-J43.9). Electronically Signed   By: Lorenza Cambridge M.D.   On: 04/16/2023 11:37    Pending Labs Unresulted Labs (From admission, onward)     Start     Ordered   04/17/23 0500  CBC with Differential/Platelet   Tomorrow morning,   R        04/16/23 2143   04/17/23 0500  Comprehensive metabolic panel  Tomorrow morning,   R        04/16/23 2143   04/17/23 0500  Magnesium  Tomorrow morning,   R        04/16/23 2143   04/17/23 0500  Lipid panel  (Labs)  Tomorrow morning,   R        04/16/23 2146   04/16/23 2347  Protime-INR  Once,   AD        04/16/23 2347   04/16/23 2347  APTT  Once,   AD        04/16/23 2347   04/16/23 2143  Magnesium  Add-on,   AD        04/16/23 2143            Vitals/Pain Today's Vitals   04/16/23 2030 04/16/23 2045 04/16/23 2100 04/16/23 2131  BP:      Pulse: 65 63 69 (!) 56  Resp: 19 20 20 11   Temp:      TempSrc:      SpO2: 100% 100% 100% 100%  Weight:      Height:      PainSc:        Isolation Precautions No active isolations  Medications Medications  acetaminophen (TYLENOL) tablet 650 mg (has no administration in time range)    Or  acetaminophen (TYLENOL) suppository 650 mg (has no administration in time range)  melatonin tablet 3 mg (has no administration in time range)  ondansetron (ZOFRAN) injection 4 mg (has no administration in time range)  hydrALAZINE (APRESOLINE) injection 10 mg (has no administration in time range)   stroke: early stages of recovery book (has no administration in time range)  aspirin EC tablet 81 mg (81 mg Oral Given 04/16/23 2224)  clopidogrel (PLAVIX) tablet 75 mg (75 mg Oral Given 04/16/23 2224)  nicotine (NICODERM CQ - dosed in mg/24 hours) patch 14 mg (has no administration in time range)  atorvastatin (LIPITOR) tablet 40 mg (has no administration in time range)  brimonidine (ALPHAGAN) 0.2 % ophthalmic solution 1 drop (has no administration in time range)  dorzolamide-timolol (COSOPT) 2-0.5 % ophthalmic solution 1 drop (has no administration in  time range)  latanoprost (XALATAN) 0.005 % ophthalmic solution 1 drop (has no administration in time range)  insulin aspart (novoLOG) injection 0-9 Units (has no administration  in time range)  iohexol (OMNIPAQUE) 350 MG/ML injection 75 mL (75 mLs Intravenous Contrast Given 04/16/23 1045)    Mobility walks     Focused Assessments Cardiac Assessment Handoff:  Cardiac Rhythm: Normal sinus rhythm No results found for: "CKTOTAL", "CKMB", "CKMBINDEX", "TROPONINI" No results found for: "DDIMER" Does the Patient currently have chest pain? No   , Neuro Assessment Handoff:  Swallow screen pass? Yes  Cardiac Rhythm: Normal sinus rhythm NIH Stroke Scale  Dizziness Present: Yes Headache Present: No Level of Consciousness (1a.)   : Alert, keenly responsive LOC Questions (1b. )   : Answers both questions correctly LOC Commands (1c. )   : Performs both tasks correctly Best Gaze (2. )  : Normal Visual (3. )  : No visual loss Facial Palsy (4. )    : Normal symmetrical movements Motor Arm, Left (5a. )   : No drift Motor Arm, Right (5b. ) : No drift Motor Leg, Left (6a. )  : No drift Motor Leg, Right (6b. ) : No drift Limb Ataxia (7. ): Absent Sensory (8. )  : Normal, no sensory loss Best Language (9. )  : No aphasia Dysarthria (10. ): Normal Extinction/Inattention (11.)   : No Abnormality Complete NIHSS TOTAL: 0     Neuro Assessment: Within Defined Limits Neuro Checks:      Has TPA been given?  If patient is a Neuro Trauma and patient is going to OR before floor call report to 4N Charge nurse: [phone] or [phone]  , Renal Assessment Handoff:  Hemodialysis Schedule:  Last Hemodialysis date and time:    Restricted appendage:   , Pulmonary Assessment Handoff:  Lung sounds:   O2 Device: Room Air      R Recommendations: See Admitting Provider Note  Report given to:   Additional Notes:   Pt is cooperative and kind. Has been ambulatory and able to walk to restroom. Pt reports mild dizziness, but reports he is feeling "okay". Pt denies CP/SOB or N/V/D,

## 2023-04-16 NOTE — ED Notes (Signed)
PT REPORTS DIZZINESS DURING AMBULATION. PT ALSO REPORTS PAIN TO LEFT FLANK TO GROIN DURING URINATION.

## 2023-04-16 NOTE — ED Notes (Signed)
MRI DELAYED DUE TO PT HAVING A BB IN RIGHT EYE FROM CHILDHOOD. WAITING ON FURTHER INFORMATION FROM MRI/EDP

## 2023-04-16 NOTE — ED Provider Triage Note (Signed)
Emergency Medicine Provider Triage Evaluation Note  Cristian Hayden , a 82 y.o. male  was evaluated in triage.  Pt complains of dizziness.  Provoked by head movement.  Patient states when he turns his head to the left he feels dizzy like the room is spinning.  He states his symptoms stopped when he returned sat to the normal position.  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  Patient states he has been diagnosed with this in the past only once previously and it improved with medications.  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  Patient denies any other neurovascular deficits including no changes in speech, no facial asymmetry.  No confusion, no difficulty or changes in ambulation, no sensation or motor deficits.  Review of Systems  Positive: Dizziness Negative: Fevers, chills, cough, URI symptoms, nausea, vomiting, black or bloody stools.  Recent falls or head trauma.  Physical Exam  BP (!) 182/77 (BP Location: Right Arm)   Pulse (!) 55   Temp 97.6 F (36.4 C) (Oral)   Resp 16   Ht 5\' 4"  (1.626 m)   Wt 56.7 kg   SpO2 100%   BMI 21.46 kg/m  Gen:   Awake, no distress   Resp:  Normal effort  MSK:   Moves extremities without difficulty  Neuro:  No facial asymmetry, left eye vision intact, no sensation or motor dysfunction.  Medical Decision Making  Medically screening exam initiated at 8:47 AM.  Appropriate orders placed.  [name] was informed that the remainder of the evaluation will be completed by another provider, this initial triage assessment does not replace that evaluation, and the importance of remaining in the ED until their evaluation is complete.  Symptoms thought to be likely secondary to benign positional vertigo versus coronary artery stenosis.  No stroke activation at this time.  CTA head and neck ordered.  Patient  instructed to stop turning his head to the side until clear etiology found.  Recommended for next room available.   Edwin Dada P, DO 04/16/23 [id number]
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
## 2023-04-16 NOTE — ED Triage Notes (Signed)
Pt states he has felt dizzy when he moves this morning. He currently denies dizziness, but has dizziness when he stands up or moves around. Pt states he woke up with dizziness this am. Pt has also had constipation. Pt denies nausea, vomting, diarrhea or pain anywhere.

## 2023-04-16 NOTE — ED Provider Notes (Signed)
Signout from Dr. Lynelle Doctor.  82 year old male here with dizziness and imbalance possible visual changes.  CT angio does not show any definite acute findings.  Apparently unable to get an MRI due to retained foreign body. Physical Exam  BP (!) 174/68   Pulse 87   Temp 97.6 F (36.4 C)   Resp 17   Ht 5\' 4"  (1.626 m)   Wt 56.7 kg   SpO2 96%   BMI 21.46 kg/m   Physical Exam  Procedures  Procedures  ED Course / MDM   Clinical Course as of 04/16/23 1538  Fri Apr 16, 2023  1246 CBC(!) CBC shows anemia, similar to previous values. [JK]  1246 Basic metabolic panel(!) Metabolic panel shows increased creatinine decreased compared to previous values [JK]  1247 Head CT does not show any acute abnormality.  Patient does have 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 of the right ICA and occlusion of the right external carotid artery [JK]  1322 Concerned about possible central cause for his dizziness.  Will proceed with MRI [JK]    Clinical Course User Index [JK] Linwood Dibbles, MD   Medical Decision Making Amount and/or Complexity of Data Reviewed Labs: ordered. Decision-making details documented in ED Course.   1706.  Discussed with Dr. Selina Cooley neurology who said she would consult on the patient for further recommendations.  2100.  Patient seen by Dr. Derry Lory neurology who feels he has signs of central vertigo and should have a medical admission for further evaluation. 2130.  Discussed with [facility] hospitalist Dr. Arlean Hopping who will evaluate patient for admission.    Terrilee Files, MD 04/17/23 [phone]

## 2023-04-16 NOTE — ED Notes (Signed)
Pt is presently sleeping.
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
## 2023-04-17 ENCOUNTER — Observation Stay (HOSPITAL_COMMUNITY): Payer: Medicare Other

## 2023-04-17 DIAGNOSIS — I10 Essential (primary) hypertension: Secondary | ICD-10-CM | POA: Diagnosis not present

## 2023-04-17 DIAGNOSIS — I639 Cerebral infarction, unspecified: Secondary | ICD-10-CM | POA: Diagnosis not present

## 2023-04-17 LAB — COMPREHENSIVE METABOLIC PANEL
ALT: 10 U/L (ref 0–44)
AST: 16 U/L (ref 15–41)
Albumin: 3.5 g/dL (ref 3.5–5.0)
Alkaline Phosphatase: 83 U/L (ref 38–126)
Anion gap: 9 (ref 5–15)
BUN: 14 mg/dL (ref 8–23)
CO2: 22 mmol/L (ref 22–32)
Calcium: 9.1 mg/dL (ref 8.9–10.3)
Chloride: 107 mmol/L (ref 98–111)
Creatinine, Ser: 1.49 mg/dL — ABNORMAL HIGH (ref 0.61–1.24)
GFR, Estimated: 47 mL/min — ABNORMAL LOW (ref >60)
Glucose, Bld: 104 mg/dL — ABNORMAL HIGH (ref 70–99)
Potassium: 4.2 mmol/L (ref 3.5–5.1)
Sodium: 138 mmol/L (ref 135–145)
Total Bilirubin: 0.6 mg/dL (ref <1.2)
Total Protein: 6.8 g/dL (ref 6.5–8.1)

## 2023-04-17 LAB — GLUCOSE, CAPILLARY
Glucose-Capillary: 102 mg/dL — ABNORMAL HIGH (ref 70–99)
Glucose-Capillary: 132 mg/dL — ABNORMAL HIGH (ref 70–99)
Glucose-Capillary: 81 mg/dL (ref 70–99)
Glucose-Capillary: 92 mg/dL (ref 70–99)
Glucose-Capillary: 92 mg/dL (ref 70–99)

## 2023-04-17 LAB — LIPID PANEL
Cholesterol: 126 mg/dL (ref 0–200)
HDL: 50 mg/dL (ref >40)
LDL Cholesterol: 64 mg/dL (ref 0–99)
Total CHOL/HDL Ratio: 2.5 {ratio}
Triglycerides: 60 mg/dL (ref <150)
VLDL: 12 mg/dL (ref 0–40)

## 2023-04-17 LAB — CBC WITH DIFFERENTIAL/PLATELET
Abs Immature Granulocytes: 0.01 10*3/uL (ref 0.00–0.07)
Basophils Absolute: 0 10*3/uL (ref 0.0–0.1)
Basophils Relative: 0 %
Eosinophils Absolute: 0.1 10*3/uL (ref 0.0–0.5)
Eosinophils Relative: 2 %
HCT: 35.8 % — ABNORMAL LOW (ref 39.0–52.0)
Hemoglobin: 11.5 g/dL — ABNORMAL LOW (ref 13.0–17.0)
Immature Granulocytes: 0 %
Lymphocytes Relative: 35 %
Lymphs Abs: 1.5 10*3/uL (ref 0.7–4.0)
MCH: 29.4 pg (ref 26.0–34.0)
MCHC: 32.1 g/dL (ref 30.0–36.0)
MCV: 91.6 fL (ref 80.0–100.0)
Monocytes Absolute: 0.3 10*3/uL (ref 0.1–1.0)
Monocytes Relative: 7 %
Neutro Abs: 2.4 10*3/uL (ref 1.7–7.7)
Neutrophils Relative %: 56 %
Platelets: 151 10*3/uL (ref 150–400)
RBC: 3.91 MIL/uL — ABNORMAL LOW (ref 4.22–5.81)
RDW: 14 % (ref 11.5–15.5)
WBC: 4.3 10*3/uL (ref 4.0–10.5)
nRBC: 0 % (ref 0.0–0.2)

## 2023-04-17 LAB — ECHOCARDIOGRAM COMPLETE
AR max vel: 1.83 cm2
AV Area VTI: 1.56 cm2
AV Area mean vel: 1.58 cm2
AV Mean grad: 6.3 mm[Hg]
AV Peak grad: 11.7 mm[Hg]
Ao pk vel: 1.71 m/s
Area-P 1/2: 4.44 cm2
Height: 64 in
S' Lateral: 3 cm
Weight: 1841.28 [oz_av]

## 2023-04-17 LAB — PROTIME-INR
INR: 1.1 (ref 0.8–1.2)
Prothrombin Time: 14.2 s (ref 11.4–15.2)

## 2023-04-17 LAB — APTT: aPTT: 32 s (ref 24–36)

## 2023-04-17 LAB — MAGNESIUM: Magnesium: 1.9 mg/dL (ref 1.7–2.4)

## 2023-04-17 MED ORDER — ISOSORB DINITRATE-HYDRALAZINE 20-37.5 MG PO TABS
1.0000 | ORAL_TABLET | Freq: Three times a day (TID) | ORAL | Status: DC
  Administered 2023-04-18: 1 via ORAL
  Filled 2023-04-17 (×2): qty 1

## 2023-04-17 MED ORDER — CARVEDILOL 3.125 MG PO TABS
3.1250 mg | ORAL_TABLET | Freq: Two times a day (BID) | ORAL | Status: DC
  Filled 2023-04-17: qty 1

## 2023-04-17 MED ORDER — AMLODIPINE BESYLATE 5 MG PO TABS
10.0000 mg | ORAL_TABLET | Freq: Every day | ORAL | Status: DC
  Administered 2023-04-17 – 2023-04-18 (×2): 10 mg via ORAL
  Filled 2023-04-17 (×2): qty 2

## 2023-04-17 MED ORDER — ACETAMINOPHEN 325 MG PO TABS: 650.0000 mg | ORAL_TABLET | Freq: Four times a day (QID) | ORAL | Status: DC | PRN

## 2023-04-17 NOTE — Evaluation (Signed)
Occupational Therapy Evaluation Patient Details Name: Cristian Hayden MRN: [id number] DOB: 22-Aug-1940 Today's Date: 04/17/2023   History of Present Illness 82 y/o M presenting to ED on 12/20 with dizziness and vision changes, MRI with no acute abnormalitty. Admitted for further mgmt of suspected acute ishemic stroke in stting of presenting central vertigo    PMH includes CAD, PVD, retinal artery occlusion of R eye, hypercholesteremia, HTN, DM   Clinical Impression   Pt reports ind at baseline with ADL/functional mobility, lives with spouse who can assist at d/c. Pt currently needing set up - min A for ADLs, supervision for bed mobility and CGA for transfers with RW. Pt reports mild dizziness from sit > stand, that disappears after a few seconds. Educated pt on gaze stabilization for when he has incr dizziness upon standing. Pt presenting with impairments listed below, will follow acutely. Recommend HHOT at d/c.       If plan is discharge home, recommend the following: A little help with walking and/or transfers;A little help with bathing/dressing/bathroom;Assistance with cooking/housework;Direct supervision/assist for medications management;Direct supervision/assist for financial management;Help with stairs or ramp for entrance;Assist for transportation    Functional Status Assessment  Patient has had a recent decline in their functional status and demonstrates the ability to make significant improvements in function in a reasonable and predictable amount of time.  Equipment Recommendations  Tub/shower seat    Recommendations for Other Services PT consult     Precautions / Restrictions Precautions Precautions: Fall Restrictions Weight Bearing Restrictions Per Provider Order: No      Mobility Bed Mobility Overal bed mobility: Needs Assistance Bed Mobility: Supine to Sit, Sit to Supine     Supine to sit: Supervision Sit to supine: Supervision        Transfers Overall  transfer level: Needs assistance Equipment used: Rolling walker (2 wheels) Transfers: Sit to/from Stand Sit to Stand: Contact guard assist                  Balance Overall balance assessment: Needs assistance Sitting-balance support: Feet supported Sitting balance-Leahy Scale: Good     Standing balance support: During functional activity, Reliant on assistive device for balance Standing balance-Leahy Scale: Poor                             ADL either performed or assessed with clinical judgement   ADL Overall ADL's : Needs assistance/impaired Eating/Feeding: Set up   Grooming: Set up;Standing   Upper Body Bathing: Minimal assistance;Sitting   Lower Body Bathing: Minimal assistance;Sitting/lateral leans   Upper Body Dressing : Minimal assistance;Sitting   Lower Body Dressing: Minimal assistance;Sitting/lateral leans   Toilet Transfer: Contact guard assist;Ambulation;Rolling walker (2 wheels)   Toileting- Clothing Manipulation and Hygiene: Contact guard assist       Functional mobility during ADLs: Contact guard assist;Rolling walker (2 wheels)       Vision   Additional Comments: R eye blind at baseline     Perception Perception: Not tested       Praxis Praxis: Not tested       Pertinent Vitals/Pain Pain Assessment Pain Assessment: No/denies pain     Extremity/Trunk Assessment Upper Extremity Assessment Upper Extremity Assessment: Generalized weakness   Lower Extremity Assessment Lower Extremity Assessment: Defer to PT evaluation   Cervical / Trunk Assessment Cervical / Trunk Assessment: Normal   Communication Communication Communication: No apparent difficulties   Cognition Arousal: Alert Behavior During Therapy: Flat affect  Overall Cognitive Status: Within Functional Limits for tasks assessed                                 General Comments: pt with short responses, spouse answering questions for pt, pt with  incr time for command follow     General Comments  VSS on RA    Exercises     Shoulder Instructions      Home Living Family/patient expects to be discharged to:: Private residence Living Arrangements: Spouse/significant other Available Help at Discharge: Family;Available 24 hours/day Type of Home: Apartment Home Access: Stairs to enter [employer] of Steps: 6 Entrance Stairs-Rails: Can reach both Home Layout: One level     Bathroom Shower/Tub: Tub/shower unit         Home Equipment: Cane - single point          Prior Functioning/Environment Prior Level of Function : Needs assist             Mobility Comments: uses cane out in community PRN, 3 LOB instances, but no falls ADLs Comments: ind        OT Problem List: Decreased range of motion;Decreased strength;Decreased activity tolerance;Impaired balance (sitting and/or standing);Decreased cognition;Impaired vision/perception      OT Treatment/Interventions: Self-care/ADL training;Therapeutic exercise;Energy conservation;DME and/or AE instruction;Therapeutic activities;Balance training;Patient/family education    OT Goals(Current goals can be found in the care plan section) Acute Rehab OT Goals Patient Stated Goal: none stated OT Goal Formulation: With patient Time For Goal Achievement: 05/01/23 Potential to Achieve Goals: Good ADL Goals Pt Will Perform Upper Body Dressing: with modified independence;sitting Pt Will Perform Lower Body Dressing: with modified independence;sitting/lateral leans;sit to/from stand Pt Will Perform Toileting - Clothing Manipulation and hygiene: with modified independence;sitting/lateral leans Pt Will Perform Tub/Shower Transfer: Shower transfer;Tub transfer;with modified independence;ambulating  OT Frequency: Min 1X/week    Co-evaluation              AM-PAC OT "6 Clicks" Daily Activity     Outcome Measure Help from another person eating meals?: None Help from  another person taking care of personal grooming?: A Little Help from another person toileting, which includes using toliet, bedpan, or urinal?: A Little Help from another person bathing (including washing, rinsing, drying)?: A Little Help from another person to put on and taking off regular upper body clothing?: A Little Help from another person to put on and taking off regular lower body clothing?: A Little 6 Click Score: 19   End of Session Equipment Utilized During Treatment: Gait belt;Rolling walker (2 wheels) Nurse Communication: Mobility status  Activity Tolerance: Patient tolerated treatment well Patient left: in bed;with call bell/phone within reach;with bed alarm set;with family/visitor present  OT Visit Diagnosis: Unsteadiness on feet (R26.81);Other abnormalities of gait and mobility (R26.89);Muscle weakness (generalized) (M62.81)                Time: 4098-1191 OT Time Calculation (min): 20 min Charges:  OT General Charges $OT Visit: 1 Visit OT Evaluation $OT Eval Low Complexity: 1 Low  Terriann Difonzo K, OTD, OTR/L [employer] [facility]erred Acute Rehab [phone]   Carver Fila Koonce 04/17/2023, 12:06 PM
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
## 2023-04-17 NOTE — Care Management Obs Status (Signed)
MEDICARE OBSERVATION STATUS NOTIFICATION   Patient Details  Name: Cristian Hayden MRN: [id number] Date of Birth: [birth year]   Medicare Observation Status Notification Given:  Yes    Lawerance Sabal, RN 04/17/2023, 9:56 AM

## 2023-04-17 NOTE — Progress Notes (Signed)
Cristian Hayden  OAC:[id number] DOB: [birth year] DOA: 04/16/2023 PCP: Norm Salt, PA    Brief Narrative:  82 year old with a history of DM2, HTN, HLD, CKD stage IIIb with baseline creatinine 1.6-2.2, anemia of chronic kidney disease with baseline hemoglobin approximately 11, chronic right eye blindness with glaucoma, and tobacco abuse who presented to the ER 12/20 with complaints of dizziness first noted upon waking that morning and accompanied by unsteadiness on his feet.  CT head and cervical spine in the ER revealed no evidence of an acute intracranial process.  On neurologic evaluation the patient was noted to have horizontal nystagmus suggesting central vertigo consistent with an acute ischemic CVA.  Goals of Care:   Code Status: Full Code   DVT prophylaxis: SCDs Start: 04/16/23 2143   Interim Hx: Afebrile since admission.  Blood pressure elevated with range 167-199.  Some sinus bradycardia with heart rates dipping as low as 48.  CBG controlled.  Was noted to be significantly orthostatic when working with OT today with SBP dropping from 193 to 112 when standing.  The patient however denied dizziness at that time.  He is ambulating in his room at the time of visit, using his walker to get to the bathroom with assistance.  He denies chest pain shortness of breath fevers or chills.  Further history given to staff during this admission suggest this patient's intermittent dizziness has actually been occurring for months.  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 -TTE EF 60-65% with normal LV function with mild aortic valve stenosis -medical treatment: On no platelet  medications prior to admission -to begin aspirin and Plavix for 21 days followed by aspirin alone -LDL 64 on statin -A1c 6.2 -PT/OT/SLP recommend outpatient therapy  DM2 CBG well-controlled at present and also chronically as evidenced by A1c of 6.2  HTN Blood pressure elevated with range of 167-210 during this admission -adjust medical therapy carefully in setting of significant orthostasis  HLD Continue statin with LDL at goal  CKD stage IIIb Renal function stable  Anemia of chronic kidney disease Hemoglobin stable  Chronic longstanding tobacco abuse Has been counseled on the absolute need to discontinue tobacco abuse  Family Communication: No family present at time of exam Disposition: Anticipate discharge home in next 24 hours with outpatient PT/OT   Objective: Blood pressure (!) 199/81, pulse (!) 58, temperature 98 F (36.7 C), resp. rate 18, height 5\' 4"  (1.626 m), weight 52.2 kg, SpO2 100%.  Intake/Output Summary (Last 24 hours) at 04/17/2023 0957 Last data filed at 04/17/2023 0127 Gross per 24 hour  Intake --  Output 300 ml  Net -300 ml   Filed Weights   04/16/23 0822 04/17/23 0025 04/17/23 0420  Weight: 56.7 kg 52.4 kg 52.2 kg    Examination: General: No acute respiratory distress Lungs: No obvious wheezing Cardiovascular: Regular rate and rhythm  Abdomen: Benign Extremities: No significant edema bilateral lower extremities  CBC: Recent Labs  Lab 04/16/23 0835 04/17/23 0519  WBC 4.7 4.3  NEUTROABS  --  2.4  HGB 10.8* 11.5*  HCT 34.5* 35.8*  MCV 94.0 91.6  PLT 145* 151   Basic Metabolic Panel: Recent Labs  Lab 04/16/23 0835  04/17/23 0519  NA 138 138  K 4.1 4.2  CL 109 107  CO2 23 22  GLUCOSE 114* 104*  BUN 15 14  CREATININE 1.66* 1.49*  CALCIUM 8.8* 9.1  MG 1.8 1.9   GFR: Estimated Creatinine Clearance: 28.2 mL/min (A) (by C-G formula based on SCr of 1.49 mg/dL (H)).   Scheduled Meds:   stroke: early stages of recovery book    Does not apply Once   aspirin EC  81 mg Oral Daily   atorvastatin  40 mg Oral Daily   brimonidine  1 drop Both Eyes BID   clopidogrel  75 mg Oral Daily   dorzolamide-timolol  1 drop Both Eyes BID   insulin aspart  0-9 Units Subcutaneous TID WC   latanoprost  1 drop Both Eyes QHS     LOS: 0 days   Lonia Blood, MD [facility] Office  [phone] Pager - Text Page per Loretha Stapler  If 7PM-7AM, please contact night-coverage per Amion 04/17/2023, 9:57 AM

## 2023-04-17 NOTE — Plan of Care (Signed)

## 2023-04-17 NOTE — Evaluation (Signed)
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 Admitted for further mgmt of suspected acute ishemic stroke in stting of presenting central vertigo    PMH includes CAD, PVD, retinal artery occlusion of R eye, hypercholesteremia, HTN, DM   Clinical Impression  Pt admitted with above diagnosis. At baseline, pt is ambulatory with cane.  He reports unsteady at times but no falls.  Pt admitted with vertigo/dizzy.  Initially appeared was acute onset but with further history pt reports has been off/on for months.  No nystagmus noted during session, HINTS difficult to perform due to blind R eye, [employer] and Horizontal roll negative.  Did have + head thrust test and + orthostatic BP but asymptomatic.  Pt did not experience dizziness during PT session.  He was able to ambulate 75'x2 with mild unsteadiness.  Pt currently with functional limitations due to the deficits listed below (see PT Problem List). Pt will benefit from acute skilled PT to increase their independence and safety with mobility to allow discharge.  Will benefit from acute PT to progress and recommend outpt PT at d/c to further address balance.  Orthostatic BP: Supine 192/78 with HR 58 Sitting 146/78 with HR 60 Sitting 3 mins 183/75 with HR 61 Standing 127/77 with HR 63 Standing 3 mins 112/99 with HR 63 Return to supine 191/83 with HR 59 Pt was asymptomatic of drop in BP.  Notified RN.         If plan is discharge home, recommend the following: A little help with bathing/dressing/bathroom;A little help with walking and/or transfers;Assistance with  cooking/housework;Help with stairs or ramp for entrance   Can travel by private vehicle        Equipment Recommendations Rolling walker (2 wheels)  Recommendations for Other Services       Functional Status Assessment Patient has had a recent decline in their functional status and demonstrates the ability to make significant improvements in function in a reasonable and predictable amount of time.     Precautions / Restrictions Precautions Precautions: Fall Restrictions Weight Bearing Restrictions Per Provider Order: No      Mobility  Bed Mobility Overal bed mobility: Needs Assistance Bed Mobility: Supine to Sit, Sit to Supine     Supine to sit: Supervision Sit to supine: Supervision        Transfers Overall transfer level: Needs assistance Equipment used: None Transfers: Sit to/from Stand Sit to Stand: Contact guard assist                Ambulation/Gait Ambulation/Gait assistance: Contact guard assist Gait Distance (Feet): 75 Feet (75'x2) Assistive device: None, Rolling walker (2 wheels) Gait Pattern/deviations: Step-through pattern Gait velocity: decreased     General Gait Details: Pt with cautious , small, slow steps without AD with mild unsteadiness but no overt LOB.  Improved step length and stability with RW.  Pt typically uses cane  Stairs            Wheelchair Mobility     Tilt Bed    Modified Rankin (Stroke Patients Only)       Balance Overall balance assessment: Needs assistance Sitting-balance  support: Feet supported Sitting balance-Leahy Scale: Normal     Standing balance support: No upper extremity supported Standing balance-Leahy Scale: Fair Standing balance comment: Could ambulate without AD but mild unsteadiness; improved with RW                             Pertinent Vitals/Pain Pain Assessment Pain Assessment: No/denies pain    Home Living Family/patient expects to be discharged to:: Private  residence Living Arrangements: Spouse/significant other Available Help at Discharge: Family;Available 24 hours/day Type of Home: Apartment Home Access: Stairs to enter Entrance Stairs-Rails: Can reach both;Left;Right Entrance Stairs-Number of Steps: 6   Home Layout: One level Home Equipment: Cane - single point      Prior Function Prior Level of Function : Needs assist             Mobility Comments: uses cane out in community PRN, 3 LOB instances, but no falls ADLs Comments: ind     Extremity/Trunk Assessment   Upper Extremity Assessment Upper Extremity Assessment: Overall WFL for tasks assessed (ROM WFL; MMT 5/5)    Lower Extremity Assessment Lower Extremity Assessment: Overall WFL for tasks assessed (ROM WFL; MMT 5/5)    Cervical / Trunk Assessment Cervical / Trunk Assessment: Normal  Communication   Communication Communication: No apparent difficulties  Cognition Arousal: Alert Behavior During Therapy: WFL for tasks assessed/performed, Flat affect Overall Cognitive Status: Within Functional Limits for tasks assessed                                 General Comments: Pt with short responses        General Comments General comments (skin integrity, edema, etc.): VSS on RA  Vestibular:   History -Noting pt presenting to hospital after going to sleep on 12/19 normal then waking up 12/20 with dizziness, spinning, and instability.  With further questioning, pt reports this has been on/off for months.  Reports typically occurs when he sits up things spin but after a few mins normalizes.  Reports off and on unsteady on his feet.  Reports can occur if he turns head quickly.  Blind in R eye.  Objective:  Pt blind in R eye  Resting Nystagmus: Negative  Gaze induced Nystagmus: negative  Smooth pursuit: some difficulty initially, likely due to blind R eye, but improved with verbal cues of direction, eyes were coordinated in movement  Gaze  Stabilization: intact, no dizziness  Head Thrust: + correction both direction  Test of Skew: negative, but likely not reliable as blind R eye  Head Shake: negative nystagmus, mild dizziness but no more than expected after 20 quick head shakes  [employer]: negative  Horizontal roll: negative  Orthostatic blood pressures: positive but asymptomatic  Exercises   Supine 192/78 with HR 58 Sitting 146/78 with HR 60 Sitting 3 mins 183/75 with HR 61 Standing 127/77 with HR 63 Standing 3 mins 112/99 with HR 63 Return to supine 191/83 with HR 59 Pt was asymptomatic of drop in BP.  Notified RN.   Assessment/Plan    PT Assessment Patient needs continued PT services  PT Problem List Decreased strength;Decreased range of motion;Decreased activity tolerance;Decreased balance;Decreased mobility;Decreased knowledge of use of DME       PT Treatment Interventions DME instruction;Therapeutic exercise;Gait training;Stair training;Functional mobility training;Therapeutic activities;Patient/family education;Neuromuscular re-education;Balance training    PT Goals (Current goals can be found in  the Care Plan section)  Acute Rehab PT Goals Patient Stated Goal: return home PT Goal Formulation: With patient/family Time For Goal Achievement: 05/01/23 Potential to Achieve Goals: Good Additional Goals Additional Goal #1: Pt will score >19 on DGI to indicated lower fall risk    Frequency       Co-evaluation               AM-PAC PT "6 Clicks" Mobility  Outcome Measure Help needed turning from your back to your side while in a flat bed without using bedrails?: None Help needed moving from lying on your back to sitting on the side of a flat bed without using bedrails?: None Help needed moving to and from a bed to a chair (including a wheelchair)?: A Little Help needed standing up from a chair using your arms (e.g., wheelchair or bedside chair)?: A Little Help needed to walk in hospital  room?: A Little Help needed climbing 3-5 steps with a railing? : A Little 6 Click Score: 20    End of Session Equipment Utilized During Treatment: Gait belt Activity Tolerance: Patient tolerated treatment well Patient left: in bed;with call bell/phone within reach;with bed alarm set;with family/visitor present Nurse Communication: Mobility status PT Visit Diagnosis: Other abnormalities of gait and mobility (R26.89);Dizziness and giddiness (R42)    Time: 1610-9604 PT Time Calculation (min) (ACUTE ONLY): 34 min   Charges:   PT Evaluation $PT Eval Moderate Complexity: 1 Mod PT Treatments $Gait Training: 8-22 mins PT General Charges $$ ACUTE PT VISIT: 1 Visit         Anise Salvo, PT Acute Rehab [facility] [phone]   Rayetta Humphrey 04/17/2023, 1:37 PM

## 2023-04-18 DIAGNOSIS — I639 Cerebral infarction, unspecified: Secondary | ICD-10-CM | POA: Diagnosis not present

## 2023-04-18 LAB — GLUCOSE, CAPILLARY
Glucose-Capillary: 91 mg/dL (ref 70–99)
Glucose-Capillary: 102 mg/dL — ABNORMAL HIGH (ref 70–99)
Glucose-Capillary: 114 mg/dL — ABNORMAL HIGH (ref 70–99)

## 2023-04-18 MED ORDER — ISOSORB DINITRATE-HYDRALAZINE 20-37.5 MG PO TABS: 1.0000 | ORAL_TABLET | Freq: Three times a day (TID) | ORAL | 2 refills | Status: AC

## 2023-04-18 MED ORDER — ACETAMINOPHEN 325 MG PO TABS: 650.0000 mg | ORAL_TABLET | Freq: Four times a day (QID) | ORAL | Status: AC | PRN

## 2023-04-18 MED ORDER — AMLODIPINE BESYLATE 10 MG PO TABS: 10.0000 mg | ORAL_TABLET | Freq: Every day | ORAL | 2 refills | Status: AC

## 2023-04-18 NOTE — Evaluation (Signed)
Speech Language Pathology Evaluation Patient Details Name: Cristian Hayden MRN: [id number] DOB: 1940/05/19 Today's Date: 04/18/2023 Time: 1059-     Problem List:  Patient Active Problem List   Diagnosis Date Noted   Vertigo 04/16/2023   Acute ischemic stroke (HCC) 04/16/2023   CKD stage 3b, GFR 30-44 ml/min (HCC) 04/16/2023   Anemia of chronic disease 04/16/2023   Tobacco abuse 04/16/2023   History of colonic polyps 05/02/2021   Constipation 05/02/2021   Loss of appetite 05/02/2021   HLD (hyperlipidemia) 09/18/2020   Simple chronic bronchitis (HCC) 09/18/2020   Type 2 diabetes mellitus without complication (HCC) 09/18/2020   Pain due to onychomycosis of toenails of both feet 06/18/2020   Diabetic neuropathy (HCC) 06/18/2020   Dyslipidemia 06/21/2019   Coronary artery disease 10/27/2018   Educated about COVID-19 virus infection 10/27/2018   DM2 (diabetes mellitus, type 2) (HCC) 10/27/2018   Pure hypercholesterolemia 10/27/2018   Essential hypertension 10/27/2018   Stomach pain 07/06/2014   Lumbago 07/06/2014   Central retinal artery occlusion of right eye    CAD (coronary artery disease), native coronary artery 12/15/2012   Abnormal EKG 05/25/2012   Numbness in left leg 03/08/2012   Peripheral vascular disease, unspecified (HCC) 03/08/2012   Cold 03/08/2012   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   Past Medical History:  Past Medical History:  Diagnosis Date   Arthritis    Bronchitis    CAD (coronary artery disease)    Diabetes mellitus without complication (HCC)    Hyperlipidemia    Hypertension    Peripheral vascular disease (HCC)    with claludication   Past Surgical History:  Past Surgical History:  Procedure Laterality Date   CATARACT EXTRACTION  June 2013   Cataract Right eye   CORONARY ARTERY BYPASS GRAFT     LIMA to LAD, SVG to PDA, SVG to OM1/OM2   Femoral-Femoral BPGA  05/29/1999   Left profundoplasty  and Left Fem/Pop  05/28/2008   HPI:  Pt 82 y/o M presenting to ED on 12/20 with dizziness and vision changes, CT with no acute abnormality; unable to have MRI; CTA head & neck no intracranial LVO or significant stenosis, 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 Admitted for further mgmt of suspected acute ishemic stroke in srtting of presenting central vertigo    PMH includes CAD, PVD, retinal artery occlusion of R eye, hypercholesteremia, HTN, DM   Assessment / Plan / Recommendation Clinical Impression  Pt presents with fluent, clear speech without dysarthria.  Expresses ideas coherently, follows commands, comprehension is [facility].  Attention and memory WNL. No speech/cognitive deficits identified. No SLP f/u needed. Our service will sign off.    SLP Assessment  SLP Recommendation/Assessment: Patient does not need any further Speech Lanaguage Pathology Services    Recommendations for follow up therapy are one component of a multi-disciplinary discharge planning process, led by the attending physician.  Recommendations may be updated based on patient status, additional functional criteria and insurance authorization.    Follow Up Recommendations  No SLP follow up                       SLP Evaluation Cognition  Overall Cognitive Status: Within Functional Limits for tasks assessed       Comprehension  Auditory Comprehension Overall Auditory Comprehension: Appears within functional limits for tasks assessed Visual Recognition/Discrimination Discrimination: Within Function Limits Reading Comprehension Reading  Status: Not tested    Expression Expression Primary Mode of Expression: Verbal Verbal Expression Overall Verbal Expression: Appears within functional limits for tasks assessed   Oral / Motor  Oral Motor/Sensory Function Overall Oral Motor/Sensory Function: Within functional limits Motor Speech Overall Motor Speech: Appears within functional  limits for tasks assessed            Blenda Mounts Laurice 04/18/2023, 11:10 AM Marchelle Folks L. Samson Frederic, MA CCC/SLP Clinical Specialist - Acute Care SLP Acute Rehabilitation Services Office number [phone]

## 2023-04-18 NOTE — Progress Notes (Signed)
Patient refuses blood sugar monitoring.

## 2023-04-18 NOTE — Plan of Care (Signed)
  Problem: Coping: Goal: Ability to adjust to condition or change in health will improve Outcome: Not Progressing   Problem: Metabolic: Goal: Ability to maintain appropriate glucose levels will improve Outcome: Not Progressing   Problem: Coping: Goal: Will verbalize positive feelings about self Outcome: Not Progressing   Problem: Ischemic Stroke/TIA Tissue Perfusion: Goal: Complications of ischemic stroke/TIA will be minimized Outcome: Not Progressing

## 2023-04-18 NOTE — Discharge Summary (Signed)
DISCHARGE SUMMARY  DEWEL GAVILANES  MR#: [id number]  DOB:1940/10/06  Date of Admission: 04/16/2023 Date of Discharge: 04/18/2023  Attending Physician:Shandon Matson Silvestre Gunner, MD  Patient's GEX:[id number], Suzan Slick, PA  Disposition: D/C home   Follow-up Appts:  Follow-up Information     Norm Salt, PA Follow up in 7 day(s).   Specialty: Physician Assistant Contact information: [address] [place] [address] [phone]                 Tests Needing Follow-up: -Assess heart rate -Assess blood pressure control -Assess dizziness  Discharge Diagnoses: Dizziness/unsteady gait Sinus bradycardia - beta-blocker associated DM2 HTN HLD CKD stage IIIb Anemia of chronic kidney disease Chronic longstanding tobacco abuse  Initial presentation: 82 year old with a history of DM2, HTN, HLD, CKD stage IIIb with baseline creatinine 1.6-2.2, anemia of chronic kidney disease with baseline hemoglobin approximately 11, chronic right eye blindness with glaucoma, and tobacco abuse who presented to the ER 12/20 with complaints of dizziness.  CT head and cervical spine in the ER revealed no evidence of an acute intracranial process.  On neurologic evaluation the patient was felt to have horizontal nystagmus suggesting central vertigo concerning for an acute ischemic CVA.   Hospital Course:  Dizziness/unsteady gait -acute ischemic CVA ruled out with serial CT imaging and clinically -dizziness and unsteady gait possibly due to orthostasis in setting of significant bradycardia likely related to BB therapy -Further history revealed his symptoms were in fact not new and had been persisting for many weeks -Heart rate noted to dipped into the low 40s during monitoring while on beta-blocker -Beta-blocker discontinued and alternative meds initiated for adequate blood pressure control -MRI brain unable to be accomplished as the patient has a retained BB around the right eye -CTa  head and neck without evidence of acute finding with no intracranial large vessel occlusion or significant stenosis but appreciated 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 at the origin of the right ICA -TTE EF 60-65% with normal LV function with mild aortic valve stenosis -medical treatment: On no platelet medications prior to admission -to begin aspirin and Plavix for 21 days followed by aspirin alone -LDL 64 on statin -A1c 6.2 -PT/OT/SLP recommend outpatient therapy and counseled the patient extensively on measures to take when transitioning from positional changes to help avoid orthostasis -Coreg discontinued   DM2 CBG well-controlled during this admission and also chronically as evidenced by A1c of 6.2   HTN Blood pressure elevated with range of 167-210 during this admission -beta-blocker discontinued in setting of bradycardia which appears to be symptomatic -BP appears to be quite variable with systolics at 131 and another times up to 179 - outpatient follow-up with his PCP for blood pressure monitoring will be indicated with addition of Norvasc and BiDil during this admission and discontinuation of Coreg   HLD Continue statin with LDL at goal   CKD stage IIIb Renal function stable   Anemia of chronic kidney disease Hemoglobin stable   Chronic longstanding tobacco abuse Has been counseled on the absolute need to discontinue tobacco abuse  Allergies as of 04/18/2023   No Known Allergies      Medication List     STOP taking these medications    carvedilol 3.125 MG tablet Commonly known as: COREG   lubiprostone 24 MCG capsule Commonly known as: AMITIZA       TAKE these medications    acetaminophen 325 MG tablet Commonly known as: TYLENOL Take 2 tablets (650 mg total) by mouth every 6 (  six) hours as needed for mild pain (pain score 1-3), fever or headache.   amLODipine 10 MG tablet Commonly known as: NORVASC Take 1 tablet (10 mg total) by mouth daily. Start taking on: April 19, 2023    atorvastatin 40 MG tablet Commonly known as: LIPITOR Take 40 mg by mouth daily.   brimonidine 0.2 % ophthalmic solution Commonly known as: ALPHAGAN Place 1 drop into both eyes 2 (two) times daily.   dorzolamide-timolol 2-0.5 % ophthalmic solution Commonly known as: COSOPT Place 1 drop into both eyes 2 (two) times daily.   isosorbide-hydrALAZINE 20-37.5 MG tablet Commonly known as: BIDIL Take 1 tablet by mouth 3 (three) times daily.   latanoprost 0.005 % ophthalmic solution Commonly known as: XALATAN Place 1 drop into both eyes at bedtime.   tamsulosin 0.4 MG Caps capsule Commonly known as: FLOMAX Take 0.4 mg by mouth daily. Reported on 07/10/2015        Day of Discharge BP 116/62 (BP Location: Left Arm)   Pulse (!) 58   Temp 97.7 F (36.5 C) (Oral)   Resp 18   Ht 5\' 4"  (1.626 m)   Wt 54.1 kg   SpO2 100%   BMI 20.47 kg/m   Physical Exam: General: No acute respiratory distress Lungs: Clear to auscultation bilaterally without wheezes or crackles Cardiovascular: Regular rate and rhythm without murmur gallop or rub normal S1 and S2 Abdomen: Nontender, nondistended, soft, bowel sounds positive, no rebound, no ascites, no appreciable mass Extremities: No significant cyanosis, clubbing, or edema bilateral lower extremities  Basic Metabolic Panel: Recent Labs  Lab 04/16/23 0835 04/17/23 0519  NA 138 138  K 4.1 4.2  CL 109 107  CO2 23 22  GLUCOSE 114* 104*  BUN 15 14  CREATININE 1.66* 1.49*  CALCIUM 8.8* 9.1  MG 1.8 1.9    CBC: Recent Labs  Lab 04/16/23 0835 04/17/23 0519  WBC 4.7 4.3  NEUTROABS  --  2.4  HGB 10.8* 11.5*  HCT 34.5* 35.8*  MCV 94.0 91.6  PLT 145* 151    Time spent in discharge (includes decision making & examination of pt): 35 minutes  04/18/2023, 1:44 PM   Lonia Blood, MD [facility] Office  [phone]

## 2023-04-24 ENCOUNTER — Encounter (HOSPITAL_COMMUNITY): Payer: Self-pay

## 2023-04-24 ENCOUNTER — Emergency Department (HOSPITAL_COMMUNITY)
Admission: EM | Admit: 2023-04-24 | Discharge: 2023-04-24 | Disposition: A | Discharge status: Home / Self Care | Payer: Medicare Other | Attending: Emergency Medicine | Admitting: Emergency Medicine

## 2023-04-24 ENCOUNTER — Other Ambulatory Visit: Payer: Self-pay

## 2023-04-24 DIAGNOSIS — E119 Type 2 diabetes mellitus without complications: Secondary | ICD-10-CM | POA: Insufficient documentation

## 2023-04-24 DIAGNOSIS — R42 Dizziness and giddiness: Secondary | ICD-10-CM | POA: Diagnosis present

## 2023-04-24 DIAGNOSIS — I2510 Atherosclerotic heart disease of native coronary artery without angina pectoris: Secondary | ICD-10-CM | POA: Diagnosis not present

## 2023-04-24 DIAGNOSIS — I951 Orthostatic hypotension: Secondary | ICD-10-CM | POA: Insufficient documentation

## 2023-04-24 DIAGNOSIS — K5900 Constipation, unspecified: Secondary | ICD-10-CM | POA: Insufficient documentation

## 2023-04-24 LAB — BASIC METABOLIC PANEL
Anion gap: 8 (ref 5–15)
BUN: 35 mg/dL — ABNORMAL HIGH (ref 8–23)
CO2: 21 mmol/L — ABNORMAL LOW (ref 22–32)
Calcium: 9.3 mg/dL (ref 8.9–10.3)
Chloride: 108 mmol/L (ref 98–111)
Creatinine, Ser: 2.09 mg/dL — ABNORMAL HIGH (ref 0.61–1.24)
GFR, Estimated: 31 mL/min — ABNORMAL LOW (ref >60)
Glucose, Bld: 103 mg/dL — ABNORMAL HIGH (ref 70–99)
Potassium: 5.1 mmol/L (ref 3.5–5.1)
Sodium: 137 mmol/L (ref 135–145)

## 2023-04-24 LAB — CBC WITH DIFFERENTIAL/PLATELET
Abs Immature Granulocytes: 0.01 10*3/uL (ref 0.00–0.07)
Basophils Absolute: 0 10*3/uL (ref 0.0–0.1)
Basophils Relative: 0 %
Eosinophils Absolute: 0.1 10*3/uL (ref 0.0–0.5)
Eosinophils Relative: 2 %
HCT: 32.9 % — ABNORMAL LOW (ref 39.0–52.0)
Hemoglobin: 10.6 g/dL — ABNORMAL LOW (ref 13.0–17.0)
Immature Granulocytes: 0 %
Lymphocytes Relative: 39 %
Lymphs Abs: 2 10*3/uL (ref 0.7–4.0)
MCH: 30.4 pg (ref 26.0–34.0)
MCHC: 32.2 g/dL (ref 30.0–36.0)
MCV: 94.3 fL (ref 80.0–100.0)
Monocytes Absolute: 0.5 10*3/uL (ref 0.1–1.0)
Monocytes Relative: 9 %
Neutro Abs: 2.7 10*3/uL (ref 1.7–7.7)
Neutrophils Relative %: 50 %
Platelets: 170 10*3/uL (ref 150–400)
RBC: 3.49 MIL/uL — ABNORMAL LOW (ref 4.22–5.81)
RDW: 14.3 % (ref 11.5–15.5)
WBC: 5.3 10*3/uL (ref 4.0–10.5)
nRBC: 0 % (ref 0.0–0.2)

## 2023-04-24 MED ORDER — SODIUM CHLORIDE 0.9 % IV BOLUS
500.0000 mL | Freq: Once | INTRAVENOUS | Status: AC
Start: 2023-04-24 — End: 2023-04-24
  Administered 2023-04-24: 500 mL via INTRAVENOUS

## 2023-04-24 MED ORDER — POLYETHYLENE GLYCOL 3350 17 G PO PACK: 17.0000 g | PACK | Freq: Every day | ORAL | 0 refills | Status: DC

## 2023-04-24 MED ORDER — METAMUCIL SMOOTH TEXTURE 58.6 % PO POWD: 1.0000 | Freq: Three times a day (TID) | ORAL | 12 refills | Status: DC

## 2023-04-24 MED ORDER — POLYETHYLENE GLYCOL 3350 17 G PO PACK: 17.0000 g | PACK | Freq: Every day | ORAL | 0 refills | Status: AC

## 2023-04-24 MED ORDER — SENNOSIDES-DOCUSATE SODIUM 8.6-50 MG PO TABS
1.0000 | ORAL_TABLET | Freq: Every day | ORAL | 0 refills | Status: AC
Start: 2023-04-24 — End: ?

## 2023-04-24 MED ORDER — SENNOSIDES-DOCUSATE SODIUM 8.6-50 MG PO TABS: 1.0000 | ORAL_TABLET | Freq: Every day | ORAL | 0 refills | Status: DC

## 2023-04-24 MED ORDER — METAMUCIL SMOOTH TEXTURE 58.6 % PO POWD: 1.0000 | Freq: Three times a day (TID) | ORAL | 12 refills | Status: AC

## 2023-04-24 NOTE — ED Triage Notes (Addendum)
Pt complaining of feeling dizzy and faint every time the pt stands up. Pt denies any pain, does endorse constipation. Pt also just stared a new blood pressure medication.

## 2023-04-24 NOTE — ED Provider Notes (Signed)
[place] EMERGENCY DEPARTMENT AT [facility] Provider Note   CSN: [id number] Arrival date & time: 04/24/23  1537     History  Chief Complaint  Patient presents with   Dizziness   Hypotension    Cristian Hayden is a 82 y.o. male.  Patient is an 82 year old male with a past medical history of CAD, hypertension, diabetes presenting to the emergency department with dizziness.  The patient states he has had dizziness ongoing for the last several weeks.  He states that he was recently admitted to the hospital to have a stroke rule out and had changes to his blood pressure medication made then.  He states since then the dizziness has continued.  He states that he feels dizzy every time he sits up or stands and feels like he might fall.  He denies any associated nausea, vomiting, chest pain or shortness of breath.  He states that he has been constipated recently and has been urinating less than usual.  The history is provided by the patient.  Dizziness      Home Medications Prior to Admission medications   Medication Sig Start Date End Date Taking? Authorizing Provider  acetaminophen (TYLENOL) 325 MG tablet Take 2 tablets (650 mg total) by mouth every 6 (six) hours as needed for mild pain (pain score 1-3), fever or headache. 04/18/23   Lonia Blood, MD  amLODipine (NORVASC) 10 MG tablet Take 1 tablet (10 mg total) by mouth daily. 04/19/23   Lonia Blood, MD  atorvastatin (LIPITOR) 40 MG tablet Take 40 mg by mouth daily.  06/02/16   [provider]  brimonidine (ALPHAGAN) 0.2 % ophthalmic solution Place 1 drop into both eyes 2 (two) times daily. 08/14/20   [provider]  dorzolamide-timolol (COSOPT) 22.3-6.8 MG/ML ophthalmic solution Place 1 drop into both eyes 2 (two) times daily. 09/08/16   [provider]  isosorbide-hydrALAZINE (BIDIL) 20-37.5 MG tablet Take 1 tablet by mouth 3 (three) times daily. 04/18/23   Lonia Blood, MD   latanoprost (XALATAN) 0.005 % ophthalmic solution Place 1 drop into both eyes at bedtime. 09/08/16   [provider]  polyethylene glycol (MIRALAX) 17 g packet Take 17 g by mouth daily. 04/24/23   Elayne Snare K, DO  psyllium (METAMUCIL SMOOTH TEXTURE) 58.6 % powder Take 1 packet by mouth 3 (three) times daily. 04/24/23   Elayne Snare K, DO  senna-docusate (SENOKOT-S) 8.6-50 MG tablet Take 1 tablet by mouth daily. 04/24/23   Elayne Snare K, DO  tamsulosin (FLOMAX) 0.4 MG CAPS capsule Take 0.4 mg by mouth daily. Reported on 07/10/2015    [provider]      Allergies    Patient has no known allergies.    Review of Systems   Review of Systems  Neurological:  Positive for dizziness.    Physical Exam Updated Vital Signs BP (!) 117/58   Pulse 62   Temp 97.6 F (36.4 C)   Resp 14   SpO2 100%  Physical Exam Vitals and nursing note reviewed.  Constitutional:      General: He is not in acute distress.    Appearance: Normal appearance.  HENT:     Head: Normocephalic.     Nose: Nose normal.     Mouth/Throat:     Mouth: Mucous membranes are moist.     Pharynx: Oropharynx is clear.  Eyes:     Extraocular Movements: Extraocular movements intact.     Conjunctiva/sclera: Conjunctivae normal.  Pupils: Pupils are equal, round, and reactive to light.     Comments: No nystagmus  Cardiovascular:     Rate and Rhythm: Normal rate and regular rhythm.     Heart sounds: Normal heart sounds.  Pulmonary:     Effort: Pulmonary effort is normal.     Breath sounds: Normal breath sounds.  Abdominal:     General: Abdomen is flat.     Palpations: Abdomen is soft.     Tenderness: There is no abdominal tenderness.  Musculoskeletal:        General: Normal range of motion.     Cervical back: Normal range of motion.  Skin:    General: Skin is warm and dry.  Neurological:     General: No focal deficit present.     Mental Status: He is alert and oriented to  person, place, and time.     Cranial Nerves: No cranial nerve deficit.     Sensory: No sensory deficit.     Motor: No weakness.     Coordination: Coordination normal.  Psychiatric:        Mood and Affect: Mood normal.        Behavior: Behavior normal.     ED Results / Procedures / Treatments   Labs (all labs ordered are listed, but only abnormal results are displayed) Labs Reviewed  BASIC METABOLIC PANEL - Abnormal; Notable for the following components:      Result Value   CO2 21 (*)    Glucose, Bld 103 (*)    BUN 35 (*)    Creatinine, Ser 2.09 (*)    GFR, Estimated 31 (*)    All other components within normal limits  CBC WITH DIFFERENTIAL/PLATELET - Abnormal; Notable for the following components:   RBC 3.49 (*)    Hemoglobin 10.6 (*)    HCT 32.9 (*)    All other components within normal limits    EKG EKG Interpretation Date/Time:  Saturday April 24 2023 15:47:18 EST Ventricular Rate:  65 PR Interval:  157 QRS Duration:  90 QT Interval:  396 QTC Calculation: 412 R Axis:   59  Text Interpretation: Sinus rhythm No significant change since last tracing Confirmed by Elayne Snare ([id number]) on 04/24/2023 4:09:32 PM  Radiology No results found.  Procedures Procedures    Medications Ordered in ED Medications  sodium chloride 0.9 % bolus 500 mL (has no administration in time range)  sodium chloride 0.9 % bolus 500 mL (0 mLs Intravenous Stopped 04/24/23 1841)    ED Course/ Medical Decision Making/ A&P Clinical Course as of 04/24/23 1855  Sat Apr 24, 2023  1702 Mild AKI on labs, otherwise within normal range. [VK]  1755 Orthostatics positive from sitting to standing. [VK]    Clinical Course User Index [VK] Rexford Maus, DO                                 Medical Decision Making This patient presents to the ED with chief complaint(s) of dizziness with pertinent past medical history of hypertension, diabetes, CAD which further complicates the  presenting complaint. The complaint involves an extensive differential diagnosis and also carries with it a high risk of complications and morbidity.    The differential diagnosis includes orthostatic hypotension, arrhythmia, anemia, dehydration, electrolyte abnormality, no focal neurologic deficits making CVA less likely  Additional history obtained: Additional history obtained from N/A Records reviewed previous admission documents  ED Course and Reassessment: On patient's arrival he was initially hypotensive to 79/49, blood pressures in the 100s on my evaluation in the room, patient is currently asymptomatic sitting in the bed.  EKG on arrival showed normal sinus rhythm without acute ischemic changes.  Patient will have labs and will be given fluids and will have orthostatic vitals performed and will be closely reassessed.  Independent labs interpretation:  The following labs were independently interpreted: mild increased Cr from baseline, otherwise no acute findings  Independent visualization of imaging: - N/A  Consultation: - Consulted or discussed management/test interpretation w/ external professional: N/A  Consideration for admission or further workup: Patient has no emergent conditions requiring admission or further work-up at this time and is stable for discharge home with primary care follow-up  Social Determinants of health: N/A    Amount and/or Complexity of Data Reviewed Labs: ordered.  Risk OTC drugs.          Final Clinical Impression(s) / ED Diagnoses Final diagnoses:  Orthostatic hypotension  Constipation, unspecified constipation type    Rx / DC Orders ED Discharge Orders          Ordered    Compression stockings        04/24/23 1829    polyethylene glycol (MIRALAX) 17 g packet  Daily,   Status:  Discontinued        04/24/23 1830    psyllium (METAMUCIL SMOOTH TEXTURE) 58.6 % powder  3 times daily,   Status:  Discontinued        04/24/23 1830     senna-docusate (SENOKOT-S) 8.6-50 MG tablet  Daily,   Status:  Discontinued        04/24/23 1830    polyethylene glycol (MIRALAX) 17 g packet  Daily        04/24/23 1854    psyllium (METAMUCIL SMOOTH TEXTURE) 58.6 % powder  3 times daily        04/24/23 1854    senna-docusate (SENOKOT-S) 8.6-50 MG tablet  Daily        04/24/23 1854              Rexford Maus, DO 04/24/23 1855

## 2023-04-24 NOTE — Discharge Instructions (Addendum)
You were seen in the emergency department for your dizziness.  Your blood pressure was low here especially when you are switching positions and this is called orthostatic hypotension.  It is likely related to the new blood pressure medicines that you have been prescribed.  You should stop taking the BiDil and you can continue to take your amlodipine to prevent your blood pressure from dropping so low.  You should get a blood pressure cuff to monitor your blood pressures at home and keep a log of how your blood pressures are running.  You should also wear compression stockings when you are standing and walking to help get the blood flow up to your heart and your head quicker when you are switching positions to prevent the dizziness. For your constipation, I have given you a bowel regimen of medications that you should take for your constipation.  You can then take MiraLAX every day until you are having regular soft bowel movements daily, then can cut the dose in half until you are again having regular soft bowel movements daily and then can use as needed.  You should also take the Senokot daily to help keep your stools soft.  Metamucil is a fiber supplement that you should take daily to help prevent recurrent constipation.  You should make sure that you are drinking plenty of fluids and staying well-hydrated.  You can follow-up with your primary doctor in the next few days to have your symptoms and blood pressure rechecked.  You should return to the emergency department if you pass out, you have worsening dizziness, you have chest pain, you have fevers or if you have any other new or concerning symptoms.

## 2023-05-19 DIAGNOSIS — E1122 Type 2 diabetes mellitus with diabetic chronic kidney disease: Secondary | ICD-10-CM | POA: Diagnosis not present

## 2023-05-19 DIAGNOSIS — J410 Simple chronic bronchitis: Secondary | ICD-10-CM | POA: Diagnosis not present

## 2023-05-19 DIAGNOSIS — Z794 Long term (current) use of insulin: Secondary | ICD-10-CM | POA: Diagnosis not present

## 2023-05-19 DIAGNOSIS — E782 Mixed hyperlipidemia: Secondary | ICD-10-CM | POA: Diagnosis not present

## 2023-05-19 DIAGNOSIS — I10 Essential (primary) hypertension: Secondary | ICD-10-CM | POA: Diagnosis not present

## 2023-05-19 DIAGNOSIS — K5900 Constipation, unspecified: Secondary | ICD-10-CM | POA: Diagnosis not present

## 2023-05-19 DIAGNOSIS — M542 Cervicalgia: Secondary | ICD-10-CM | POA: Diagnosis not present

## 2023-05-21 DIAGNOSIS — H43822 Vitreomacular adhesion, left eye: Secondary | ICD-10-CM | POA: Diagnosis not present

## 2023-05-21 DIAGNOSIS — H401132 Primary open-angle glaucoma, bilateral, moderate stage: Secondary | ICD-10-CM | POA: Diagnosis not present

## 2023-05-21 DIAGNOSIS — H04123 Dry eye syndrome of bilateral lacrimal glands: Secondary | ICD-10-CM | POA: Diagnosis not present

## 2023-05-21 DIAGNOSIS — H35033 Hypertensive retinopathy, bilateral: Secondary | ICD-10-CM | POA: Diagnosis not present

## 2023-05-21 DIAGNOSIS — Z961 Presence of intraocular lens: Secondary | ICD-10-CM | POA: Diagnosis not present

## 2023-05-21 DIAGNOSIS — H47011 Ischemic optic neuropathy, right eye: Secondary | ICD-10-CM | POA: Diagnosis not present

## 2023-06-07 ENCOUNTER — Ambulatory Visit: Payer: Medicare Other | Admitting: Orthopaedic Surgery

## 2023-07-02 DIAGNOSIS — H35033 Hypertensive retinopathy, bilateral: Secondary | ICD-10-CM | POA: Diagnosis not present

## 2023-07-02 DIAGNOSIS — Z961 Presence of intraocular lens: Secondary | ICD-10-CM | POA: Diagnosis not present

## 2023-07-02 DIAGNOSIS — H43822 Vitreomacular adhesion, left eye: Secondary | ICD-10-CM | POA: Diagnosis not present

## 2023-07-02 DIAGNOSIS — H47011 Ischemic optic neuropathy, right eye: Secondary | ICD-10-CM | POA: Diagnosis not present

## 2023-07-02 DIAGNOSIS — H04123 Dry eye syndrome of bilateral lacrimal glands: Secondary | ICD-10-CM | POA: Diagnosis not present

## 2023-07-02 DIAGNOSIS — H401132 Primary open-angle glaucoma, bilateral, moderate stage: Secondary | ICD-10-CM | POA: Diagnosis not present

## 2023-07-05 DIAGNOSIS — R3912 Poor urinary stream: Secondary | ICD-10-CM | POA: Diagnosis not present

## 2023-07-08 ENCOUNTER — Encounter: Payer: Self-pay | Admitting: Podiatry

## 2023-07-08 ENCOUNTER — Ambulatory Visit (INDEPENDENT_AMBULATORY_CARE_PROVIDER_SITE_OTHER): Admitting: Podiatry

## 2023-07-08 VITALS — Ht 64.0 in | Wt 119.0 lb

## 2023-07-08 DIAGNOSIS — M79674 Pain in right toe(s): Secondary | ICD-10-CM | POA: Diagnosis not present

## 2023-07-08 DIAGNOSIS — E1142 Type 2 diabetes mellitus with diabetic polyneuropathy: Secondary | ICD-10-CM

## 2023-07-08 DIAGNOSIS — M79675 Pain in left toe(s): Secondary | ICD-10-CM | POA: Diagnosis not present

## 2023-07-08 DIAGNOSIS — I739 Peripheral vascular disease, unspecified: Secondary | ICD-10-CM

## 2023-07-08 DIAGNOSIS — B351 Tinea unguium: Secondary | ICD-10-CM | POA: Diagnosis not present

## 2023-07-08 NOTE — Progress Notes (Signed)
 This patient returns to my office for at risk foot care.  This patient requires this care by a professional since this patient will be at risk due to having diabetes mellitus and pvd.    This patient is unable to cut nails himself since the patient cannot reach his nails.These nails are painful walking and wearing shoes.  This patient presents for at risk foot care today.  General Appearance  Alert, conversant and in no acute stress.  Vascular  Dorsalis pedis and posterior tibial  pulses are weakly  palpable  bilaterally.  Capillary return is within normal limits  bilaterally. Cold feet  Bilaterally. Absent digital hair  B/L.  Neurologic  Senn-Weinstein monofilament wire test within normal limits  bilaterally. Muscle power within normal limits bilaterally.  Nails Thick disfigured discolored nails with subungual debris  from hallux to fifth toes bilaterally. No evidence of bacterial infection or drainage bilaterally.  Orthopedic  No limitations of motion  feet .  No crepitus or effusions noted. HAV  B/L.  Skin  normotropic skin with no porokeratosis noted bilaterally.  No signs of infections or ulcers noted.     Onychomycosis  Pain in right toes  Pain in left toes  Consent was obtained for treatment procedures.   Mechanical debridement of nails 1-5  bilaterally performed with a nail nipper.  Filed with dremel without incident.    Return office visit     4  months                 Told patient to return for periodic foot care and evaluation due to potential at risk complications.   Helane Gunther DPM

## 2023-07-12 DIAGNOSIS — N189 Chronic kidney disease, unspecified: Secondary | ICD-10-CM | POA: Diagnosis not present

## 2023-07-12 DIAGNOSIS — I129 Hypertensive chronic kidney disease with stage 1 through stage 4 chronic kidney disease, or unspecified chronic kidney disease: Secondary | ICD-10-CM | POA: Diagnosis not present

## 2023-07-12 DIAGNOSIS — N1832 Chronic kidney disease, stage 3b: Secondary | ICD-10-CM | POA: Diagnosis not present

## 2023-07-12 DIAGNOSIS — D631 Anemia in chronic kidney disease: Secondary | ICD-10-CM | POA: Diagnosis not present

## 2023-08-16 DIAGNOSIS — H04123 Dry eye syndrome of bilateral lacrimal glands: Secondary | ICD-10-CM | POA: Diagnosis not present

## 2023-08-16 DIAGNOSIS — H401132 Primary open-angle glaucoma, bilateral, moderate stage: Secondary | ICD-10-CM | POA: Diagnosis not present

## 2023-08-16 DIAGNOSIS — H43822 Vitreomacular adhesion, left eye: Secondary | ICD-10-CM | POA: Diagnosis not present

## 2023-08-16 DIAGNOSIS — H47011 Ischemic optic neuropathy, right eye: Secondary | ICD-10-CM | POA: Diagnosis not present

## 2023-08-16 DIAGNOSIS — H35033 Hypertensive retinopathy, bilateral: Secondary | ICD-10-CM | POA: Diagnosis not present

## 2023-08-17 DIAGNOSIS — E782 Mixed hyperlipidemia: Secondary | ICD-10-CM | POA: Diagnosis not present

## 2023-08-17 DIAGNOSIS — I9589 Other hypotension: Secondary | ICD-10-CM | POA: Diagnosis not present

## 2023-08-17 DIAGNOSIS — I10 Essential (primary) hypertension: Secondary | ICD-10-CM | POA: Diagnosis not present

## 2023-08-17 DIAGNOSIS — M542 Cervicalgia: Secondary | ICD-10-CM | POA: Diagnosis not present

## 2023-08-17 DIAGNOSIS — J410 Simple chronic bronchitis: Secondary | ICD-10-CM | POA: Diagnosis not present

## 2023-08-17 DIAGNOSIS — E1122 Type 2 diabetes mellitus with diabetic chronic kidney disease: Secondary | ICD-10-CM | POA: Diagnosis not present

## 2023-08-17 DIAGNOSIS — Z794 Long term (current) use of insulin: Secondary | ICD-10-CM | POA: Diagnosis not present

## 2023-08-17 DIAGNOSIS — K5900 Constipation, unspecified: Secondary | ICD-10-CM | POA: Diagnosis not present

## 2023-08-17 NOTE — Progress Notes (Signed)
   08/17/2023  Patient ID: Cristian Hayden, male   DOB: Sep 14, 1940, 84 y.o.   MRN: [id number]  Contacted patient regarding medication adherence from a quality report for [facility]. The patient failed MAC in 2024.     Per payor portal fill history: Atorvastatin  40 mg - last filled 07/26/23 for a 90-day supply.    I will follow up for adherence monitoring.   Thank you for allowing pharmacy to be a part of this patient's care.    Livia Riffle, PharmD Clinical Pharmacist  [phone]

## 2023-08-24 DIAGNOSIS — I9589 Other hypotension: Secondary | ICD-10-CM | POA: Diagnosis not present

## 2023-08-24 DIAGNOSIS — N1832 Chronic kidney disease, stage 3b: Secondary | ICD-10-CM | POA: Diagnosis not present

## 2023-08-24 DIAGNOSIS — I10 Essential (primary) hypertension: Secondary | ICD-10-CM | POA: Diagnosis not present

## 2023-08-24 DIAGNOSIS — K5900 Constipation, unspecified: Secondary | ICD-10-CM | POA: Diagnosis not present

## 2023-08-24 DIAGNOSIS — E1122 Type 2 diabetes mellitus with diabetic chronic kidney disease: Secondary | ICD-10-CM | POA: Diagnosis not present

## 2023-08-24 DIAGNOSIS — Z794 Long term (current) use of insulin: Secondary | ICD-10-CM | POA: Diagnosis not present

## 2023-08-24 DIAGNOSIS — J410 Simple chronic bronchitis: Secondary | ICD-10-CM | POA: Diagnosis not present

## 2023-08-24 DIAGNOSIS — E782 Mixed hyperlipidemia: Secondary | ICD-10-CM | POA: Diagnosis not present

## 2023-11-08 ENCOUNTER — Ambulatory Visit: Admitting: Podiatry

## 2023-11-08 ENCOUNTER — Encounter: Payer: Self-pay | Admitting: Podiatry

## 2023-11-08 DIAGNOSIS — I739 Peripheral vascular disease, unspecified: Secondary | ICD-10-CM | POA: Diagnosis not present

## 2023-11-08 DIAGNOSIS — E1142 Type 2 diabetes mellitus with diabetic polyneuropathy: Secondary | ICD-10-CM

## 2023-11-08 DIAGNOSIS — M79675 Pain in left toe(s): Secondary | ICD-10-CM | POA: Diagnosis not present

## 2023-11-08 DIAGNOSIS — M79674 Pain in right toe(s): Secondary | ICD-10-CM | POA: Diagnosis not present

## 2023-11-08 DIAGNOSIS — B351 Tinea unguium: Secondary | ICD-10-CM

## 2023-11-08 NOTE — Progress Notes (Signed)
 This patient returns to my office for at risk foot care.  This patient requires this care by a professional since this patient will be at risk due to having diabetes mellitus and pvd.    This patient is unable to cut nails himself since the patient cannot reach his nails.These nails are painful walking and wearing shoes.  This patient presents for at risk foot care today.  General Appearance  Alert, conversant and in no acute stress.  Vascular  Dorsalis pedis and posterior tibial  pulses are weakly  palpable  bilaterally.  Capillary return is within normal limits  bilaterally. Cold feet  Bilaterally. Absent digital hair  B/L.  Neurologic  Senn-Weinstein monofilament wire test within normal limits  bilaterally. Muscle power within normal limits bilaterally.  Nails Thick disfigured discolored nails with subungual debris  from hallux to fifth toes bilaterally. No evidence of bacterial infection or drainage bilaterally.  Orthopedic  No limitations of motion  feet .  No crepitus or effusions noted. HAV  B/L.  Skin  normotropic skin with no porokeratosis noted bilaterally.  No signs of infections or ulcers noted.     Onychomycosis  Pain in right toes  Pain in left toes  Consent was obtained for treatment procedures.   Mechanical debridement of nails 1-5  bilaterally performed with a nail nipper.  Filed with dremel without incident.    Return office visit     4  months                 Told patient to return for periodic foot care and evaluation due to potential at risk complications.   Helane Gunther DPM

## 2023-11-23 DIAGNOSIS — J410 Simple chronic bronchitis: Secondary | ICD-10-CM | POA: Diagnosis not present

## 2023-11-23 DIAGNOSIS — K5900 Constipation, unspecified: Secondary | ICD-10-CM | POA: Diagnosis not present

## 2023-11-23 DIAGNOSIS — E782 Mixed hyperlipidemia: Secondary | ICD-10-CM | POA: Diagnosis not present

## 2023-11-23 DIAGNOSIS — I10 Essential (primary) hypertension: Secondary | ICD-10-CM | POA: Diagnosis not present

## 2023-11-23 DIAGNOSIS — E1122 Type 2 diabetes mellitus with diabetic chronic kidney disease: Secondary | ICD-10-CM | POA: Diagnosis not present

## 2023-11-23 DIAGNOSIS — N1832 Chronic kidney disease, stage 3b: Secondary | ICD-10-CM | POA: Diagnosis not present

## 2023-12-09 DIAGNOSIS — R3912 Poor urinary stream: Secondary | ICD-10-CM | POA: Diagnosis not present

## 2023-12-14 DIAGNOSIS — H401132 Primary open-angle glaucoma, bilateral, moderate stage: Secondary | ICD-10-CM | POA: Diagnosis not present

## 2023-12-14 DIAGNOSIS — H35033 Hypertensive retinopathy, bilateral: Secondary | ICD-10-CM | POA: Diagnosis not present

## 2023-12-14 DIAGNOSIS — H04123 Dry eye syndrome of bilateral lacrimal glands: Secondary | ICD-10-CM | POA: Diagnosis not present

## 2023-12-14 DIAGNOSIS — H47011 Ischemic optic neuropathy, right eye: Secondary | ICD-10-CM | POA: Diagnosis not present

## 2023-12-14 DIAGNOSIS — H43822 Vitreomacular adhesion, left eye: Secondary | ICD-10-CM | POA: Diagnosis not present

## 2023-12-14 DIAGNOSIS — Z961 Presence of intraocular lens: Secondary | ICD-10-CM | POA: Diagnosis not present

## 2023-12-14 DIAGNOSIS — H16223 Keratoconjunctivitis sicca, not specified as Sjogren's, bilateral: Secondary | ICD-10-CM | POA: Diagnosis not present

## 2024-03-01 ENCOUNTER — Ambulatory Visit: Admitting: Gastroenterology

## 2024-03-01 NOTE — Progress Notes (Deleted)
 Cristian Hayden [id number] [birth year]   Chief Complaint:  Referring Provider: Rosalea Rosina SAILOR, PA Primary GI MD: Dr. Wilhelmenia  HPI: Cristian Hayden is a 83 y.o. male with past medical history of arthritis, CAD, prior CABG, diabetes, HLD, HTN, peripheral vascular disease, who presents today for a complaint of *** .    08/29/2020 patient seen in clinic by Delon Failing, PA-C for blood in his stool and some weight loss. He described a colonoscopy back in the 2000's but was not sure. At that point on chronic laxative every 3 to 5 days for constipation. This was unchanged. He had not seen any further blood in the stool since using Hydrocortisone suppositories. Offered further workup for weight loss and blood in stool with a CT abdomen pelvis or EGD/colonoscopy but patient declined.   Patient last seen in office 03/03/2023 by Delon Failing, PA-C for chronic constipation which had worsened over the previous year or 2.  Possible procedures including CT or colonoscopy were again discussed, patient opted to try medication first.  Was started on Amitiza  24 mcg twice daily with food, had previously been on Linzess without much success.  Discussed the use of AI scribe software for clinical note transcription with the patient, who gave verbal consent to proceed.  History of Present Illness       Previous GI Procedures/Imaging   CT chest/abdomen/pelvis without contrast 09/06/2022 1. No evidence of significant acute traumatic injury to the chest, abdomen or pelvis on today's noncontrast CT examination. 2. Cholelithiasis without evidence of acute cholecystitis. 3. Aortic atherosclerosis, in addition to left main and three-vessel coronary artery disease. Status post median sternotomy for CABG including LIMA to the LAD. 4. There are calcifications of the aortic valve. Echocardiographic correlation for evaluation of potential valvular dysfunction may be warranted if clinically indicated. 5.  Prostatomegaly with median lobe hypertrophy in the prostate gland. 6. Small hiatal hernia.   Past Medical History:  Diagnosis Date   Arthritis    Bronchitis    CAD (coronary artery disease)    Diabetes mellitus without complication (HCC)    Hyperlipidemia    Hypertension    Peripheral vascular disease    with claludication    Past Surgical History:  Procedure Laterality Date   CATARACT EXTRACTION  June 2013   Cataract Right eye   CORONARY ARTERY BYPASS GRAFT     LIMA to LAD, SVG to PDA, SVG to OM1/OM2   Femoral-Femoral BPGA  05/29/1999   Left profundoplasty  and Left Fem/Pop 05/28/2008    Current Outpatient Medications  Medication Sig Dispense Refill   acetaminophen  (TYLENOL ) 325 MG tablet Take 2 tablets (650 mg total) by mouth every 6 (six) hours as needed for mild pain (pain score 1-3), fever or headache.     amLODipine  (NORVASC ) 10 MG tablet Take 1 tablet (10 mg total) by mouth daily. 30 tablet 2   atorvastatin  (LIPITOR) 40 MG tablet Take 40 mg by mouth daily.      brimonidine  (ALPHAGAN ) 0.2 % ophthalmic solution Place 1 drop into both eyes 2 (two) times daily.     dorzolamide -timolol  (COSOPT ) 22.3-6.8 MG/ML ophthalmic solution Place 1 drop into both eyes 2 (two) times daily.  4   [Paused] isosorbide -hydrALAZINE  (BIDIL ) 20-37.5 MG tablet Take 1 tablet by mouth 3 (three) times daily. (Patient not taking: Reported on 11/08/2023) 90 tablet 2   latanoprost  (XALATAN ) 0.005 % ophthalmic solution Place 1 drop into both eyes at bedtime.  4   polyethylene glycol (MIRALAX ) 17  g packet Take 17 g by mouth daily. 14 each 0   psyllium (METAMUCIL SMOOTH TEXTURE) 58.6 % powder Take 1 packet by mouth 3 (three) times daily. 283 g 12   senna-docusate (SENOKOT-S) 8.6-50 MG tablet Take 1 tablet by mouth daily. 30 tablet 0   tamsulosin (FLOMAX) 0.4 MG CAPS capsule Take 0.4 mg by mouth daily. Reported on 07/10/2015     No current facility-administered medications for this visit.    Allergies as of  03/01/2024   (No Known Allergies)    Family History  Problem Relation Age of Onset   Hypertension Mother    Heart disease Father 40       CAD   Hypertension Father    Hypertension Sister    Hypertension Brother     Social History   Tobacco Use   Smoking status: Some Days    Current packs/day: 0.25    Types: Cigarettes    Passive exposure: Past   Smokeless tobacco: Never   Tobacco comments:    pt states he is trying, only smokes about 2-3 per day  Vaping Use   Vaping status: Never Used  Substance Use Topics   Alcohol use: No    Comment: former   Drug use: No     Review of Systems:    Constitutional: No weight loss, fever, chills, weakness or fatigue Eyes: No change in vision Ears, Nose, Throat:  No change in hearing or congestion Skin: No rash or itching Cardiovascular: No chest pain, chest pressure or palpitations   Respiratory: No SOB or cough Gastrointestinal: See HPI and otherwise negative Genitourinary: No dysuria or change in urinary frequency Neurological: No headache, dizziness or syncope Musculoskeletal: No new muscle or joint pain Hematologic: No bleeding or bruising    Physical Exam:  Vital signs: There were no vitals taken for this visit.  Constitutional: NAD, Well developed, Well nourished, alert and cooperative Head:  Normocephalic and atraumatic.  Eyes: No scleral icterus. Conjunctiva pink. Mouth: No oral lesions. Respiratory: Respirations even and unlabored. Lungs clear to auscultation bilaterally.  No wheezes, crackles, or rhonchi.  Cardiovascular:  Regular rate and rhythm. No murmurs. No peripheral edema. Gastrointestinal:  Soft, nondistended, nontender. No rebound or guarding. Normal bowel sounds. No appreciable masses or hepatomegaly. Rectal:  Not performed.  Neurologic:  Alert and oriented x4;  grossly normal neurologically.  Skin:   Dry and intact without significant lesions or rashes. Psychiatric: Oriented to person, place and time.  Demonstrates good judgement and reason without abnormal affect or behaviors.   RELEVANT LABS AND IMAGING: CBC    Component Value Date/Time   WBC 5.3 04/24/2023 1605   RBC 3.49 (L) 04/24/2023 1605   HGB 10.6 (L) 04/24/2023 1605   HGB 13.8 11/17/2018 1125   HCT 32.9 (L) 04/24/2023 1605   HCT 40.2 11/17/2018 1125   PLT 170 04/24/2023 1605   PLT 177 11/17/2018 1125   MCV 94.3 04/24/2023 1605   MCV 88 11/17/2018 1125   MCH 30.4 04/24/2023 1605   MCHC 32.2 04/24/2023 1605   RDW 14.3 04/24/2023 1605   RDW 13.6 11/17/2018 1125   LYMPHSABS 2.0 04/24/2023 1605   LYMPHSABS 2.7 11/17/2018 1125   MONOABS 0.5 04/24/2023 1605   EOSABS 0.1 04/24/2023 1605   EOSABS 0.1 11/17/2018 1125   BASOSABS 0.0 04/24/2023 1605   BASOSABS 0.0 11/17/2018 1125    CMP     Component Value Date/Time   NA 137 04/24/2023 1605   NA 146 (H) 11/17/2018 1125  K 5.1 04/24/2023 1605   CL 108 04/24/2023 1605   CO2 21 (L) 04/24/2023 1605   GLUCOSE 103 (H) 04/24/2023 1605   BUN 35 (H) 04/24/2023 1605   BUN 10 11/17/2018 1125   CREATININE 2.09 (H) 04/24/2023 1605   CALCIUM  9.3 04/24/2023 1605   PROT 6.8 04/17/2023 0519   PROT 7.0 11/17/2018 1125   ALBUMIN 3.5 04/17/2023 0519   ALBUMIN 4.7 11/17/2018 1125   AST 16 04/17/2023 0519   ALT 10 04/17/2023 0519   ALKPHOS 83 04/17/2023 0519   BILITOT 0.6 04/17/2023 0519   BILITOT 0.5 11/17/2018 1125   GFRNONAA 31 (L) 04/24/2023 1605   GFRAA 99 11/17/2018 1125   Echocardiogram 04/17/2023 1. Left ventricular ejection fraction, by estimation, is 60 to 65% . The left ventricle has normal function. The left ventricle has no regional wall motion abnormalities. Left ventricular diastolic parameters are indeterminate.  2. Right ventricular systolic function is normal. The right ventricular size is normal. There is normal pulmonary artery systolic pressure. The estimated right ventricular systolic pressure is 32. 8 mmHg.  3. The mitral valve is normal in structure.  Trivial mitral valve regurgitation. No evidence of mitral stenosis.  4. The aortic valve is calcified. There is moderate calcification of the aortic valve. Aortic valve regurgitation is trivial. Mild aortic valve stenosis.  5. The inferior vena cava is normal in size with greater than 50% respiratory variability, suggesting right atrial pressure of 3 mmHg.  Assessment/Plan:    Assessment and Plan Assessment & Plan        Camie Furbish, PA-C [facility] 03/01/2024, 8:35 AM  Patient Care Team: Rosalea Rosina SAILOR, PA as PCP - General (Physician Assistant) Lavona Agent, MD as PCP - Cardiology (Cardiology) Lavona Agent, MD as Consulting Physician (Cardiology) Christine Rush, DPM as Consulting Physician (Podiatry)

## 2024-03-06 ENCOUNTER — Other Ambulatory Visit: Payer: Self-pay

## 2024-03-06 ENCOUNTER — Emergency Department (HOSPITAL_COMMUNITY)
Admission: EM | Admit: 2024-03-06 | Discharge: 2024-03-06 | Disposition: A | Discharge status: Home / Self Care | Attending: Emergency Medicine | Admitting: Emergency Medicine

## 2024-03-06 ENCOUNTER — Encounter (HOSPITAL_COMMUNITY): Payer: Self-pay

## 2024-03-06 ENCOUNTER — Emergency Department (HOSPITAL_COMMUNITY)

## 2024-03-06 DIAGNOSIS — E1122 Type 2 diabetes mellitus with diabetic chronic kidney disease: Secondary | ICD-10-CM | POA: Insufficient documentation

## 2024-03-06 DIAGNOSIS — I129 Hypertensive chronic kidney disease with stage 1 through stage 4 chronic kidney disease, or unspecified chronic kidney disease: Secondary | ICD-10-CM | POA: Insufficient documentation

## 2024-03-06 DIAGNOSIS — F17210 Nicotine dependence, cigarettes, uncomplicated: Secondary | ICD-10-CM | POA: Insufficient documentation

## 2024-03-06 DIAGNOSIS — K5904 Chronic idiopathic constipation: Secondary | ICD-10-CM | POA: Insufficient documentation

## 2024-03-06 DIAGNOSIS — Z79899 Other long term (current) drug therapy: Secondary | ICD-10-CM | POA: Diagnosis not present

## 2024-03-06 DIAGNOSIS — R195 Other fecal abnormalities: Secondary | ICD-10-CM | POA: Diagnosis present

## 2024-03-06 DIAGNOSIS — K921 Melena: Secondary | ICD-10-CM

## 2024-03-06 DIAGNOSIS — N1832 Chronic kidney disease, stage 3b: Secondary | ICD-10-CM | POA: Diagnosis not present

## 2024-03-06 DIAGNOSIS — I2510 Atherosclerotic heart disease of native coronary artery without angina pectoris: Secondary | ICD-10-CM | POA: Diagnosis not present

## 2024-03-06 LAB — COMPREHENSIVE METABOLIC PANEL WITH GFR
ALT: 13 U/L (ref 0–44)
AST: 18 U/L (ref 15–41)
Albumin: 3.6 g/dL (ref 3.5–5.0)
Alkaline Phosphatase: 78 U/L (ref 38–126)
Anion gap: 9 (ref 5–15)
BUN: 16 mg/dL (ref 8–23)
CO2: 24 mmol/L (ref 22–32)
Calcium: 9.1 mg/dL (ref 8.9–10.3)
Chloride: 105 mmol/L (ref 98–111)
Creatinine, Ser: 1.89 mg/dL — ABNORMAL HIGH (ref 0.61–1.24)
GFR, Estimated: 35 mL/min — ABNORMAL LOW (ref >60)
Glucose, Bld: 104 mg/dL — ABNORMAL HIGH (ref 70–99)
Potassium: 4.7 mmol/L (ref 3.5–5.1)
Sodium: 138 mmol/L (ref 135–145)
Total Bilirubin: 0.7 mg/dL (ref 0.0–1.2)
Total Protein: 7.4 g/dL (ref 6.5–8.1)

## 2024-03-06 LAB — CBC WITH DIFFERENTIAL/PLATELET
Abs Immature Granulocytes: 0.01 K/uL (ref 0.00–0.07)
Basophils Absolute: 0 K/uL (ref 0.0–0.1)
Basophils Relative: 0 %
Eosinophils Absolute: 0 K/uL (ref 0.0–0.5)
Eosinophils Relative: 1 %
HCT: 36.3 % — ABNORMAL LOW (ref 39.0–52.0)
Hemoglobin: 11.6 g/dL — ABNORMAL LOW (ref 13.0–17.0)
Immature Granulocytes: 0 %
Lymphocytes Relative: 30 %
Lymphs Abs: 1.4 K/uL (ref 0.7–4.0)
MCH: 29.7 pg (ref 26.0–34.0)
MCHC: 32 g/dL (ref 30.0–36.0)
MCV: 92.8 fL (ref 80.0–100.0)
Monocytes Absolute: 0.5 K/uL (ref 0.1–1.0)
Monocytes Relative: 11 %
Neutro Abs: 2.8 K/uL (ref 1.7–7.7)
Neutrophils Relative %: 58 %
Platelets: 162 K/uL (ref 150–400)
RBC: 3.91 MIL/uL — ABNORMAL LOW (ref 4.22–5.81)
RDW: 14.6 % (ref 11.5–15.5)
WBC: 4.7 K/uL (ref 4.0–10.5)
nRBC: 0 % (ref 0.0–0.2)

## 2024-03-06 LAB — URINALYSIS, W/ REFLEX TO CULTURE (INFECTION SUSPECTED)
Bacteria, UA: NONE SEEN
Bilirubin Urine: NEGATIVE
Glucose, UA: NEGATIVE mg/dL
Hgb urine dipstick: NEGATIVE
Ketones, ur: NEGATIVE mg/dL
Leukocytes,Ua: NEGATIVE
Nitrite: NEGATIVE
Protein, ur: NEGATIVE mg/dL
Specific Gravity, Urine: 1.012 (ref 1.005–1.030)
pH: 5 (ref 5.0–8.0)

## 2024-03-06 LAB — TYPE AND SCREEN
ABO/RH(D): O POS
Antibody Screen: NEGATIVE

## 2024-03-06 LAB — LIPASE, BLOOD: Lipase: 30 U/L (ref 11–51)

## 2024-03-06 LAB — POC OCCULT BLOOD, ED: Fecal Occult Bld: NEGATIVE

## 2024-03-06 MED ORDER — ACETAMINOPHEN 500 MG PO TABS
1000.0000 mg | ORAL_TABLET | Freq: Once | ORAL | Status: AC
  Administered 2024-03-06: 1000 mg via ORAL
  Filled 2024-03-06: qty 2

## 2024-03-06 MED ORDER — GLYCERIN (LAXATIVE) 2 G RE SUPP
1.0000 | Freq: Once | RECTAL | Status: AC
  Administered 2024-03-06: 1 via RECTAL
  Filled 2024-03-06: qty 1

## 2024-03-06 MED ORDER — PEG 3350-KCL-NABCB-NACL-NASULF 236 G PO SOLR: 4000.0000 mL | Freq: Once | ORAL | 0 refills | Status: AC

## 2024-03-06 NOTE — Discharge Instructions (Addendum)
 It was a pleasure caring for you today in the emergency department.  You were seen for an episode of blood in your stool and your workup was thankfully negative for any GI bleed.  Your constipation is likely contributing to your symptoms as there could be internal hemorrhoids causing bleeding.  Please utilize the laxative cleanout at home to have a bowel movement.  Please follow-up with your PCP for continued symptoms or return to ED if worsening abdominal pain.  Please return to the emergency department for any worsening or worrisome symptoms.

## 2024-03-06 NOTE — ED Triage Notes (Signed)
 Pt states he has been constipated. He had a BM 2 days ago and since has noticed red blood in stool. Pt has lower abdominal pain, denies nausea or vomiting.

## 2024-03-06 NOTE — ED Provider Notes (Signed)
 [place] [facility]PARTMENT AT [facility] Provider Note  CSN: [id number] Arrival date & time: 03/06/24 1012  Chief Complaint(s) Blood In Stools  HPI Cristian Hayden is a 83 y.o. male with past medical history as below, significant for HTN, CKD 3B, T2DM, HLD, chronic anemia, tobacco use disorder who presents to the ED with complaint of blood in stool.  States he has a history of chronic constipation, last BM was 2 days ago and he noticed a couple drops of blood in the toilet bowl.  Did not note any blood with wiping or melanotic appearing stools.  Taking Dulcolax and MiraLAX  without relief.  Trying to drink a lot of water but has low appetite at baseline.  Noticed slightly decreased urine output.  Denies fever and N/V.  Denies chest pain or shortness of breath.  Denies prior history of GI bleed.  Not on blood thinners.  Last colonoscopy 10+ years ago.  Unsure if he has hemorrhoids.  Current smoker, half pack per day.  Denies alcohol or any other substance use.  Presents with daughter who provides additional history.   Past Medical History Past Medical History:  Diagnosis Date   Arthritis    Bronchitis    CAD (coronary artery disease)    Diabetes mellitus without complication (HCC)    Hyperlipidemia    Hypertension    Peripheral vascular disease    with claludication   Patient Active Problem List   Diagnosis Date Noted   Vertigo 04/16/2023   Acute ischemic stroke (HCC) 04/16/2023   CKD stage 3b, GFR 30-44 ml/min (HCC) 04/16/2023   Anemia of chronic disease 04/16/2023   Tobacco abuse 04/16/2023   History of colonic polyps 05/02/2021   Constipation 05/02/2021   Loss of appetite 05/02/2021   HLD (hyperlipidemia) 09/18/2020   Simple chronic bronchitis (HCC) 09/18/2020   Type 2 diabetes mellitus without complication 09/18/2020   Pain due to onychomycosis of toenails of both feet 06/18/2020   Diabetic neuropathy (HCC) 06/18/2020   Dyslipidemia 06/21/2019    Coronary artery disease 10/27/2018   Educated about COVID-19 virus infection 10/27/2018   DM2 (diabetes mellitus, type 2) (HCC) 10/27/2018   Pure hypercholesterolemia 10/27/2018   Essential hypertension 10/27/2018   Stomach pain 07/06/2014   Lumbago 07/06/2014   Central retinal artery occlusion of right eye    CAD (coronary artery disease), native coronary artery 12/15/2012   Abnormal EKG 05/25/2012   Numbness in left leg 03/08/2012   Peripheral vascular disease, unspecified 03/08/2012   Cold 03/08/2012   Chronic total occlusion of artery of the extremities 08/26/2011   Home Medication(s) Prior to Admission medications   Medication Sig Start Date End Date Taking? Authorizing Provider  polyethylene glycol (GOLYTELY ) 236 g solution Take 4,000 mLs by mouth once for 1 dose. 03/06/24 03/06/24 Yes Theophilus Pagan, MD  acetaminophen  (TYLENOL ) 325 MG tablet Take 2 tablets (650 mg total) by mouth every 6 (six) hours as needed for mild pain (pain score 1-3), fever or headache. 04/18/23   Danton Reyes DASEN, MD  amLODipine  (NORVASC ) 10 MG tablet Take 1 tablet (10 mg total) by mouth daily. 04/19/23   Danton Reyes DASEN, MD  atorvastatin  (LIPITOR) 40 MG tablet Take 40 mg by mouth daily.  06/02/16   [provider]  brimonidine  (ALPHAGAN ) 0.2 % ophthalmic solution Place 1 drop into both eyes 2 (two) times daily. 08/14/20   [provider]  dorzolamide -timolol  (COSOPT ) 22.3-6.8 MG/ML ophthalmic solution Place 1 drop into both eyes 2 (two) times  daily. 09/08/16   [provider]  isosorbide -hydrALAZINE  (BIDIL ) 20-37.5 MG tablet Take 1 tablet by mouth 3 (three) times daily. Patient not taking: Reported on 11/08/2023 04/18/23   Danton Reyes DASEN, MD  latanoprost  (XALATAN ) 0.005 % ophthalmic solution Place 1 drop into both eyes at bedtime. 09/08/16   [provider]  polyethylene glycol (MIRALAX ) 17 g packet Take 17 g by mouth daily. 04/24/23   Kingsley, Victoria K, DO   psyllium (METAMUCIL SMOOTH TEXTURE) 58.6 % powder Take 1 packet by mouth 3 (three) times daily. 04/24/23   Kingsley, Victoria K, DO  senna-docusate (SENOKOT-S) 8.6-50 MG tablet Take 1 tablet by mouth daily. 04/24/23   Kingsley, Victoria K, DO  tamsulosin (FLOMAX) 0.4 MG CAPS capsule Take 0.4 mg by mouth daily. Reported on 07/10/2015    [provider]                                                                                                                                    Past Surgical History Past Surgical History:  Procedure Laterality Date   CATARACT EXTRACTION  June 2013   Cataract Right eye   CORONARY ARTERY BYPASS GRAFT     LIMA to LAD, SVG to PDA, SVG to OM1/OM2   Femoral-Femoral BPGA  05/29/1999   Left profundoplasty  and Left Fem/Pop 05/28/2008   Family History Family History  Problem Relation Age of Onset   Hypertension Mother    Heart disease Father 77       CAD   Hypertension Father    Hypertension Sister    Hypertension Brother     Social History Social History   Tobacco Use   Smoking status: Some Days    Current packs/day: 0.25    Types: Cigarettes    Passive exposure: Past   Smokeless tobacco: Never   Tobacco comments:    pt states he is trying, only smokes about 2-3 per day  Vaping Use   Vaping status: Never Used  Substance Use Topics   Alcohol use: No    Comment: former   Drug use: No   Allergies Patient has no known allergies.  Review of Systems A thorough review of systems was obtained and all systems are negative except as noted in the HPI and PMH.   Physical Exam Vital Signs  I have reviewed the triage vital signs BP (!) 145/71   Pulse (!) 45   Temp (!) 97.5 F (36.4 C) (Oral)   Resp 18   Ht 5' 4 (1.626 m)   Wt 50.8 kg   SpO2 100%   BMI 19.22 kg/m  Physical Exam General: Well-appearing, thin elderly male. Alert. NAD HEENT: Normocephalic. White sclera. No rhinorrhea or congestion CV: RRR without murmur Pulm: CTAB.  Normal WOB on RA. No wheezing Abdomen: Soft, non-distended. +BS.  Mildly tender to palpation over lower abdomen.  No rebound tenderness or guarding. Rectal exam with minimal  stool in rectal vault, no frank bleeding noted.  Chaperoned by RN Richerd Cahill. Ext: Well perfused. Cap refill < 3 seconds Skin: Warm, dry. No rashes noted  ED Results and Treatments Labs (all labs ordered are listed, but only abnormal results are displayed) Labs Reviewed  COMPREHENSIVE METABOLIC PANEL WITH GFR - Abnormal; Notable for the following components:      Result Value   Creatinine, Ser 1.89 (*)    GFR, Estimated 35 (*)    All other components within normal limits  CBC WITH DIFFERENTIAL/PLATELET - Abnormal; Notable for the following components:   RBC 3.91 (*)    Hemoglobin 11.6 (*)    HCT 36.3 (*)    All other components within normal limits  LIPASE, BLOOD  URINALYSIS, W/ REFLEX TO CULTURE (INFECTION SUSPECTED)  POC OCCULT BLOOD, ED  TYPE AND SCREEN                                                                                                                          Radiology CT ABDOMEN PELVIS WO CONTRAST Result Date: 03/06/2024 CLINICAL DATA:  Acute, non localized lower abdominal pain with no bowel movement for the past 2 days. Small amount of blood in the stool for the past 2 days. EXAM: CT ABDOMEN AND PELVIS WITHOUT CONTRAST TECHNIQUE: Multidetector CT imaging of the abdomen and pelvis was performed following the standard protocol without IV contrast. RADIATION DOSE REDUCTION: This exam was performed according to the departmental dose-optimization program which includes automated exposure control, adjustment of the mA and/or kV according to patient size and/or use of iterative reconstruction technique. COMPARISON:  09/06/2022 FINDINGS: Lower chest: Mildly enlarged heart. Mild diffuse peribronchial thickening and accentuation of the interstitial markings. Minimal bibasilar atelectasis. Hepatobiliary:  Small gallstones in the dependent portion of the gallbladder measuring up to 5 mm in maximum diameter each. No gallbladder wall thickening or pericholecystic fluid. Unremarkable liver. Pancreas: Moderate pancreatic atrophy. Spleen: Normal in size without focal abnormality. Adrenals/Urinary Tract: Minimal urine in the urinary bladder. A previously demonstrated small simple appearing right renal cyst is unchanged. Unremarkable adrenal glands, left kidney and ureters. Stomach/Bowel: Stomach is within normal limits. Appendix appears normal. No evidence of bowel wall thickening, distention, or inflammatory changes. Vascular/Lymphatic: Atheromatous arterial calcifications without aneurysm. No enlarged lymph nodes. Reproductive: Stable moderately enlarged prostate gland. Other: No abdominal wall hernia or abnormality. No abdominopelvic ascites. 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 Lumbar spine degenerative changes and mild scoliosis. IMPRESSION: 1. No acute abnormality. 2. Cholelithiasis without evidence of cholecystitis. 3. Stable moderately enlarged prostate gland. 4. 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 5. Mild cardiomegaly. 6.  Calcific coronary artery and aortic atherosclerosis. 7. 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 Electronically Signed   By: Elspeth Bathe M.D.   On: 03/06/2024 12:32    Pertinent labs & imaging results that were available during my care of the patient were reviewed by me and  considered in my medical decision making (see MDM for details).  Medications Ordered in ED Medications  Glycerin (Adult) 2 g suppository 1 suppository (1 suppository Rectal Given 03/06/24 1236)  acetaminophen  (TYLENOL ) tablet 1,000 mg (1,000 mg Oral Given 03/06/24 1235)                                                                   Medical Decision Making / ED Course    Medical Decision Making:     KEIL PICKERING is a 83 y.o. male  with past medical history as below, significant for HTN, CKD 3B, T2DM, HLD, chronic anemia, tobacco use disorder presenting with constipation and blood in his stool x 1 event. The complaint involves an extensive differential diagnosis and also carries with it a high risk of complications and morbidity.  Serious etiology was considered. Ddx includes but is not limited to: Constipation, fecal impaction, bowel obstruction, GI bleed, malignancy  Complete initial physical exam performed, notably the patient was in no distress.    Reviewed and confirmed nursing documentation for past medical history, family history, social history.  Vital signs reviewed.   83 year old male with history as above presenting with constipation and 1 episode of blood in his stool occurring about 2 days ago.  CTAP without acute abnormality concerning for obstruction.  Rectal exam without fecal impaction or blood, FOBT negative. Not on blood thinners without history of GI bleed, lower concern for acute bleed more likely internal hemorrhoids versus fissure.  Given glycerin suppository without stool output, likely need home cleanout.  CBC with mild anemia, consistent with baseline.  UA negative.   Clinical Course as of 03/06/24 1344  Mon Mar 06, 2024  1343 SCr baseline for CKD3b.  Workup otherwise reassuring.  Discharged with bowel regimen and recommend outpatient follow-up with PCP. [KH]    Clinical Course User Index [KH] Theophilus Pagan, MD    Additional history obtained: -Additional history obtained from family -External records from outside source obtained and reviewed including: Chart review including previous notes, labs, imaging, consultation notes including: OP PCP notes   Lab Tests: -I ordered, reviewed, and interpreted labs.   The pertinent results include:   Labs Reviewed  COMPREHENSIVE METABOLIC PANEL WITH GFR - Abnormal; Notable for the following components:      Result  Value   Creatinine, Ser 1.89 (*)    GFR, Estimated 35 (*)    All other components within normal limits  CBC WITH DIFFERENTIAL/PLATELET - Abnormal; Notable for the following components:   RBC 3.91 (*)    Hemoglobin 11.6 (*)    HCT 36.3 (*)    All other components within normal limits  LIPASE, BLOOD  URINALYSIS, W/ REFLEX TO CULTURE (INFECTION SUSPECTED)  POC OCCULT BLOOD, ED  TYPE AND SCREEN    Imaging Studies ordered: I ordered imaging studies including: CTAP wo contrast I independently visualized the following imaging with scope of interpretation limited to determining acute life threatening conditions related to emergency care; findings noted above I agree with the radiologist interpretation If any imaging was obtained with contrast I closely monitored patient for any possible adverse reaction a/w contrast administration in the emergency department   Medicines ordered and prescription drug management: Meds ordered this encounter  Medications  Glycerin (Adult) 2 g suppository 1 suppository   acetaminophen  (TYLENOL ) tablet 1,000 mg   polyethylene glycol (GOLYTELY ) 236 g solution    Sig: Take 4,000 mLs by mouth once for 1 dose.    Dispense:  4000 mL    Refill:  0    -I have reviewed the patients home medicines and have made adjustments as needed  Cardiac Monitoring: The patient was maintained on a cardiac monitor.  I personally viewed and interpreted the cardiac monitored which showed an underlying rhythm of: NSR Continuous pulse oximetry interpreted by myself, 100% on RA.    Social Determinants of Health:  Diagnosis or treatment significantly limited by social determinants of health: current smoker   Reevaluation: After the interventions noted above, I reevaluated the patient and found that they have improved  Co morbidities that complicate the patient evaluation  Past Medical History:  Diagnosis Date   Arthritis    Bronchitis    CAD (coronary artery disease)     Diabetes mellitus without complication (HCC)    Hyperlipidemia    Hypertension    Peripheral vascular disease    with claludication      Dispostion: Disposition decision including need for hospitalization was considered, and patient discharged from emergency department.    Final Clinical Impression(s) / ED Diagnoses Final diagnoses:  Chronic idiopathic constipation  Blood in stool        Theophilus Pagan, MD 03/06/24 1414    Dean Clarity, MD 03/06/24 [phone]

## 2024-03-06 NOTE — ED Provider Triage Note (Signed)
 Emergency Medicine Provider Triage Evaluation Note  Cristian Hayden , a 83 y.o. male  was evaluated in triage.  Pt complains of low abdominal pain, no bowel movement x 2 days, last bowel movement had small amounts of blood in stool per report.  No vomiting.  No fever.  Patient with history of constipation, however, does not normally have abdominal pain with same.  History of renal dysfunction.  Baseline creatinine is 2, GFR historically is 30-40.  Baseline screening labs ordered.  Will order noncontrast CT abdomen pelvis at this time given history of renal dysfunction.  Review of Systems  Positive: Abdominal pain, no BM x 2 days Negative: Fever, vomiting  Physical Exam  BP (!) 166/79 (BP Location: Left Arm)   Pulse (!) 58   Temp 97.6 F (36.4 C) (Oral)   Resp 18   Ht 5' 4 (1.626 m)   Wt 50.8 kg   SpO2 100%   BMI 19.22 kg/m  Gen:   Awake, no distress   Resp:  Normal effort  MSK:   Moves extremities without difficulty  Other:    Medical Decision Making  Medically screening exam initiated at 11:11 AM.  Appropriate orders placed.  Cristian Hayden was informed that the remainder of the evaluation will be completed by another provider, this initial triage assessment does not replace that evaluation, and the importance of remaining in the ED until their evaluation is complete.     Laurice Maude BROCKS, MD 03/06/24 [phone]

## 2024-03-10 ENCOUNTER — Ambulatory Visit: Admitting: Podiatry

## 2024-05-03 ENCOUNTER — Ambulatory Visit: Admitting: Podiatry

## 2024-05-08 ENCOUNTER — Encounter: Payer: Self-pay | Admitting: Podiatry

## 2024-05-08 ENCOUNTER — Ambulatory Visit: Admitting: Podiatry

## 2024-05-08 DIAGNOSIS — B351 Tinea unguium: Secondary | ICD-10-CM

## 2024-05-08 DIAGNOSIS — I739 Peripheral vascular disease, unspecified: Secondary | ICD-10-CM | POA: Diagnosis not present

## 2024-05-08 DIAGNOSIS — E1142 Type 2 diabetes mellitus with diabetic polyneuropathy: Secondary | ICD-10-CM | POA: Diagnosis not present

## 2024-05-08 DIAGNOSIS — M79675 Pain in left toe(s): Secondary | ICD-10-CM

## 2024-05-08 DIAGNOSIS — M79674 Pain in right toe(s): Secondary | ICD-10-CM

## 2024-05-08 NOTE — Progress Notes (Signed)
 This patient returns to my office for at risk foot care.  This patient requires this care by a professional since this patient will be at risk due to having diabetes mellitus and pvd.    This patient is unable to cut nails himself since the patient cannot reach his nails.These nails are painful walking and wearing shoes.  This patient presents for at risk foot care today.  General Appearance  Alert, conversant and in no acute stress.  Vascular  Dorsalis pedis and posterior tibial  pulses are weakly  palpable  bilaterally.  Capillary return is within normal limits  bilaterally. Cold feet  Bilaterally. Absent digital hair  B/L.  Neurologic  Senn-Weinstein monofilament wire test within normal limits  bilaterally. Muscle power within normal limits bilaterally.  Nails Thick disfigured discolored nails with subungual debris  from hallux to fifth toes bilaterally. No evidence of bacterial infection or drainage bilaterally.  Orthopedic  No limitations of motion  feet .  No crepitus or effusions noted. HAV  B/L.  Skin  normotropic skin with no porokeratosis noted bilaterally.  No signs of infections or ulcers noted.     Onychomycosis  Pain in right toes  Pain in left toes  Consent was obtained for treatment procedures.   Mechanical debridement of nails 1-5  bilaterally performed with a nail nipper.  Filed with dremel without incident.    Return office visit     3  months                 Told patient to return for periodic foot care and evaluation due to potential at risk complications.   Cordella Bold DPM [place]

## 2024-08-07 ENCOUNTER — Ambulatory Visit: Admitting: Podiatry
